# Patient Record
Sex: Female | Born: 1935 | Race: Black or African American | Hispanic: No | Smoking: Current every day smoker
Health system: Southern US, Community
[De-identification: ages and names within clinical notes are randomized; demographics above are authoritative.]

## PROBLEM LIST (undated history)

## (undated) DIAGNOSIS — E039 Hypothyroidism, unspecified: Secondary | ICD-10-CM

## (undated) DIAGNOSIS — J439 Emphysema, unspecified: Secondary | ICD-10-CM

## (undated) DIAGNOSIS — I071 Rheumatic tricuspid insufficiency: Secondary | ICD-10-CM

## (undated) DIAGNOSIS — I10 Essential (primary) hypertension: Secondary | ICD-10-CM

## (undated) DIAGNOSIS — I2510 Atherosclerotic heart disease of native coronary artery without angina pectoris: Secondary | ICD-10-CM

## (undated) DIAGNOSIS — I6529 Occlusion and stenosis of unspecified carotid artery: Secondary | ICD-10-CM

## (undated) DIAGNOSIS — I2781 Cor pulmonale (chronic): Secondary | ICD-10-CM

## (undated) DIAGNOSIS — I272 Pulmonary hypertension, unspecified: Secondary | ICD-10-CM

## (undated) DIAGNOSIS — I700 Atherosclerosis of aorta: Secondary | ICD-10-CM

## (undated) HISTORY — PX: THYROIDECTOMY, PARTIAL: SHX18

## (undated) HISTORY — DX: Hypothyroidism, unspecified: E03.9

## (undated) HISTORY — DX: Atherosclerotic heart disease of native coronary artery without angina pectoris: I25.10

## (undated) HISTORY — DX: Occlusion and stenosis of unspecified carotid artery: I65.29

## (undated) HISTORY — DX: Essential (primary) hypertension: I10

## (undated) HISTORY — PX: TUBAL LIGATION: SHX77

## (undated) HISTORY — DX: Pulmonary hypertension, unspecified: I27.20

## (undated) HISTORY — DX: Emphysema, unspecified: J43.9

## (undated) HISTORY — DX: Rheumatic tricuspid insufficiency: I07.1

## (undated) HISTORY — DX: Cor pulmonale (chronic): I27.81

## (undated) HISTORY — DX: Atherosclerosis of aorta: I70.0

---

## 2001-09-23 ENCOUNTER — Ambulatory Visit (HOSPITAL_COMMUNITY): Admission: RE | Admit: 2001-09-23 | Discharge: 2001-09-23 | Payer: Self-pay | Admitting: Family Medicine

## 2001-09-23 ENCOUNTER — Encounter: Payer: Self-pay | Admitting: Family Medicine

## 2001-10-26 ENCOUNTER — Ambulatory Visit (HOSPITAL_COMMUNITY): Admission: RE | Admit: 2001-10-26 | Discharge: 2001-10-26 | Payer: Self-pay | Admitting: General Surgery

## 2003-03-01 ENCOUNTER — Encounter: Payer: Self-pay | Admitting: Family Medicine

## 2003-03-01 ENCOUNTER — Ambulatory Visit (HOSPITAL_COMMUNITY): Admission: RE | Admit: 2003-03-01 | Discharge: 2003-03-01 | Payer: Self-pay | Admitting: Family Medicine

## 2004-04-22 ENCOUNTER — Ambulatory Visit (HOSPITAL_COMMUNITY): Admission: RE | Admit: 2004-04-22 | Discharge: 2004-04-22 | Payer: Self-pay | Admitting: Family Medicine

## 2005-04-24 ENCOUNTER — Ambulatory Visit (HOSPITAL_COMMUNITY): Admission: RE | Admit: 2005-04-24 | Discharge: 2005-04-24 | Payer: Self-pay | Admitting: Family Medicine

## 2005-08-31 ENCOUNTER — Ambulatory Visit (HOSPITAL_COMMUNITY): Admission: RE | Admit: 2005-08-31 | Discharge: 2005-08-31 | Payer: Self-pay | Admitting: Family Medicine

## 2006-05-04 ENCOUNTER — Ambulatory Visit (HOSPITAL_COMMUNITY): Admission: RE | Admit: 2006-05-04 | Discharge: 2006-05-04 | Payer: Self-pay | Admitting: Family Medicine

## 2008-10-25 ENCOUNTER — Ambulatory Visit (HOSPITAL_COMMUNITY): Admission: RE | Admit: 2008-10-25 | Discharge: 2008-10-25 | Payer: Self-pay | Admitting: Ophthalmology

## 2008-12-13 ENCOUNTER — Ambulatory Visit (HOSPITAL_COMMUNITY): Admission: RE | Admit: 2008-12-13 | Discharge: 2008-12-13 | Payer: Self-pay | Admitting: Ophthalmology

## 2009-07-21 ENCOUNTER — Emergency Department (HOSPITAL_COMMUNITY): Admission: EM | Admit: 2009-07-21 | Discharge: 2009-07-21 | Payer: Self-pay | Admitting: Emergency Medicine

## 2010-01-07 ENCOUNTER — Ambulatory Visit (HOSPITAL_COMMUNITY): Admission: RE | Admit: 2010-01-07 | Discharge: 2010-01-07 | Payer: Self-pay | Admitting: Family Medicine

## 2010-03-17 ENCOUNTER — Ambulatory Visit (HOSPITAL_COMMUNITY): Admission: RE | Admit: 2010-03-17 | Discharge: 2010-03-17 | Payer: Self-pay | Admitting: Family Medicine

## 2010-03-27 ENCOUNTER — Encounter (INDEPENDENT_AMBULATORY_CARE_PROVIDER_SITE_OTHER): Payer: Self-pay | Admitting: Cardiology

## 2010-03-27 ENCOUNTER — Ambulatory Visit (HOSPITAL_COMMUNITY)
Admission: RE | Admit: 2010-03-27 | Discharge: 2010-03-27 | Payer: Self-pay | Source: Home / Self Care | Admitting: Cardiology

## 2010-04-03 ENCOUNTER — Other Ambulatory Visit: Admission: RE | Admit: 2010-04-03 | Discharge: 2010-04-03 | Payer: Self-pay | Admitting: Family Medicine

## 2010-04-07 ENCOUNTER — Ambulatory Visit (HOSPITAL_COMMUNITY): Admission: RE | Admit: 2010-04-07 | Discharge: 2010-04-07 | Payer: Self-pay | Admitting: Family Medicine

## 2010-05-01 ENCOUNTER — Encounter (HOSPITAL_COMMUNITY): Admission: RE | Admit: 2010-05-01 | Discharge: 2010-05-31 | Payer: Self-pay | Admitting: Cardiology

## 2010-12-14 ENCOUNTER — Encounter: Payer: Self-pay | Admitting: Cardiology

## 2011-02-28 LAB — BASIC METABOLIC PANEL
Calcium: 9.5 mg/dL (ref 8.4–10.5)
Creatinine, Ser: 0.75 mg/dL (ref 0.4–1.2)
GFR calc Af Amer: >60 mL/min (ref >60)
GFR calc non Af Amer: >60 mL/min (ref >60)
Potassium: 4.2 mEq/L (ref 3.5–5.1)

## 2011-02-28 LAB — CBC
HCT: 41.4 % (ref 36.0–46.0)
MCHC: 34.6 g/dL (ref 30.0–36.0)
RBC: 4.4 MIL/uL (ref 3.87–5.11)
RDW: 14.4 % (ref 11.5–15.5)
WBC: 7 10*3/uL (ref 4.0–10.5)

## 2011-02-28 LAB — DIFFERENTIAL
Basophils Absolute: 0 10*3/uL (ref 0.0–0.1)
Eosinophils Absolute: 0 10*3/uL (ref 0.0–0.7)
Eosinophils Relative: 1 % (ref 0–5)
Neutro Abs: 4.2 10*3/uL (ref 1.7–7.7)

## 2011-03-09 LAB — HEMOGLOBIN AND HEMATOCRIT, BLOOD
HCT: 45.1 % (ref 36.0–46.0)
Hemoglobin: 15.2 g/dL — ABNORMAL HIGH (ref 12.0–15.0)

## 2011-03-09 LAB — BASIC METABOLIC PANEL
BUN: 13 mg/dL (ref 6–23)
CO2: 26 mEq/L (ref 19–32)
GFR calc non Af Amer: >60 mL/min (ref >60)
Glucose, Bld: 92 mg/dL (ref 70–99)

## 2011-04-10 NOTE — H&P (Signed)
[facility]  Patient:    Michelle Wall, Michelle Wall Visit Number: [id number] MRN: [id number]          Service Type: OUT Location: RAD Attending Physician:  Evlyn Courier Dictated by:   Elpidio Anis, M.D. Admit Date:  09/23/2001 Discharge Date: 09/23/2001                           History and Physical  HISTORY OF PRESENT ILLNESS:  A 77 year old female referred for screening colonoscopy.  She has a negative family history.  She has no personal history of diarrhea or rectal bleeding.  There is no history of colon cancer.  PAST HISTORY: 1. Positive for hypertension. 2. Osteoarthritis. 3. Hypothyroidism.  SURGERY:  Right thyroid lobectomy.  MEDICATIONS:  Plendil, Benicar and Synthroid.  ALLERGIES:  None.  PHYSICAL EXAMINATION:  VITAL SIGNS:  Blood pressure 130/82, pulse 68, respirations 18, weight 149 pounds, height 5 feet 4 inches.  HEENT:  Unremarkable except for full dentures.  NECK:  Supple, no JVD or bruits.  CHEST:  Clear to auscultation; no rales, rubs, rhonchi or wheezes.  HEART:  Regular rate and rhythm without murmur, gallop or rub.  ABDOMEN:  Soft, nontender, no masses.  EXTREMITIES:  No cyanosis, clubbing or edema.  NEUROLOGIC EXAM:  Nonfocal.  IMPRESSION: 1. Need for screening colonoscopy. 2. Hypertension. 3. Osteoarthritis. 4. Hypothyroidism.  PLAN:  Total colonoscopy. Dictated by:   Elpidio Anis, M.D. Attending Physician:  Evlyn Courier DD:  10/26/01 TD:  10/26/01 Job: [id number]

## 2011-07-28 ENCOUNTER — Ambulatory Visit (HOSPITAL_COMMUNITY)
Admission: RE | Admit: 2011-07-28 | Discharge: 2011-07-28 | Disposition: A | Discharge status: Home / Self Care | Payer: PRIVATE HEALTH INSURANCE | Source: Ambulatory Visit | Attending: Family Medicine | Admitting: Family Medicine

## 2011-07-28 ENCOUNTER — Other Ambulatory Visit (HOSPITAL_COMMUNITY): Payer: Self-pay | Admitting: Family Medicine

## 2011-07-28 DIAGNOSIS — R079 Chest pain, unspecified: Secondary | ICD-10-CM | POA: Insufficient documentation

## 2011-07-28 DIAGNOSIS — R0781 Pleurodynia: Secondary | ICD-10-CM

## 2011-08-26 LAB — HEMOGLOBIN AND HEMATOCRIT, BLOOD
HCT: 43.7
Hemoglobin: 14.8

## 2011-08-26 LAB — BASIC METABOLIC PANEL
Creatinine, Ser: 0.79
GFR calc Af Amer: >60
Potassium: 4
Sodium: 137

## 2013-04-26 ENCOUNTER — Ambulatory Visit (INDEPENDENT_AMBULATORY_CARE_PROVIDER_SITE_OTHER): Payer: PRIVATE HEALTH INSURANCE | Admitting: Cardiovascular Disease

## 2013-04-26 VITALS — BP 112/78 | HR 74 | Resp 20 | Ht 62.75 in | Wt 138.6 lb

## 2013-04-26 DIAGNOSIS — I079 Rheumatic tricuspid valve disease, unspecified: Secondary | ICD-10-CM

## 2013-04-26 DIAGNOSIS — I272 Pulmonary hypertension, unspecified: Secondary | ICD-10-CM

## 2013-04-26 DIAGNOSIS — F17200 Nicotine dependence, unspecified, uncomplicated: Secondary | ICD-10-CM

## 2013-04-26 DIAGNOSIS — Z720 Tobacco use: Secondary | ICD-10-CM

## 2013-04-26 DIAGNOSIS — I2789 Other specified pulmonary heart diseases: Secondary | ICD-10-CM

## 2013-04-26 DIAGNOSIS — I10 Essential (primary) hypertension: Secondary | ICD-10-CM

## 2013-04-26 DIAGNOSIS — J449 Chronic obstructive pulmonary disease, unspecified: Secondary | ICD-10-CM

## 2013-04-26 DIAGNOSIS — I071 Rheumatic tricuspid insufficiency: Secondary | ICD-10-CM

## 2013-04-26 NOTE — Patient Instructions (Addendum)
Your physician recommends that you schedule a follow-up appointment in: 1 year  

## 2013-05-05 ENCOUNTER — Encounter: Payer: Self-pay | Admitting: Cardiovascular Disease

## 2013-05-16 ENCOUNTER — Encounter: Payer: Self-pay | Admitting: Cardiovascular Disease

## 2013-05-16 DIAGNOSIS — I272 Pulmonary hypertension, unspecified: Secondary | ICD-10-CM | POA: Insufficient documentation

## 2013-05-16 DIAGNOSIS — J449 Chronic obstructive pulmonary disease, unspecified: Secondary | ICD-10-CM | POA: Insufficient documentation

## 2013-05-16 DIAGNOSIS — I10 Essential (primary) hypertension: Secondary | ICD-10-CM | POA: Insufficient documentation

## 2013-05-16 DIAGNOSIS — Z720 Tobacco use: Secondary | ICD-10-CM | POA: Insufficient documentation

## 2013-05-16 DIAGNOSIS — I071 Rheumatic tricuspid insufficiency: Secondary | ICD-10-CM | POA: Insufficient documentation

## 2013-05-16 NOTE — Assessment & Plan Note (Signed)
It appears that all of [name] cardiac problems traced back to what is a roughly 80-pack-year history of smoking. This has led to significant COPD, pulmonary arterial hypertension, cor pulmonale and tricuspid insufficiency. The most recent echocardiogram estimated that her PA pressure however was normal and at least by physical exam today her tricuspid insufficiency is quite unimpressive. We spent about 10 minutes discussing the importance of smoking cessation for Michelle Wall very charming he told me that at her age it is unlikely she will ever quit smoking. She again asked me about using nicotine patches especially last year. These are I think safe in her case but will only be a small assistance to what has to be a committed effort on her part to quit smoking.

## 2013-05-16 NOTE — Progress Notes (Signed)
Patient ID: Michelle Wall, female   DOB: 02/06/1933, 79 y.o.   MRN: [id number]     Reason for office visit Tricuspid insufficiency Hypertension  Michelle Wall is doing quite well. Unfortunately ,she continues to smoke her usual half a pack of cigarettes a day. She has smoked for well over 50 years. She feels quite well and specifically denies cough, dyspnea, wheezing, hemoptysis, chest pain, lower extremity edema or other cardiovascular complaints   No Known Allergies  Current Outpatient Prescriptions  Medication Sig Dispense Refill  . aspirin 81 MG tablet Take 81 mg by mouth daily.      Marland Kitchen CALCIUM-VITAMIN D PO Take 1 tablet by mouth 2 (two) times daily.      . hydrochlorothiazide (HYDRODIURIL) 12.5 MG tablet Take 12.5 mg by mouth daily.      Marland Kitchen levothyroxine (SYNTHROID, LEVOTHROID) 100 MCG tablet Take 100 mcg by mouth daily before breakfast.       No current facility-administered medications for this visit.    Past Medical History  Diagnosis Date  . Emphysema   . Pulmonary hypertension     moderate  last 2D echo EF greater than 55%  mild to moderate tricuspid insufficiency  . Cor pulmonale   . Tricuspid insufficiency     moderate to severe  . Carotid atherosclerosis   . Hypertension   . Hypothyroidism     Past Surgical History  Procedure Laterality Date  . Thyroidectomy, partial      No family history on file.  History   Social History  . Marital Status: Widowed    Spouse Name: N/A    Number of Children: 8  . Years of Education: N/A   Occupational History  . Not on file.   Social History Main Topics  . Smoking status: Current Every Day Smoker  . Smokeless tobacco: Not on file     Comment: smokes 1/2 pack a day,  Has smoked for over 50 years  . Alcohol Use: No  . Drug Use: No  . Sexually Active: Not on file   Other Topics Concern  . Not on file   Social History Narrative  . No narrative on file    Review of systems: The patient specifically denies  any chest pain at rest or with exertion, dyspnea at rest or with exertion, orthopnea, paroxysmal nocturnal dyspnea, syncope, palpitations, focal neurological deficits, intermittent claudication, lower extremity edema, unexplained weight gain, cough, hemoptysis or wheezing.  The patient also denies abdominal pain, nausea, vomiting, dysphagia, diarrhea, constipation, polyuria, polydipsia, dysuria, hematuria, frequency, urgency, abnormal bleeding or bruising, fever, chills, unexpected weight changes, mood swings, change in skin or hair texture, change in voice quality, auditory or visual problems, allergic reactions or rashes, new musculoskeletal complaints other than usual "aches and pains".   PHYSICAL EXAM BP 112/78  Pulse 74  Resp 20  Ht 5' 2.75" (1.594 m)  Wt 62.869 kg (138 lb 9.6 oz)  BMI 24.74 kg/m2  General: Alert, oriented x3, no distress Head: no evidence of trauma, PERRL, EOMI, no exophtalmos or lid lag, no myxedema, no xanthelasma; normal ears, nose and oropharynx Neck: normal jugular venous pulsations and no hepatojugular reflux; brisk carotid pulses without delay and no carotid bruits Chest: clear to auscultation, no signs of consolidation by percussion or palpation, normal fremitus, symmetrical and full respiratory excursions Cardiovascular: normal position and quality of the apical impulse, regular rhythm, normal first and second heart sounds, 2/6 holosystolic murmur at the lower left sternal border, rubs or gallops  Abdomen: no tenderness or distention, no masses by palpation, no abnormal pulsatility or arterial bruits, normal bowel sounds, no hepatosplenomegaly Extremities: no clubbing, cyanosis or edema; 2+ radial, ulnar and brachial pulses bilaterally; 2+ right femoral, posterior tibial and dorsalis pedis pulses; 2+ left femoral, posterior tibial and dorsalis pedis pulses; no subclavian or femoral bruits Neurological: grossly nonfocal   EKG: Sinus rhythm with early RS transition  in V2 possibly due to ventricular hypertrophy  BMET    Component Value Date/Time   NA 136 07/21/2009 1100   K 4.2 07/21/2009 1100   CL 103 07/21/2009 1100   CO2 25 07/21/2009 1100   GLUCOSE 93 07/21/2009 1100   BUN 10 07/21/2009 1100   CREATININE 0.75 07/21/2009 1100   CALCIUM 9.5 07/21/2009 1100   GFRNONAA >60 07/21/2009 1100   GFRAA  Value: >60        The eGFR has been calculated using the MDRD equation. This calculation has not been validated in all clinical situations. eGFR's persistently <60 mL/min signify possible Chronic Kidney Disease. 07/21/2009 1100     ASSESSMENT AND PLAN Tobacco abuse It appears that all of Michelle Wall cardiac problems traced back to what is a roughly 80-pack-year history of smoking. This has led to significant COPD, pulmonary arterial hypertension, cor pulmonale and tricuspid insufficiency. The most recent echocardiogram estimated that her PA pressure however was normal and at least by physical exam today her tricuspid insufficiency is quite unimpressive. We spent about 10 minutes discussing the importance of smoking cessation for Michelle Wall very charming he told me that at her age it is unlikely she will ever quit smoking. She again asked me about using nicotine patches especially last year. These are I think safe in her case but will only be a small assistance to what has to be a committed effort on her part to quit smoking.  Essential hypertension Well controlled; no changes made to her medications  No orders of the defined types were placed in this encounter.   Meds ordered this encounter  Medications  . CALCIUM-VITAMIN D PO    Sig: Take 1 tablet by mouth 2 (two) times daily.    Junious Silk, MD, [facility] [phone] office [phone] pager

## 2013-05-16 NOTE — Assessment & Plan Note (Signed)
Well controlled; no changes made to her medications

## 2014-05-19 ENCOUNTER — Emergency Department (HOSPITAL_COMMUNITY): Payer: PRIVATE HEALTH INSURANCE

## 2014-05-19 ENCOUNTER — Encounter (HOSPITAL_COMMUNITY): Payer: Self-pay | Admitting: Emergency Medicine

## 2014-05-19 ENCOUNTER — Emergency Department (HOSPITAL_COMMUNITY)
Admission: EM | Admit: 2014-05-19 | Discharge: 2014-05-19 | Disposition: A | Discharge status: Home / Self Care | Payer: PRIVATE HEALTH INSURANCE | Attending: Emergency Medicine | Admitting: Emergency Medicine

## 2014-05-19 DIAGNOSIS — Z79899 Other long term (current) drug therapy: Secondary | ICD-10-CM | POA: Diagnosis not present

## 2014-05-19 DIAGNOSIS — E039 Hypothyroidism, unspecified: Secondary | ICD-10-CM | POA: Diagnosis not present

## 2014-05-19 DIAGNOSIS — Z7982 Long term (current) use of aspirin: Secondary | ICD-10-CM | POA: Diagnosis not present

## 2014-05-19 DIAGNOSIS — K1120 Sialoadenitis, unspecified: Secondary | ICD-10-CM | POA: Insufficient documentation

## 2014-05-19 DIAGNOSIS — I10 Essential (primary) hypertension: Secondary | ICD-10-CM | POA: Diagnosis not present

## 2014-05-19 DIAGNOSIS — F17200 Nicotine dependence, unspecified, uncomplicated: Secondary | ICD-10-CM | POA: Insufficient documentation

## 2014-05-19 DIAGNOSIS — J438 Other emphysema: Secondary | ICD-10-CM | POA: Diagnosis not present

## 2014-05-19 DIAGNOSIS — R220 Localized swelling, mass and lump, head: Secondary | ICD-10-CM | POA: Diagnosis present

## 2014-05-19 LAB — BASIC METABOLIC PANEL
BUN: 16 mg/dL (ref 6–23)
CALCIUM: 10.1 mg/dL (ref 8.4–10.5)
CO2: 27 meq/L (ref 19–32)
CREATININE: 0.77 mg/dL (ref 0.50–1.10)
Chloride: 101 mEq/L (ref 96–112)
GFR calc non Af Amer: 78 mL/min — ABNORMAL LOW (ref >90)
Glucose, Bld: 83 mg/dL (ref 70–99)
Potassium: 3.7 mEq/L (ref 3.7–5.3)
SODIUM: 141 meq/L (ref 137–147)

## 2014-05-19 LAB — CBC WITH DIFFERENTIAL/PLATELET
BASOS ABS: 0 10*3/uL (ref 0.0–0.1)
BASOS PCT: 0 % (ref 0–1)
EOS PCT: 2 % (ref 0–5)
Eosinophils Absolute: 0.2 10*3/uL (ref 0.0–0.7)
HCT: 44.8 % (ref 36.0–46.0)
Hemoglobin: 15.4 g/dL — ABNORMAL HIGH (ref 12.0–15.0)
Lymphocytes Relative: 30 % (ref 12–46)
Lymphs Abs: 2.2 10*3/uL (ref 0.7–4.0)
MCH: 31.8 pg (ref 26.0–34.0)
MCHC: 34.4 g/dL (ref 30.0–36.0)
MCV: 92.4 fL (ref 78.0–100.0)
MONO ABS: 0.6 10*3/uL (ref 0.1–1.0)
Monocytes Relative: 8 % (ref 3–12)
NEUTROS ABS: 4.5 10*3/uL (ref 1.7–7.7)
Neutrophils Relative %: 60 % (ref 43–77)
Platelets: 218 10*3/uL (ref 150–400)
RBC: 4.85 MIL/uL (ref 3.87–5.11)
RDW: 13.7 % (ref 11.5–15.5)
WBC: 7.4 10*3/uL (ref 4.0–10.5)

## 2014-05-19 LAB — AMYLASE: Amylase: 338 U/L — ABNORMAL HIGH (ref 0–105)

## 2014-05-19 MED ORDER — CLINDAMYCIN HCL 300 MG PO CAPS: 600.0000 mg | ORAL_CAPSULE | Freq: Three times a day (TID) | ORAL | Status: DC

## 2014-05-19 MED ORDER — IOHEXOL 300 MG/ML  SOLN
80.0000 mL | Freq: Once | INTRAMUSCULAR | Status: AC | PRN
  Administered 2014-05-19: 80 mL via INTRAVENOUS

## 2014-05-19 MED ORDER — CLINDAMYCIN PHOSPHATE 900 MG/50ML IV SOLN
900.0000 mg | Freq: Once | INTRAVENOUS | Status: AC
  Administered 2014-05-19: 900 mg via INTRAVENOUS
  Filled 2014-05-19: qty 50

## 2014-05-19 NOTE — ED Provider Notes (Signed)
CSN: [id number]     Arrival date & time 05/19/14  1403 History   First MD Initiated Contact with Patient 05/19/14 1539     Chief Complaint  Patient presents with  . Facial Swelling    (Consider location/radiation/quality/duration/timing/severity/associated sxs/prior Treatment) HPI  80 year old female with history of hypertension, hypothyroidism, pulmonary hypertension, cor pulmonale, and chronic tobacco abuse presenting with right neck swelling. Patient states that yesterday evening the right side of her neck and face started to swell. States this has happened before about once per year. She's not had any trouble breathing, shortness of breath, difficulty swallowing, difficulty hearing, difficulty seeing. The area is not painful other than feeling tight when it was at its most swollen. She states a little while ago she coughed out a white better tasting stone like item, at which point the swelling began to get better. She denies using any new medications or eating any new foods.  Past Medical History  Diagnosis Date  . Emphysema   . Pulmonary hypertension     moderate  last 2D echo EF greater than 55%  mild to moderate tricuspid insufficiency  . Cor pulmonale   . Tricuspid insufficiency     moderate to severe  . Carotid atherosclerosis   . Hypertension   . Hypothyroidism    Past Surgical History  Procedure Laterality Date  . Thyroidectomy, partial     Family History  Problem Relation Age of Onset  . Cancer Other    History  Substance Use Topics  . Smoking status: Current Every Day Smoker -- 1.00 packs/day for 60 years    Types: Cigarettes  . Smokeless tobacco: Never Used  . Alcohol Use: No   OB History   Grav Para Term Preterm Abortions TAB SAB Ect Mult Living   8 8 8       7      Review of Systems  Constitutional: Negative for fever.  HENT: Positive for facial swelling. Negative for sore throat, trouble swallowing and voice change.   Eyes: Negative for visual  disturbance.  Respiratory: Negative for shortness of breath.   Cardiovascular: Negative for chest pain and leg swelling.  Gastrointestinal: Negative for abdominal pain.  Genitourinary: Negative for difficulty urinating.  Musculoskeletal: Positive for neck pain.  All other systems reviewed and are negative.     Allergies  Review of patient's allergies indicates no known allergies.  Home Medications   Prior to Admission medications   Medication Sig Start Date End Date Taking? Authorizing Provider  aspirin EC 81 MG tablet Take 81 mg by mouth daily.   Yes Historical Provider, MD  CALCIUM-VITAMIN D PO Take 1 tablet by mouth 2 (two) times daily.   Yes Historical Provider, MD  hydrochlorothiazide (HYDRODIURIL) 12.5 MG tablet Take 12.5 mg by mouth daily.   Yes Historical Provider, MD  latanoprost (XALATAN) 0.005 % ophthalmic solution Place 1 drop into both eyes at bedtime. 04/17/14  Yes Historical Provider, MD  levothyroxine (SYNTHROID, LEVOTHROID) 150 MCG tablet Take 150 mcg by mouth daily before breakfast.   Yes Historical Provider, MD  lisinopril (PRINIVIL,ZESTRIL) 20 MG tablet Take 20 mg by mouth 2 (two) times daily.   Yes Historical Provider, MD  verapamil (CALAN-SR) 240 MG CR tablet Take 240 mg by mouth 2 (two) times daily.   Yes Historical Provider, MD   BP 161/77  Pulse 68  Temp(Src) 98.5 F (36.9 C) (Oral)  Resp 16  SpO2 98% Physical Exam  Constitutional: She is oriented to person, place, and  time. She appears well-developed and well-nourished. No distress.  HENT:  Head: Normocephalic and atraumatic.  Mouth/Throat: Oropharynx is clear and moist.  3-4 cm oval shaped area of focal swelling on R anterior aspect of neck. Mildly tender to palpation. Mobile. No erythema or skin breakdown. No warmth. No swelling of lips or face.  Eyes: EOM are normal.  Pupils reactive bilaterally. No scleral injection. EOMI.  Neck: Normal range of motion. No spinous process tenderness present. No  rigidity. Edema present. No erythema present. No Brudzinski's sign and no Kernig's sign noted.    Cardiovascular: Normal rate, regular rhythm and normal heart sounds.   No murmur heard. Pulmonary/Chest: Effort normal and breath sounds normal. No respiratory distress. She has no wheezes. She has no rales. She exhibits no tenderness.  Abdominal: Soft. Bowel sounds are normal. She exhibits no distension and no mass. There is no tenderness. There is no rebound and no guarding.  Neurological: She is alert and oriented to person, place, and time. No cranial nerve deficit.  Skin: Skin is warm and dry. She is not diaphoretic.  Psychiatric: She has a normal mood and affect. Her behavior is normal.    ED Course  Procedures (including critical care time) Labs Review Labs Reviewed  CBC WITH DIFFERENTIAL - Abnormal; Notable for the following:    Hemoglobin 15.4 (*)    All other components within normal limits  BASIC METABOLIC PANEL - Abnormal; Notable for the following:    GFR calc non Af Amer 78 (*)    All other components within normal limits  AMYLASE - Abnormal; Notable for the following:    Amylase 338 (*)    All other components within normal limits    Imaging Review No results found.   EKG Interpretation None      MDM   Final diagnoses:  Sialoadenitis of submandibular gland    80 year old female with right sided neck swelling, which per patient is actually improving now. No signs of airway compromise. This does not seem to be angioedema, although of note she is [place] and on an ACE inhibitor. Will check basic labs including CBC with differential, BMET, and amylase. Suspect this is a swollen gland.  Update: No leukocytosis to suggest infection. Her amylase is elevated. Will obtain CT scan of neck with contrast to further evaluate.  Update: CT scan showing submandibular sialoadenitis. Suspect this was related to a salivary gland stone, which has passed. She is clinically  well appearing, afebrile, and has no respiratory compromise or difficulty swallowing. Will treat with IV clindamycin x1 here in the emergency room, and then prescribe clindamycin to be taken orally as an outpatient. Patient was given information for ENT office followup, and instructed to call and schedule an appointment. She is agreeable to this plan.  Levert FeinsteinBrittany Anjani Feuerborn, MD Family Medicine PGY-2   Latrelle DodrillBrittany J Jolicia Delira, MD 05/19/14 2040  Latrelle DodrillBrittany J Neale Marzette, MD 05/19/14 [phone]

## 2014-05-19 NOTE — Discharge Instructions (Signed)
You were diagnosed with an infection of your submandibular gland. We have given a dose of IV antibiotics here in the emergency room. You will need to continue to take clindamycin by mouth after you go home. Please followup with the ENT doctor, the contact information is above. Return to the emergency room if you have any fevers, worsening swelling, difficulty breathing, inability to swallow, or any other concerns.

## 2014-05-19 NOTE — ED Notes (Signed)
Dr. McIntyre at bedside to assess pt.

## 2014-05-19 NOTE — ED Notes (Signed)
Patient c/o swelling to right side of neck. Per patient appeared yesterday. Denies any pain. Per patient has had this happen "a couple times before but swelling would go away on it's own." Denies any fevers, difficulty swallowing or breathing.

## 2014-05-20 NOTE — ED Provider Notes (Signed)
I saw and evaluated the patient, reviewed the resident's note and I agree with the findings and plan.   .Face to face Exam:  General:  Awake HEENT:  Atraumatic large neck swelling as noted Resp:  Normal effort Abd:  Nondistended Neuro:No focal weakness  Nelia Shiobert L Beaton, MD 05/20/14 1319

## 2014-09-24 ENCOUNTER — Encounter (HOSPITAL_COMMUNITY): Payer: Self-pay | Admitting: Emergency Medicine

## 2014-12-24 DIAGNOSIS — F17210 Nicotine dependence, cigarettes, uncomplicated: Secondary | ICD-10-CM | POA: Diagnosis not present

## 2014-12-24 DIAGNOSIS — Z0000 Encounter for general adult medical examination without abnormal findings: Secondary | ICD-10-CM | POA: Diagnosis not present

## 2014-12-24 DIAGNOSIS — I10 Essential (primary) hypertension: Secondary | ICD-10-CM | POA: Diagnosis not present

## 2015-02-01 DIAGNOSIS — E049 Nontoxic goiter, unspecified: Secondary | ICD-10-CM | POA: Diagnosis not present

## 2015-02-08 DIAGNOSIS — E063 Autoimmune thyroiditis: Secondary | ICD-10-CM | POA: Diagnosis not present

## 2015-02-08 DIAGNOSIS — J449 Chronic obstructive pulmonary disease, unspecified: Secondary | ICD-10-CM | POA: Diagnosis not present

## 2015-02-08 DIAGNOSIS — E042 Nontoxic multinodular goiter: Secondary | ICD-10-CM | POA: Diagnosis not present

## 2015-02-08 DIAGNOSIS — I10 Essential (primary) hypertension: Secondary | ICD-10-CM | POA: Diagnosis not present

## 2015-02-08 DIAGNOSIS — E890 Postprocedural hypothyroidism: Secondary | ICD-10-CM | POA: Diagnosis not present

## 2015-02-08 DIAGNOSIS — F17210 Nicotine dependence, cigarettes, uncomplicated: Secondary | ICD-10-CM | POA: Diagnosis not present

## 2015-04-08 DIAGNOSIS — J069 Acute upper respiratory infection, unspecified: Secondary | ICD-10-CM | POA: Diagnosis not present

## 2015-04-08 DIAGNOSIS — I10 Essential (primary) hypertension: Secondary | ICD-10-CM | POA: Diagnosis not present

## 2015-04-08 DIAGNOSIS — E785 Hyperlipidemia, unspecified: Secondary | ICD-10-CM | POA: Diagnosis not present

## 2015-04-08 DIAGNOSIS — E039 Hypothyroidism, unspecified: Secondary | ICD-10-CM | POA: Diagnosis not present

## 2015-04-29 DIAGNOSIS — H4011X2 Primary open-angle glaucoma, moderate stage: Secondary | ICD-10-CM | POA: Diagnosis not present

## 2015-04-29 DIAGNOSIS — H52223 Regular astigmatism, bilateral: Secondary | ICD-10-CM | POA: Diagnosis not present

## 2015-04-29 DIAGNOSIS — H524 Presbyopia: Secondary | ICD-10-CM | POA: Diagnosis not present

## 2015-04-29 DIAGNOSIS — H5202 Hypermetropia, left eye: Secondary | ICD-10-CM | POA: Diagnosis not present

## 2015-07-08 DIAGNOSIS — N182 Chronic kidney disease, stage 2 (mild): Secondary | ICD-10-CM | POA: Diagnosis not present

## 2015-07-08 DIAGNOSIS — I10 Essential (primary) hypertension: Secondary | ICD-10-CM | POA: Diagnosis not present

## 2015-07-08 DIAGNOSIS — E039 Hypothyroidism, unspecified: Secondary | ICD-10-CM | POA: Diagnosis not present

## 2015-10-07 DIAGNOSIS — E039 Hypothyroidism, unspecified: Secondary | ICD-10-CM | POA: Diagnosis not present

## 2015-10-07 DIAGNOSIS — I10 Essential (primary) hypertension: Secondary | ICD-10-CM | POA: Diagnosis not present

## 2015-10-07 DIAGNOSIS — Z23 Encounter for immunization: Secondary | ICD-10-CM | POA: Diagnosis not present

## 2015-10-07 DIAGNOSIS — Z6827 Body mass index (BMI) 27.0-27.9, adult: Secondary | ICD-10-CM | POA: Diagnosis not present

## 2015-10-29 DIAGNOSIS — H401122 Primary open-angle glaucoma, left eye, moderate stage: Secondary | ICD-10-CM | POA: Diagnosis not present

## 2015-10-29 DIAGNOSIS — H401113 Primary open-angle glaucoma, right eye, severe stage: Secondary | ICD-10-CM | POA: Diagnosis not present

## 2016-01-06 DIAGNOSIS — I10 Essential (primary) hypertension: Secondary | ICD-10-CM | POA: Diagnosis not present

## 2016-01-31 DIAGNOSIS — E890 Postprocedural hypothyroidism: Secondary | ICD-10-CM | POA: Diagnosis not present

## 2016-01-31 DIAGNOSIS — E063 Autoimmune thyroiditis: Secondary | ICD-10-CM | POA: Diagnosis not present

## 2016-02-07 DIAGNOSIS — I10 Essential (primary) hypertension: Secondary | ICD-10-CM | POA: Diagnosis not present

## 2016-02-07 DIAGNOSIS — E063 Autoimmune thyroiditis: Secondary | ICD-10-CM | POA: Diagnosis not present

## 2016-02-07 DIAGNOSIS — J449 Chronic obstructive pulmonary disease, unspecified: Secondary | ICD-10-CM | POA: Diagnosis not present

## 2016-02-07 DIAGNOSIS — E042 Nontoxic multinodular goiter: Secondary | ICD-10-CM | POA: Diagnosis not present

## 2016-02-07 DIAGNOSIS — F17210 Nicotine dependence, cigarettes, uncomplicated: Secondary | ICD-10-CM | POA: Diagnosis not present

## 2016-02-07 DIAGNOSIS — E785 Hyperlipidemia, unspecified: Secondary | ICD-10-CM | POA: Diagnosis not present

## 2016-02-07 DIAGNOSIS — E890 Postprocedural hypothyroidism: Secondary | ICD-10-CM | POA: Diagnosis not present

## 2016-04-13 DIAGNOSIS — E785 Hyperlipidemia, unspecified: Secondary | ICD-10-CM | POA: Diagnosis not present

## 2016-04-13 DIAGNOSIS — Z720 Tobacco use: Secondary | ICD-10-CM | POA: Diagnosis not present

## 2016-04-13 DIAGNOSIS — I10 Essential (primary) hypertension: Secondary | ICD-10-CM | POA: Diagnosis not present

## 2016-04-28 DIAGNOSIS — Z961 Presence of intraocular lens: Secondary | ICD-10-CM | POA: Diagnosis not present

## 2016-04-28 DIAGNOSIS — H401122 Primary open-angle glaucoma, left eye, moderate stage: Secondary | ICD-10-CM | POA: Diagnosis not present

## 2016-04-28 DIAGNOSIS — Z9849 Cataract extraction status, unspecified eye: Secondary | ICD-10-CM | POA: Diagnosis not present

## 2016-04-28 DIAGNOSIS — H401113 Primary open-angle glaucoma, right eye, severe stage: Secondary | ICD-10-CM | POA: Diagnosis not present

## 2016-07-22 DIAGNOSIS — I10 Essential (primary) hypertension: Secondary | ICD-10-CM | POA: Diagnosis not present

## 2016-07-22 DIAGNOSIS — Z720 Tobacco use: Secondary | ICD-10-CM | POA: Diagnosis not present

## 2016-07-22 DIAGNOSIS — E039 Hypothyroidism, unspecified: Secondary | ICD-10-CM | POA: Diagnosis not present

## 2016-07-22 DIAGNOSIS — Z0000 Encounter for general adult medical examination without abnormal findings: Secondary | ICD-10-CM | POA: Diagnosis not present

## 2016-11-09 DIAGNOSIS — H401122 Primary open-angle glaucoma, left eye, moderate stage: Secondary | ICD-10-CM | POA: Diagnosis not present

## 2016-11-09 DIAGNOSIS — H401113 Primary open-angle glaucoma, right eye, severe stage: Secondary | ICD-10-CM | POA: Diagnosis not present

## 2016-11-18 DIAGNOSIS — Z720 Tobacco use: Secondary | ICD-10-CM | POA: Diagnosis not present

## 2016-11-18 DIAGNOSIS — E039 Hypothyroidism, unspecified: Secondary | ICD-10-CM | POA: Diagnosis not present

## 2016-11-18 DIAGNOSIS — I10 Essential (primary) hypertension: Secondary | ICD-10-CM | POA: Diagnosis not present

## 2016-11-18 DIAGNOSIS — Z23 Encounter for immunization: Secondary | ICD-10-CM | POA: Diagnosis not present

## 2017-01-29 DIAGNOSIS — E890 Postprocedural hypothyroidism: Secondary | ICD-10-CM | POA: Diagnosis not present

## 2017-01-29 DIAGNOSIS — E063 Autoimmune thyroiditis: Secondary | ICD-10-CM | POA: Diagnosis not present

## 2017-02-05 DIAGNOSIS — I10 Essential (primary) hypertension: Secondary | ICD-10-CM | POA: Diagnosis not present

## 2017-02-05 DIAGNOSIS — E890 Postprocedural hypothyroidism: Secondary | ICD-10-CM | POA: Diagnosis not present

## 2017-02-05 DIAGNOSIS — E042 Nontoxic multinodular goiter: Secondary | ICD-10-CM | POA: Diagnosis not present

## 2017-03-22 DIAGNOSIS — E039 Hypothyroidism, unspecified: Secondary | ICD-10-CM | POA: Diagnosis not present

## 2017-03-22 DIAGNOSIS — E785 Hyperlipidemia, unspecified: Secondary | ICD-10-CM | POA: Diagnosis not present

## 2017-03-22 DIAGNOSIS — I10 Essential (primary) hypertension: Secondary | ICD-10-CM | POA: Diagnosis not present

## 2017-05-03 DIAGNOSIS — E063 Autoimmune thyroiditis: Secondary | ICD-10-CM | POA: Diagnosis not present

## 2017-05-03 DIAGNOSIS — E042 Nontoxic multinodular goiter: Secondary | ICD-10-CM | POA: Diagnosis not present

## 2017-05-03 DIAGNOSIS — I10 Essential (primary) hypertension: Secondary | ICD-10-CM | POA: Diagnosis not present

## 2017-05-03 DIAGNOSIS — E890 Postprocedural hypothyroidism: Secondary | ICD-10-CM | POA: Diagnosis not present

## 2017-05-10 DIAGNOSIS — I10 Essential (primary) hypertension: Secondary | ICD-10-CM | POA: Diagnosis not present

## 2017-05-10 DIAGNOSIS — E042 Nontoxic multinodular goiter: Secondary | ICD-10-CM | POA: Diagnosis not present

## 2017-05-10 DIAGNOSIS — J449 Chronic obstructive pulmonary disease, unspecified: Secondary | ICD-10-CM | POA: Diagnosis not present

## 2017-05-25 DIAGNOSIS — H401113 Primary open-angle glaucoma, right eye, severe stage: Secondary | ICD-10-CM | POA: Diagnosis not present

## 2017-05-25 DIAGNOSIS — H401122 Primary open-angle glaucoma, left eye, moderate stage: Secondary | ICD-10-CM | POA: Diagnosis not present

## 2017-08-04 DIAGNOSIS — E785 Hyperlipidemia, unspecified: Secondary | ICD-10-CM | POA: Diagnosis not present

## 2017-08-04 DIAGNOSIS — Z720 Tobacco use: Secondary | ICD-10-CM | POA: Diagnosis not present

## 2017-08-04 DIAGNOSIS — I10 Essential (primary) hypertension: Secondary | ICD-10-CM | POA: Diagnosis not present

## 2017-08-04 DIAGNOSIS — E038 Other specified hypothyroidism: Secondary | ICD-10-CM | POA: Diagnosis not present

## 2017-08-11 DIAGNOSIS — E890 Postprocedural hypothyroidism: Secondary | ICD-10-CM | POA: Diagnosis not present

## 2017-08-11 DIAGNOSIS — E063 Autoimmune thyroiditis: Secondary | ICD-10-CM | POA: Diagnosis not present

## 2017-08-18 DIAGNOSIS — J449 Chronic obstructive pulmonary disease, unspecified: Secondary | ICD-10-CM | POA: Diagnosis not present

## 2017-08-18 DIAGNOSIS — I10 Essential (primary) hypertension: Secondary | ICD-10-CM | POA: Diagnosis not present

## 2017-08-18 DIAGNOSIS — E785 Hyperlipidemia, unspecified: Secondary | ICD-10-CM | POA: Diagnosis not present

## 2017-08-18 DIAGNOSIS — E890 Postprocedural hypothyroidism: Secondary | ICD-10-CM | POA: Diagnosis not present

## 2017-11-03 DIAGNOSIS — Z720 Tobacco use: Secondary | ICD-10-CM | POA: Diagnosis not present

## 2017-11-03 DIAGNOSIS — E785 Hyperlipidemia, unspecified: Secondary | ICD-10-CM | POA: Diagnosis not present

## 2017-11-03 DIAGNOSIS — I10 Essential (primary) hypertension: Secondary | ICD-10-CM | POA: Diagnosis not present

## 2017-11-10 DIAGNOSIS — H401113 Primary open-angle glaucoma, right eye, severe stage: Secondary | ICD-10-CM | POA: Diagnosis not present

## 2017-11-10 DIAGNOSIS — H401122 Primary open-angle glaucoma, left eye, moderate stage: Secondary | ICD-10-CM | POA: Diagnosis not present

## 2018-02-07 DIAGNOSIS — E039 Hypothyroidism, unspecified: Secondary | ICD-10-CM | POA: Diagnosis not present

## 2018-02-07 DIAGNOSIS — Z720 Tobacco use: Secondary | ICD-10-CM | POA: Diagnosis not present

## 2018-02-07 DIAGNOSIS — I10 Essential (primary) hypertension: Secondary | ICD-10-CM | POA: Diagnosis not present

## 2018-02-07 DIAGNOSIS — K1321 Leukoplakia of oral mucosa, including tongue: Secondary | ICD-10-CM | POA: Diagnosis not present

## 2018-02-08 DIAGNOSIS — E890 Postprocedural hypothyroidism: Secondary | ICD-10-CM | POA: Diagnosis not present

## 2018-02-08 DIAGNOSIS — E063 Autoimmune thyroiditis: Secondary | ICD-10-CM | POA: Diagnosis not present

## 2018-02-08 DIAGNOSIS — I10 Essential (primary) hypertension: Secondary | ICD-10-CM | POA: Diagnosis not present

## 2018-02-15 DIAGNOSIS — E042 Nontoxic multinodular goiter: Secondary | ICD-10-CM | POA: Diagnosis not present

## 2018-02-15 DIAGNOSIS — E063 Autoimmune thyroiditis: Secondary | ICD-10-CM | POA: Diagnosis not present

## 2018-02-15 DIAGNOSIS — I10 Essential (primary) hypertension: Secondary | ICD-10-CM | POA: Diagnosis not present

## 2018-02-15 DIAGNOSIS — J449 Chronic obstructive pulmonary disease, unspecified: Secondary | ICD-10-CM | POA: Diagnosis not present

## 2018-02-15 DIAGNOSIS — E785 Hyperlipidemia, unspecified: Secondary | ICD-10-CM | POA: Diagnosis not present

## 2018-02-15 DIAGNOSIS — E890 Postprocedural hypothyroidism: Secondary | ICD-10-CM | POA: Diagnosis not present

## 2018-05-09 DIAGNOSIS — Z720 Tobacco use: Secondary | ICD-10-CM | POA: Diagnosis not present

## 2018-05-09 DIAGNOSIS — I119 Hypertensive heart disease without heart failure: Secondary | ICD-10-CM | POA: Diagnosis not present

## 2018-05-09 DIAGNOSIS — I10 Essential (primary) hypertension: Secondary | ICD-10-CM | POA: Diagnosis not present

## 2018-05-09 DIAGNOSIS — K1321 Leukoplakia of oral mucosa, including tongue: Secondary | ICD-10-CM | POA: Diagnosis not present

## 2018-05-11 DIAGNOSIS — H401113 Primary open-angle glaucoma, right eye, severe stage: Secondary | ICD-10-CM | POA: Diagnosis not present

## 2018-05-11 DIAGNOSIS — H401122 Primary open-angle glaucoma, left eye, moderate stage: Secondary | ICD-10-CM | POA: Diagnosis not present

## 2018-06-14 ENCOUNTER — Emergency Department (HOSPITAL_COMMUNITY): Payer: Medicare Other

## 2018-06-14 ENCOUNTER — Other Ambulatory Visit: Payer: Self-pay

## 2018-06-14 ENCOUNTER — Encounter (HOSPITAL_COMMUNITY)
Admission: EM | Disposition: A | Discharge status: Home Health | Payer: Self-pay | Source: Home / Self Care | Attending: Internal Medicine

## 2018-06-14 ENCOUNTER — Emergency Department (HOSPITAL_COMMUNITY): Payer: Medicare Other | Admitting: Anesthesiology

## 2018-06-14 ENCOUNTER — Inpatient Hospital Stay (HOSPITAL_COMMUNITY)
Admission: EM | Admit: 2018-06-14 | Discharge: 2018-06-22 | DRG: 853 | Disposition: A | Discharge status: Home Health | Payer: Medicare Other | Attending: Internal Medicine | Admitting: Internal Medicine

## 2018-06-14 ENCOUNTER — Encounter (HOSPITAL_COMMUNITY): Payer: Self-pay | Admitting: Emergency Medicine

## 2018-06-14 DIAGNOSIS — I2781 Cor pulmonale (chronic): Secondary | ICD-10-CM | POA: Diagnosis present

## 2018-06-14 DIAGNOSIS — R188 Other ascites: Secondary | ICD-10-CM | POA: Diagnosis not present

## 2018-06-14 DIAGNOSIS — K659 Peritonitis, unspecified: Secondary | ICD-10-CM | POA: Diagnosis not present

## 2018-06-14 DIAGNOSIS — K5720 Diverticulitis of large intestine with perforation and abscess without bleeding: Secondary | ICD-10-CM | POA: Diagnosis not present

## 2018-06-14 DIAGNOSIS — I071 Rheumatic tricuspid insufficiency: Secondary | ICD-10-CM | POA: Diagnosis not present

## 2018-06-14 DIAGNOSIS — I272 Pulmonary hypertension, unspecified: Secondary | ICD-10-CM

## 2018-06-14 DIAGNOSIS — I6529 Occlusion and stenosis of unspecified carotid artery: Secondary | ICD-10-CM | POA: Diagnosis present

## 2018-06-14 DIAGNOSIS — R109 Unspecified abdominal pain: Secondary | ICD-10-CM | POA: Diagnosis not present

## 2018-06-14 DIAGNOSIS — K567 Ileus, unspecified: Secondary | ICD-10-CM | POA: Diagnosis not present

## 2018-06-14 DIAGNOSIS — Z79890 Hormone replacement therapy (postmenopausal): Secondary | ICD-10-CM

## 2018-06-14 DIAGNOSIS — T17890A Other foreign object in other parts of respiratory tract causing asphyxiation, initial encounter: Secondary | ICD-10-CM | POA: Diagnosis not present

## 2018-06-14 DIAGNOSIS — H409 Unspecified glaucoma: Secondary | ICD-10-CM | POA: Diagnosis present

## 2018-06-14 DIAGNOSIS — Z452 Encounter for adjustment and management of vascular access device: Secondary | ICD-10-CM | POA: Diagnosis not present

## 2018-06-14 DIAGNOSIS — Z7982 Long term (current) use of aspirin: Secondary | ICD-10-CM | POA: Diagnosis not present

## 2018-06-14 DIAGNOSIS — E039 Hypothyroidism, unspecified: Secondary | ICD-10-CM | POA: Diagnosis not present

## 2018-06-14 DIAGNOSIS — E890 Postprocedural hypothyroidism: Secondary | ICD-10-CM | POA: Diagnosis not present

## 2018-06-14 DIAGNOSIS — Z79899 Other long term (current) drug therapy: Secondary | ICD-10-CM | POA: Diagnosis not present

## 2018-06-14 DIAGNOSIS — K658 Other peritonitis: Secondary | ICD-10-CM | POA: Diagnosis not present

## 2018-06-14 DIAGNOSIS — R000 Tachycardia, unspecified: Secondary | ICD-10-CM | POA: Diagnosis not present

## 2018-06-14 DIAGNOSIS — K631 Perforation of intestine (nontraumatic): Secondary | ICD-10-CM | POA: Diagnosis present

## 2018-06-14 DIAGNOSIS — I10 Essential (primary) hypertension: Secondary | ICD-10-CM | POA: Diagnosis present

## 2018-06-14 DIAGNOSIS — F17210 Nicotine dependence, cigarettes, uncomplicated: Secondary | ICD-10-CM | POA: Diagnosis present

## 2018-06-14 DIAGNOSIS — Z23 Encounter for immunization: Secondary | ICD-10-CM

## 2018-06-14 DIAGNOSIS — I2729 Other secondary pulmonary hypertension: Secondary | ICD-10-CM | POA: Diagnosis present

## 2018-06-14 DIAGNOSIS — J449 Chronic obstructive pulmonary disease, unspecified: Secondary | ICD-10-CM | POA: Diagnosis not present

## 2018-06-14 DIAGNOSIS — A419 Sepsis, unspecified organism: Secondary | ICD-10-CM | POA: Diagnosis not present

## 2018-06-14 DIAGNOSIS — E876 Hypokalemia: Secondary | ICD-10-CM | POA: Diagnosis not present

## 2018-06-14 DIAGNOSIS — I728 Aneurysm of other specified arteries: Secondary | ICD-10-CM | POA: Diagnosis not present

## 2018-06-14 DIAGNOSIS — Z720 Tobacco use: Secondary | ICD-10-CM | POA: Diagnosis not present

## 2018-06-14 DIAGNOSIS — J9811 Atelectasis: Secondary | ICD-10-CM | POA: Diagnosis not present

## 2018-06-14 DIAGNOSIS — Z01818 Encounter for other preprocedural examination: Secondary | ICD-10-CM

## 2018-06-14 HISTORY — PX: PARTIAL COLECTOMY: SHX5273

## 2018-06-14 HISTORY — PX: COLOSTOMY: SHX63

## 2018-06-14 LAB — COMPREHENSIVE METABOLIC PANEL
ALT: 13 U/L (ref 0–44)
AST: 17 U/L (ref 15–41)
Albumin: 3.9 g/dL (ref 3.5–5.0)
Alkaline Phosphatase: 41 U/L (ref 38–126)
Anion gap: 10 (ref 5–15)
BUN: 14 mg/dL (ref 8–23)
CO2: 25 mmol/L (ref 22–32)
Calcium: 9.2 mg/dL (ref 8.9–10.3)
Chloride: 100 mmol/L (ref 98–111)
Creatinine, Ser: 0.75 mg/dL (ref 0.44–1.00)
GFR calc non Af Amer: >60 mL/min (ref >60)
Glucose, Bld: 140 mg/dL — ABNORMAL HIGH (ref 70–99)
Potassium: 3.1 mmol/L — ABNORMAL LOW (ref 3.5–5.1)
Sodium: 135 mmol/L (ref 135–145)
Total Bilirubin: 0.9 mg/dL (ref 0.3–1.2)
Total Protein: 7.7 g/dL (ref 6.5–8.1)

## 2018-06-14 LAB — CBC
HCT: 46.3 % — ABNORMAL HIGH (ref 36.0–46.0)
Hemoglobin: 16 g/dL — ABNORMAL HIGH (ref 12.0–15.0)
MCH: 31.9 pg (ref 26.0–34.0)
MCHC: 34.6 g/dL (ref 30.0–36.0)
MCV: 92.2 fL (ref 78.0–100.0)
PLATELETS: 231 10*3/uL (ref 150–400)
RBC: 5.02 MIL/uL (ref 3.87–5.11)
RDW: 14.2 % (ref 11.5–15.5)
WBC: 6.1 10*3/uL (ref 4.0–10.5)

## 2018-06-14 LAB — PROTIME-INR
INR: 1.32
Prothrombin Time: 16.3 seconds — ABNORMAL HIGH (ref 11.4–15.2)

## 2018-06-14 LAB — APTT: APTT: 24 s (ref 24–36)

## 2018-06-14 LAB — TYPE AND SCREEN
ABO/RH(D): O POS
Antibody Screen: NEGATIVE

## 2018-06-14 LAB — LIPASE, BLOOD: Lipase: 28 U/L (ref 11–51)

## 2018-06-14 LAB — LACTIC ACID, PLASMA: Lactic Acid, Venous: 3.7 mmol/L (ref 0.5–1.9)

## 2018-06-14 SURGERY — COLECTOMY, PARTIAL
Anesthesia: General | Site: Abdomen

## 2018-06-14 MED ORDER — 0.9 % SODIUM CHLORIDE (POUR BTL) OPTIME
TOPICAL | Status: DC | PRN
  Administered 2018-06-14: 1000 mL

## 2018-06-14 MED ORDER — PHENYLEPHRINE HCL 10 MG/ML IJ SOLN
INTRAMUSCULAR | Status: DC | PRN
  Administered 2018-06-14: 200 ug via INTRAVENOUS
  Administered 2018-06-14: 100 ug via INTRAVENOUS
  Administered 2018-06-14: 300 ug via INTRAVENOUS
  Administered 2018-06-14: 200 ug via INTRAVENOUS
  Administered 2018-06-14: 300 ug via INTRAVENOUS
  Administered 2018-06-14: 100 ug via INTRAVENOUS
  Administered 2018-06-14: 300 ug via INTRAVENOUS
  Administered 2018-06-14: 100 ug via INTRAVENOUS

## 2018-06-14 MED ORDER — FENTANYL CITRATE (PF) 100 MCG/2ML IJ SOLN
50.0000 ug | INTRAMUSCULAR | Status: DC | PRN
  Administered 2018-06-15: 50 ug via INTRAVENOUS
  Filled 2018-06-14: qty 2

## 2018-06-14 MED ORDER — HYDROMORPHONE HCL 1 MG/ML IJ SOLN
0.5000 mg | Freq: Once | INTRAMUSCULAR | Status: AC
Start: 2018-06-14 — End: 2018-06-14
  Administered 2018-06-14: 0.5 mg via INTRAVENOUS
  Filled 2018-06-14: qty 1

## 2018-06-14 MED ORDER — PROPOFOL 10 MG/ML IV BOLUS
INTRAVENOUS | Status: AC
  Filled 2018-06-14: qty 20

## 2018-06-14 MED ORDER — LEVOTHYROXINE SODIUM 100 MCG IV SOLR
50.0000 ug | Freq: Every day | INTRAVENOUS | Status: DC
  Administered 2018-06-15 – 2018-06-20 (×6): 50 ug via INTRAVENOUS
  Filled 2018-06-14 (×8): qty 5

## 2018-06-14 MED ORDER — FENTANYL CITRATE (PF) 100 MCG/2ML IJ SOLN
50.0000 ug | Freq: Once | INTRAMUSCULAR | Status: AC
Start: 2018-06-14 — End: 2018-06-14
  Administered 2018-06-14: 50 ug via INTRAVENOUS
  Filled 2018-06-14: qty 2

## 2018-06-14 MED ORDER — NEOSTIGMINE METHYLSULFATE 10 MG/10ML IV SOLN
INTRAVENOUS | Status: DC | PRN
  Administered 2018-06-14: 3 mg via INTRAVENOUS

## 2018-06-14 MED ORDER — VECURONIUM BROMIDE 10 MG IV SOLR
INTRAVENOUS | Status: AC
  Filled 2018-06-14: qty 10

## 2018-06-14 MED ORDER — MORPHINE SULFATE (PF) 2 MG/ML IV SOLN
2.0000 mg | INTRAVENOUS | Status: DC | PRN
  Administered 2018-06-15: 2 mg via INTRAVENOUS
  Filled 2018-06-14: qty 1

## 2018-06-14 MED ORDER — PROPOFOL 10 MG/ML IV BOLUS
INTRAVENOUS | Status: DC | PRN
  Administered 2018-06-14: 100 mg via INTRAVENOUS

## 2018-06-14 MED ORDER — ONDANSETRON HCL 4 MG/2ML IJ SOLN: 4.0000 mg | Freq: Once | INTRAMUSCULAR | Status: DC | PRN

## 2018-06-14 MED ORDER — SUCCINYLCHOLINE CHLORIDE 20 MG/ML IJ SOLN
INTRAMUSCULAR | Status: DC | PRN
  Administered 2018-06-14: 100 mg via INTRAVENOUS

## 2018-06-14 MED ORDER — SUCCINYLCHOLINE CHLORIDE 20 MG/ML IJ SOLN
INTRAMUSCULAR | Status: AC
  Filled 2018-06-14: qty 1

## 2018-06-14 MED ORDER — MEPERIDINE HCL 50 MG/ML IJ SOLN: 6.2500 mg | INTRAMUSCULAR | Status: DC | PRN

## 2018-06-14 MED ORDER — ONDANSETRON HCL 4 MG/2ML IJ SOLN
INTRAMUSCULAR | Status: DC | PRN
  Administered 2018-06-14: 4 mg via INTRAVENOUS

## 2018-06-14 MED ORDER — LACTATED RINGERS IV SOLN: INTRAVENOUS | Status: DC

## 2018-06-14 MED ORDER — DEXMEDETOMIDINE HCL 200 MCG/2ML IV SOLN
INTRAVENOUS | Status: AC
  Filled 2018-06-14: qty 2

## 2018-06-14 MED ORDER — DEXAMETHASONE SODIUM PHOSPHATE 4 MG/ML IJ SOLN
INTRAMUSCULAR | Status: DC | PRN
  Administered 2018-06-14: 8 mg via INTRAVENOUS

## 2018-06-14 MED ORDER — PHENYLEPHRINE HCL 10 MG/ML IJ SOLN
INTRAMUSCULAR | Status: AC
  Filled 2018-06-14: qty 1

## 2018-06-14 MED ORDER — SODIUM CHLORIDE 0.9 % IV SOLN
INTRAVENOUS | Status: DC | PRN
  Administered 2018-06-14: 20:00:00 via INTRAVENOUS
  Administered 2018-06-14: 1 g via INTRAVENOUS

## 2018-06-14 MED ORDER — VECURONIUM BROMIDE 10 MG IV SOLR
INTRAVENOUS | Status: DC | PRN
  Administered 2018-06-14: 4 mg via INTRAVENOUS

## 2018-06-14 MED ORDER — IOPAMIDOL (ISOVUE-300) INJECTION 61%
100.0000 mL | Freq: Once | INTRAVENOUS | Status: AC | PRN
  Administered 2018-06-14: 100 mL via INTRAVENOUS

## 2018-06-14 MED ORDER — SODIUM CHLORIDE 0.9 % IJ SOLN
INTRAMUSCULAR | Status: AC
  Filled 2018-06-14: qty 10

## 2018-06-14 MED ORDER — LACTATED RINGERS IV SOLN
INTRAVENOUS | Status: DC | PRN
  Administered 2018-06-14 (×2): via INTRAVENOUS

## 2018-06-14 MED ORDER — CEFOTETAN DISODIUM 2 G IJ SOLR
INTRAMUSCULAR | Status: AC
  Filled 2018-06-14: qty 2

## 2018-06-14 MED ORDER — LIDOCAINE HCL (CARDIAC) PF 100 MG/5ML IV SOSY
PREFILLED_SYRINGE | INTRAVENOUS | Status: DC | PRN
  Administered 2018-06-14: 50 mg via INTRAVENOUS

## 2018-06-14 MED ORDER — SODIUM CHLORIDE 0.9 % IV BOLUS (SEPSIS)
1000.0000 mL | Freq: Once | INTRAVENOUS | Status: AC
  Administered 2018-06-15: 1000 mL via INTRAVENOUS

## 2018-06-14 MED ORDER — FENTANYL CITRATE (PF) 100 MCG/2ML IJ SOLN
INTRAMUSCULAR | Status: DC | PRN
  Administered 2018-06-14: 100 ug via INTRAVENOUS

## 2018-06-14 MED ORDER — BUPIVACAINE LIPOSOME 1.3 % IJ SUSP
INTRAMUSCULAR | Status: AC
  Filled 2018-06-14: qty 20

## 2018-06-14 MED ORDER — PHENYLEPHRINE 40 MCG/ML (10ML) SYRINGE FOR IV PUSH (FOR BLOOD PRESSURE SUPPORT)
PREFILLED_SYRINGE | INTRAVENOUS | Status: AC
  Filled 2018-06-14: qty 10

## 2018-06-14 MED ORDER — HYDROMORPHONE HCL 1 MG/ML IJ SOLN: 0.2500 mg | INTRAMUSCULAR | Status: DC | PRN

## 2018-06-14 MED ORDER — HYDROCODONE-ACETAMINOPHEN 7.5-325 MG PO TABS: 1.0000 | ORAL_TABLET | Freq: Once | ORAL | Status: DC | PRN

## 2018-06-14 MED ORDER — SODIUM CHLORIDE 0.9 % IV BOLUS (SEPSIS)
1000.0000 mL | Freq: Once | INTRAVENOUS | Status: AC
  Administered 2018-06-14: 1000 mL via INTRAVENOUS

## 2018-06-14 MED ORDER — SODIUM CHLORIDE 0.9 % IV SOLN
INTRAVENOUS | Status: DC
  Administered 2018-06-15 – 2018-06-17 (×4): via INTRAVENOUS

## 2018-06-14 MED ORDER — FENTANYL CITRATE (PF) 100 MCG/2ML IJ SOLN
INTRAMUSCULAR | Status: AC
  Filled 2018-06-14: qty 2

## 2018-06-14 MED ORDER — ONDANSETRON HCL 4 MG/2ML IJ SOLN
INTRAMUSCULAR | Status: AC
  Filled 2018-06-14: qty 2

## 2018-06-14 MED ORDER — DEXAMETHASONE SODIUM PHOSPHATE 4 MG/ML IJ SOLN
INTRAMUSCULAR | Status: AC
  Filled 2018-06-14: qty 2

## 2018-06-14 MED ORDER — PIPERACILLIN-TAZOBACTAM 3.375 G IVPB 30 MIN
3.3750 g | Freq: Once | INTRAVENOUS | Status: AC
  Administered 2018-06-14: 3.375 g via INTRAVENOUS
  Filled 2018-06-14: qty 50

## 2018-06-14 MED ORDER — KETOROLAC TROMETHAMINE 30 MG/ML IJ SOLN
INTRAMUSCULAR | Status: AC
  Filled 2018-06-14: qty 1

## 2018-06-14 MED ORDER — KETOROLAC TROMETHAMINE 30 MG/ML IJ SOLN: 30.0000 mg | Freq: Once | INTRAMUSCULAR | Status: DC | PRN

## 2018-06-14 MED ORDER — KETOROLAC TROMETHAMINE 30 MG/ML IJ SOLN
INTRAMUSCULAR | Status: DC | PRN
  Administered 2018-06-14: 15 mg via INTRAVENOUS

## 2018-06-14 MED ORDER — PIPERACILLIN-TAZOBACTAM 3.375 G IVPB
3.3750 g | Freq: Three times a day (TID) | INTRAVENOUS | Status: DC
  Filled 2018-06-14 (×5): qty 50

## 2018-06-14 MED ORDER — DEXMEDETOMIDINE HCL IN NACL 200 MCG/50ML IV SOLN
INTRAVENOUS | Status: DC | PRN
  Administered 2018-06-14 (×2): 20 ug via INTRAVENOUS

## 2018-06-14 MED ORDER — GLYCOPYRROLATE 0.2 MG/ML IJ SOLN
INTRAMUSCULAR | Status: DC | PRN
  Administered 2018-06-14: 0.3 mg via INTRAVENOUS

## 2018-06-14 MED ORDER — LIDOCAINE HCL (PF) 1 % IJ SOLN
INTRAMUSCULAR | Status: AC
  Filled 2018-06-14: qty 5

## 2018-06-14 MED ORDER — PIPERACILLIN-TAZOBACTAM 3.375 G IVPB
3.3750 g | Freq: Three times a day (TID) | INTRAVENOUS | Status: AC
  Administered 2018-06-15 – 2018-06-19 (×15): 3.375 g via INTRAVENOUS
  Filled 2018-06-14 (×20): qty 50

## 2018-06-14 MED ORDER — SODIUM CHLORIDE 0.9 % IV SOLN: 2.0000 g | INTRAVENOUS | Status: DC

## 2018-06-14 SURGICAL SUPPLY — 61 items
BARRIER SKIN 2 3/4 (OSTOMY) ×3 IMPLANT
BARRIER SKIN 2 3/4 INCH (OSTOMY) ×1
BARRIER SKIN OD2.25 2 3/4 FLNG (OSTOMY) IMPLANT
BNDG GAUZE ELAST 4 BULKY (GAUZE/BANDAGES/DRESSINGS) ×2 IMPLANT
BRR SKN FLT 2.75X2.25 2 PC (OSTOMY) ×2
COVER LIGHT HANDLE STERIS (MISCELLANEOUS) ×8 IMPLANT
DRSG OPSITE POSTOP 4X10 (GAUZE/BANDAGES/DRESSINGS) ×2 IMPLANT
DRSG OPSITE POSTOP 4X8 (GAUZE/BANDAGES/DRESSINGS) IMPLANT
ELECT REM PT RETURN 9FT ADLT (ELECTROSURGICAL) ×4
ELECTRODE REM PT RTRN 9FT ADLT (ELECTROSURGICAL) ×2 IMPLANT
GLOVE BIO SURGEON STRL SZ 6.5 (GLOVE) ×6 IMPLANT
GLOVE BIO SURGEONS STRL SZ 6.5 (GLOVE) ×2
GLOVE BIOGEL M 6.5 STRL (GLOVE) ×2 IMPLANT
GLOVE BIOGEL PI IND STRL 6.5 (GLOVE) ×4 IMPLANT
GLOVE BIOGEL PI IND STRL 7.0 (GLOVE) ×12 IMPLANT
GLOVE BIOGEL PI IND STRL 7.5 (GLOVE) IMPLANT
GLOVE BIOGEL PI INDICATOR 6.5 (GLOVE) ×6
GLOVE BIOGEL PI INDICATOR 7.0 (GLOVE) ×12
GLOVE BIOGEL PI INDICATOR 7.5 (GLOVE) ×2
GLOVE SURG SS PI 7.5 STRL IVOR (GLOVE) IMPLANT
GOWN STRL REUS W/TWL LRG LVL3 (GOWN DISPOSABLE) ×24 IMPLANT
HANDLE SUCTION POOLE (INSTRUMENTS) IMPLANT
INST SET MAJOR GENERAL (KITS) ×4 IMPLANT
KIT TURNOVER KIT A (KITS) ×4 IMPLANT
LIGASURE IMPACT 36 18CM CVD LR (INSTRUMENTS) ×4 IMPLANT
MANIFOLD NEPTUNE II (INSTRUMENTS) ×4 IMPLANT
NDL HYPO 18GX1.5 BLUNT FILL (NEEDLE) ×2 IMPLANT
NDL HYPO 21X1.5 SAFETY (NEEDLE) ×2 IMPLANT
NEEDLE HYPO 18GX1.5 BLUNT FILL (NEEDLE) ×4 IMPLANT
NEEDLE HYPO 21X1.5 SAFETY (NEEDLE) ×4 IMPLANT
NS IRRIG 1000ML POUR BTL (IV SOLUTION) ×14 IMPLANT
PACK COLON (CUSTOM PROCEDURE TRAY) ×4 IMPLANT
PAD ABD 5X9 TENDERSORB (GAUZE/BANDAGES/DRESSINGS) ×4 IMPLANT
PAD ARMBOARD 7.5X6 YLW CONV (MISCELLANEOUS) ×4 IMPLANT
PENCIL HANDSWITCHING (ELECTRODE) ×4 IMPLANT
POUCH OSTOMY 2 3/4  H 3804 (WOUND CARE) ×2
POUCH OSTOMY 2 3/4 H 3804 (WOUND CARE) ×2
POUCH OSTOMY 2 PC DRNBL 2.75 (WOUND CARE) IMPLANT
RELOAD LINEAR CUT PROX 55 BLUE (ENDOMECHANICALS) IMPLANT
RELOAD PROXIMATE 75MM BLUE (ENDOMECHANICALS) ×4 IMPLANT
RELOAD STAPLE 55 3.8 BLU REG (ENDOMECHANICALS) IMPLANT
RELOAD STAPLE 75 3.8 BLU REG (ENDOMECHANICALS) IMPLANT
RETRACTOR WND ALEXIS 25 LRG (MISCELLANEOUS) IMPLANT
RTRCTR WOUND ALEXIS 25CM LRG (MISCELLANEOUS) ×4
SPONGE LAP 18X18 X RAY DECT (DISPOSABLE) ×6 IMPLANT
STAPLER PROXIMATE 55 BLUE (STAPLE) IMPLANT
STAPLER PROXIMATE 75MM BLUE (STAPLE) ×2 IMPLANT
STAPLER VISISTAT (STAPLE) ×4 IMPLANT
SUCTION POOLE HANDLE (INSTRUMENTS) ×4
SUT CHROMIC 0 SH (SUTURE) IMPLANT
SUT CHROMIC 2 0 SH (SUTURE) IMPLANT
SUT CHROMIC 3 0 SH 27 (SUTURE) ×2 IMPLANT
SUT PDS AB CT VIOLET #0 27IN (SUTURE) ×8 IMPLANT
SUT PROLENE 2 0 SH 30 (SUTURE) ×2 IMPLANT
SUT SILK 3 0 SH CR/8 (SUTURE) ×4 IMPLANT
SUT VIC AB 3-0 SH 27 (SUTURE) ×4
SUT VIC AB 3-0 SH 27X BRD (SUTURE) ×2 IMPLANT
SYR 20CC LL (SYRINGE) ×4 IMPLANT
TAPE CLOTH SURG 4X10 WHT LF (GAUZE/BANDAGES/DRESSINGS) ×2 IMPLANT
TRAY FOLEY MTR SLVR 16FR STAT (SET/KITS/TRAYS/PACK) ×4 IMPLANT
YANKAUER SUCT BULB TIP 10FT TU (MISCELLANEOUS) ×4 IMPLANT

## 2018-06-14 NOTE — Progress Notes (Signed)
[facility]  Patient did fair during surgery. Required some phenylephrine for BP.  CVL and arterial line placed preoperatively.    Spoke with Dr. Laural BenesJohnson regarding her care.  Sh will put in the remaining orders.   PRN for pain ordered- morphine, might end up and need more for pain Requiring some phenylephrine intraoperatively, may need some pressors post operatively, will let Dr. Laural BenesJohnson determine if she will continue to need any High risk for developing septic shock + cardiac issues  NPO, NG in place, will develop ileus more than likely  Foley in place for monitoring Arterial line and R IJ CVL in place  Wound RN consulted for ostomy, ostomy supplies Zosyn for 5 days post op given contamination   Michelle GreenhouseLindsay Leor Whyte, MD [facility] [address] [place], [place] [id number] [phone] (office)

## 2018-06-14 NOTE — Transfer of Care (Signed)
Immediate Anesthesia Transfer of Care Note  Patient: Michelle Wall  Procedure(s) Performed: PARTIAL COLECTOMY (N/A ) COLOSTOMY (Left Abdomen)  Patient Location: PACU  Anesthesia Type:General  Level of Consciousness: awake, sedated and patient cooperative  Airway & Oxygen Therapy: Patient Spontanous Breathing and Patient connected to nasal cannula oxygen  Post-op Assessment: Report given to RN and Post -op Vital signs reviewed and stable  Post vital signs: Reviewed  Last Vitals:  Vitals Value Taken Time  BP 129/62 06/14/2018  9:45 PM  Temp    Pulse 95 06/14/2018  9:46 PM  Resp 20 06/14/2018  9:46 PM  SpO2 98 % 06/14/2018  9:46 PM  Vitals shown include unvalidated device data.  Last Pain:  Vitals:   06/14/18 1645  TempSrc:   PainSc: 2          Complications: No apparent anesthesia complications

## 2018-06-14 NOTE — ED Provider Notes (Signed)
Pt signed out by Dr. Erin HearingMessner pending results of CT abd/pelvis.  IMPRESSION: Perforated viscus. Evidence for a bowel perforation involving the sigmoid colon. There is free air and extraluminal stool associated with this perforated bowel.  Small amount of ascites in the abdomen.  Volume loss and mucous plugging in the right lower lobe.  Indeterminate 2.5 cm right adrenal nodule. This may be an incidental finding unless the patient has a history of malignancy. A dedicated adrenal CT could be performed after patient's acute medical problems have been dealt with.  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  Pt given IV zosyn and d/w Dr. Henreitta LeberBridges (surgery) for admission.  Pt still has pain, but was just recently given pain meds.    CRITICAL CARE Performed by: Jacalyn LefevreJulie Nainoa Woldt Nay   Total critical care time:  30 minutes  Critical care time was exclusive of separately billable procedures and treating other patients.  Critical care was necessary to treat or prevent imminent or life-threatening deterioration.  Critical care was time spent personally by me on the following activities: development of treatment plan with patient and/or surrogate as well as nursing, discussions with consultants, evaluation of patient's response to treatment, examination of patient, obtaining history from patient or surrogate, ordering and performing treatments and interventions, ordering and review of laboratory studies, ordering and review of radiographic studies, pulse oximetry and re-evaluation of patient's condition.   Jacalyn LefevreHaviland, Garron Eline, MD 06/14/18 1710

## 2018-06-14 NOTE — Anesthesia Procedure Notes (Signed)
Procedure Name: Intubation Date/Time: 06/14/2018 8:17 PM Performed by: Shona NeedlesWynn, Rajiv Parlato M, MD Pre-anesthesia Checklist: Patient identified, Patient being monitored, Timeout performed, Emergency Drugs available and Suction available Patient Re-evaluated:Patient Re-evaluated prior to induction Oxygen Delivery Method: Circle System Utilized Preoxygenation: Pre-oxygenation with 100% oxygen Induction Type: IV induction, Rapid sequence and Cricoid Pressure applied Laryngoscope Size: Miller and 2 Grade View: Grade I Tube type: Oral Tube size: 7.0 mm Number of attempts: 1 Airway Equipment and Method: Stylet Placement Confirmation: ETT inserted through vocal cords under direct vision,  positive ETCO2 and breath sounds checked- equal and bilateral Secured at: 21 cm Tube secured with: Tape

## 2018-06-14 NOTE — ED Notes (Signed)
Critical result. Lactic Acid 3.7. Dr. Henreitta LeberBridges notified.

## 2018-06-14 NOTE — Anesthesia Postprocedure Evaluation (Signed)
Anesthesia Post Note  Patient: Michelle Wall  Procedure(s) Performed: PARTIAL COLECTOMY (N/A ) COLOSTOMY (Left Abdomen)  Patient location during evaluation: PACU Anesthesia Type: General Level of consciousness: sedated and patient cooperative Pain management: pain level controlled Vital Signs Assessment: post-procedure vital signs reviewed and stable Respiratory status: spontaneous breathing, nonlabored ventilation, respiratory function stable and patient connected to nasal cannula oxygen Cardiovascular status: blood pressure returned to baseline and stable Postop Assessment: no apparent nausea or vomiting Anesthetic complications: no     Last Vitals:  Vitals:   06/14/18 1845 06/14/18 1900  BP:    Pulse:    Resp: (!) 22 (!) 21  Temp:    SpO2:      Last Pain:  Vitals:   06/14/18 1645  TempSrc:   PainSc: 2                  Shona NeedlesVander M Lavoris Sparling

## 2018-06-14 NOTE — Op Note (Signed)
[facility] Operative Note  06/14/18  Preoperative Diagnosis:  Bowel perforation, free air   Postoperative Diagnosis: Bowel perforation, free air/ possible stercoral ulcer of sigmoid colon    Procedure(s) Performed:  Exploratory laparotomy, partial sigmoid colectomy and end colostomy    Surgeon: Leatrice JewelsLindsay C. Henreitta LeberBridges, MD   Assistants: No qualified resident was available    Anesthesia: General endotracheal   Anesthesiologist: Shona NeedlesWynn, Vander M, MD    Specimens:  Part of sigmoid colon   Estimated Blood Loss: Minimal   Blood Replacement: None    Complications: None   Wound Class: Dirty/ Infected / Feculent peritonitis   Operative Indications:  [name] is an 84 yo with a history of HTN, Hypothyroidism, Pulmonary HTN on prior ECHOs and tricuspid regurgitation, who has not been followed by cardiology, who presented with acute abdominal pain and findings of free air and bowel perforation with concern for sigmoid perforation and stool in the abdomen. I discussed the risk and benefits of surgery with her and her children, including but not limited to infection, bleeding, ostomy need, risk of injury to the ureter or other organ, risk of finding a cancer given no history of colonoscopy, risk of needing more surgeries, and risk of cardiopulmonary issues, prolonged ICU stay, and remaining intubated post operatively given her history.  After this discussion, the patient and her family opted to proceed. Prior to the OR a central line was placed in the right IJ, and confirmed on CXR to be at the cavoatrial junction and without PTX.   Findings: Fecal peritonitis; significant contamination with stool in the abdomen; hole in sigmoid colon measuring about 2cm, stool coming from colon; liver without obvious masses, remaining colon and small bowel without obvious masses or pathology    Procedure: The patient was taken to the operating room and placed supine. General endotracheal  anesthesia was induced. Intravenous antibiotics were administered per protocol.  A nasogastric tube positioned to decompress the stomach.  An arterial line was placed in the left radial artery by Dr. Sharee PimpleWynn for further monitoring.  The abdomen was prepared and draped in the usual sterile fashion.   A midline incision was made and carried down through the fascia with electrocautery.  The abdomen was entered and brownish fluid was noted immediately.  The bowel was injected and there was stool in the abdomen with fecal peritonitis of the peritoneal cavity and bowel.  A wound protector was placed.    The small bowel was packed in the Right upper quadrant. Irrigation and suction was used to clear contamination. The sigmoid colon was inspected and a 2 cm hole was noted in the right lateral side wall of the sigmoid colon. There was no obvious mass or diverticula in the region.  The white line of Toldt of the descending colon was incised and the lateral attachments of the colon were freed up. The left ureter was identified and protected. The proximal point of transection was determined about 5cm superior to the perforation and was transected with a 75 mm linear cutting stapler.  The distal point of transection was also determined about 5cm distal to the hole in the sigmoid and divided with a linear cutting stapler.   The mesentery was taken with a Ligasure device again ensuring that the ureter was protected.  The abdomen was copiously irrigated with warm saline and all obvious stool was removed.    The small bowel was ran and there were no obvious injuries or pathologies, there was significant contamination on  the bowel itself and the bowel was inflamed from the peritoneal contamination.  The remaining colon was inspected and palpated but no masses were felt. The liver was felt and had no obvious masses.   To the left of the umbilicus a circular skin incision was made and carried down through the subcutaneous tissue  with cautery. A cruciate incision was made in the fascia and two fingers easily passed through the defect. The end of the colon was brought through this defect with a babcock and was not under any tension and was not twisted. The small bowel was placed into the abdomen in proper anatomic alignment.  The team changed gowns and gloves.  New instruments were used to close the fascia with 0 PDS suture in the standard fashion. The skin was left open with kerlix packing and ABD pads.  The ostomy was then matured in the standard fashion with 3-0 Chromic gut sutures.  The ostomy was pink and healthy. The ostomy was digitized down past the fascia without difficulty. A wafer appliance was placed.   Final inspection revealed acceptable hemostasis. All counts were correct at the end of the case. The patient was awakened from anesthesia and extubate without complication.  The patient went to the PACU in stable condition.   Algis Greenhouse, MD [facility] [address] [place], [place] [address] [phone] (office)

## 2018-06-14 NOTE — Anesthesia Preprocedure Evaluation (Signed)
Anesthesia Evaluation  Patient identified by MRN, date of birth, ID band Patient awake    Reviewed: Allergy & Precautions, H&P , NPO status , Patient's Chart, lab work & pertinent test results  Airway Mallampati: II  TM Distance: >3 FB Neck ROM: full    Dental no notable dental hx.    Pulmonary neg pulmonary ROS, COPD, Current Smoker,    Pulmonary exam normal breath sounds clear to auscultation       Cardiovascular Exercise Tolerance: Good hypertension, + Peripheral Vascular Disease  negative cardio ROS   Rhythm:regular Rate:Normal  Cor Pulmonale   Neuro/Psych negative neurological ROS  negative psych ROS   GI/Hepatic negative GI ROS, Neg liver ROS,   Endo/Other  negative endocrine ROSHypothyroidism   Renal/GU negative Renal ROS  negative genitourinary   Musculoskeletal   Abdominal   Peds  Hematology negative hematology ROS (+)   Anesthesia Other Findings   Reproductive/Obstetrics negative OB ROS                             Anesthesia Physical Anesthesia Plan  ASA: IV and emergent  Anesthesia Plan: General   Post-op Pain Management:    Induction:   PONV Risk Score and Plan:   Airway Management Planned:   Additional Equipment:   Intra-op Plan:   Post-operative Plan:   Informed Consent: I have reviewed the patients History and Physical, chart, labs and discussed the procedure including the risks, benefits and alternatives for the proposed anesthesia with the patient or authorized representative who has indicated his/her understanding and acceptance.   Dental Advisory Given  Plan Discussed with: CRNA  Anesthesia Plan Comments:         Anesthesia Quick Evaluation

## 2018-06-14 NOTE — H&P (Signed)
History and Physical    Michelle Wall ZOX:[id number] DOB: [birth year] DOA: 06/14/2018  PCP: Mirna Mires, MD Patient coming from: Home  I have personally briefly reviewed patient's old medical records in [facility]  Chief Complaint: abd pain  HPI: Michelle Wall is a 84 y.o. female with medical history significant of hypertension tricuspid insufficiency presents with abdominal pain.  Today patient presented with some nausea and abdominal discomfort.  She has not had any prior issues before.  Of note patient recently received some pain medication so is intermittently sleepy so not much history from her.  She denies any chest pain shortness breath fever cough or diarrhea.  Patient was found to have free air on imaging done in the ED.  Dr. Henreitta Leber general surgery evaluated patient and she was taken back to surgery stat.  She has a document history of tricuspid insufficiency pulmonary hypertension.  She has not followed up with cardiology in the recent years.  She has hypertension for which she takes meds and is compliant.  Postop patient was extubated  without any difficulty as far as her breathing is concerned per report from surgery.  Her blood pressure did drop some in surgery so art line was placed .  She did receive a few doses of vasopressin.  Lactic acid is elevated around 3.7.  Did receive Zosyn intraoperatively.     Review of Systems: As per HPI otherwise 10 point review of systems negative.  + abdominal pain and nausea all others reviewed and otherwise negative other than mentioned above per HPI  Past Medical History:  Diagnosis Date  . Carotid atherosclerosis   . Cor pulmonale (HCC)   . Emphysema   . Hypertension   . Hypothyroidism   . Pulmonary hypertension (HCC)    moderate  last 2D echo EF greater than 55%  mild to moderate tricuspid insufficiency  . Tricuspid insufficiency    moderate to severe    Past Surgical History:  Procedure Laterality Date  .  THYROIDECTOMY, PARTIAL    . TUBAL LIGATION     midline scar ? possibly tubal ligation     reports that she has been smoking cigarettes.  She has a 60.00 pack-year smoking history. She has never used smokeless tobacco. She reports that she does not drink alcohol or use drugs.  No Known Allergies  Family History  Problem Relation Age of Onset  . Cancer Other   Reviewed and is noncontributory  Prior to Admission medications   Medication Sig Start Date End Date Taking? Authorizing Provider  aspirin EC 81 MG tablet Take 81 mg by mouth daily.   Yes [provider]  CALCIUM-VITAMIN D PO Take 1 tablet by mouth 2 (two) times daily.   Yes [provider]  hydrochlorothiazide (HYDRODIURIL) 12.5 MG tablet Take 12.5 mg by mouth daily.   Yes [provider]  latanoprost (XALATAN) 0.005 % ophthalmic solution Place 1 drop into both eyes at bedtime. 04/17/14  Yes [provider]  lisinopril (PRINIVIL,ZESTRIL) 20 MG tablet Take 20 mg by mouth 2 (two) times daily.   Yes [provider]  SYNTHROID 100 MCG tablet TK 1 T PO Q MORNING. 03/22/18  Yes [provider]  verapamil (CALAN-SR) 240 MG CR tablet Take 240 mg by mouth 2 (two) times daily.   Yes [provider]    Physical Exam: Vitals:   06/14/18 1845 06/14/18 1900 06/14/18 2145 06/14/18 2200  BP:   129/62 117/62  Pulse:   Marland Kitchen)  109 94  Resp: (!) 22 (!) 21 (!) 31 20  Temp:   98.7 F (37.1 C)   TempSrc:      SpO2:   99% 98%  Weight:      Height:        Constitutional: NAD, calm, comfortable Vitals:   06/14/18 1845 06/14/18 1900 06/14/18 2145 06/14/18 2200  BP:   129/62 117/62  Pulse:   (!) 109 94  Resp: (!) 22 (!) 21 (!) 31 20  Temp:   98.7 F (37.1 C)   TempSrc:      SpO2:   99% 98%  Weight:      Height:       Eyes: PERRL, lids and conjunctivae normal ENMT: Mucous membranes are moist. Posterior pharynx clear of any exudate or lesions..  Neck: normal, supple, no masses, no  thyromegaly Respiratory: clear to auscultation bilaterally, no wheezing, no crackles. Cardiovascular: Regular rate and rhythm, +slight  murmurs . No extremity edema. 2+ pedal pulses. .  Abdomen: + diffuse  Tenderness,+ distension and peritoneal sign  Bowel sounds positive.  Musculoskeletal: no clubbing / cyanosis. No joint deformity upper and lower extremities.  Skin: no rashes, lesions, ulcers. No induration Neurologic:  Sleepy but follows command moves all ext equally  Psychiatric: Normal judgment and insight. Alert and oriented x 3.  Labs on Admission: I have personally reviewed following labs and imaging studies  CBC: Recent Labs  Lab 06/14/18 1317  WBC 6.1  HGB 16.0*  HCT 46.3*  MCV 92.2  PLT 231   Basic Metabolic Panel: Recent Labs  Lab 06/14/18 1317  NA 135  K 3.1*  CL 100  CO2 25  GLUCOSE 140*  BUN 14  CREATININE 0.75  CALCIUM 9.2   GFR: Estimated Creatinine Clearance: 46.8 mL/min (by C-G formula based on SCr of 0.75 mg/dL). Liver Function Tests: Recent Labs  Lab 06/14/18 1317  AST 17  ALT 13  ALKPHOS 41  BILITOT 0.9  PROT 7.7  ALBUMIN 3.9   Recent Labs  Lab 06/14/18 1317  LIPASE 28   No results for input(s): AMMONIA in the last 168 hours. Coagulation Profile: No results for input(s): INR, PROTIME in the last 168 hours. Cardiac Enzymes: No results for input(s): CKTOTAL, CKMB, CKMBINDEX, TROPONINI in the last 168 hours. BNP (last 3 results) No results for input(s): PROBNP in the last 8760 hours. HbA1C: No results for input(s): HGBA1C in the last 72 hours. CBG: No results for input(s): GLUCAP in the last 168 hours. Lipid Profile: No results for input(s): CHOL, HDL, LDLCALC, TRIG, CHOLHDL, LDLDIRECT in the last 72 hours. Thyroid Function Tests: No results for input(s): TSH, T4TOTAL, FREET4, T3FREE, THYROIDAB in the last 72 hours. Anemia Panel: No results for input(s): VITAMINB12, FOLATE, FERRITIN, TIBC, IRON, RETICCTPCT in the last 72  hours. Urine analysis: No results found for: COLORURINE, APPEARANCEUR, LABSPEC, PHURINE, GLUCOSEU, HGBUR, BILIRUBINUR, KETONESUR, PROTEINUR, UROBILINOGEN, NITRITE, LEUKOCYTESUR  Radiological Exams on Admission: Ct Abdomen Pelvis W Contrast  Result Date: 06/14/2018 CLINICAL DATA:  84 year old with acute abdominal pain, generalized. EXAM: CT ABDOMEN AND PELVIS WITH CONTRAST TECHNIQUE: Multidetector CT imaging of the abdomen and pelvis was performed using the standard protocol following bolus administration of intravenous contrast. CONTRAST:  ISOVUE-300 IOPAMIDOL (ISOVUE-300) INJECTION 61% COMPARISON:  None. FINDINGS: Lower chest: Filling defects in the right lower lobe airways that could represent aspiration or mucous plugging. There is atelectasis in both lower lobes. There appears to be volume loss in the right middle lobe. Coronary artery  calcifications. Hepatobiliary: Perihepatic ascites. There is free air within the perihepatic ascites. Gallbladder is unremarkable. Poorly defined low-density structure in the right hepatic lobe measures 1.1 cm and nonspecific. Portal venous system is patent. Pancreas: Unremarkable. No pancreatic ductal dilatation or surrounding inflammatory changes. Spleen: Normal in size without focal abnormality. Adrenals/Urinary Tract: 2.5 cm nodule in the right adrenal gland is indeterminate. Large cyst involving the right kidney upper pole measures up to 5 cm. Scattered hypodensities in both kidneys probably represent additional renal cysts. Some of these hypodensities are indeterminate and too small to definitively characterize. No hydronephrosis. Urinary bladder is unremarkable. Stomach/Bowel: Evidence for extraluminal gas in the right upper abdomen adjacent to the liver. In addition, there appears to be extraluminal gas and extraluminal stool in the right lower abdomen and pelvic region. Findings are suggestive for a perforation involving the sigmoid colon. This area of  perforation is probably on sequence 2, image 61. The hepatic flexure is anterior to the liver. There appears to be a normal appendix in the right upper abdomen. Free fluid in the central mesentery on sequence 2 image 53. Mesenteric edema and a small amount of mesenteric fluid. 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 Atherosclerotic disease involving the aorta and visceral arteries without aortic aneurysm. The origin of the left renal artery is heavily calcified. No lymph node enlargement in the abdomen or pelvis. Reproductive: Small uterus with myometrial calcifications. No evidence for an adnexal mass but limited evaluation due to the bowel pathology in this area. Other: Small amount of free fluid in the abdomen. Musculoskeletal: Multilevel degenerative disease in lumbar spine with mild scoliosis. IMPRESSION: Perforated viscus. Evidence for a bowel perforation involving the sigmoid colon. There is free air and extraluminal stool associated with this perforated bowel. Small amount of ascites in the abdomen. Volume loss and mucous plugging in the right lower lobe. Indeterminate 2.5 cm right adrenal nodule. This may be an incidental finding unless the patient has a history of malignancy. A dedicated adrenal CT could be performed after patient's acute medical problems have been dealt with. 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 These results were called by telephone at the time of interpretation on 06/14/2018 at 4:33 pm to Dr. Particia NearingHaviland , who verbally acknowledged these results. Electronically Signed   By: Richarda OverlieAdam  Henn M.D.   On: 06/14/2018 16:44   Dg Chest Port 1 View  Result Date: 06/14/2018 CLINICAL DATA:  Central line placement EXAM: PORTABLE CHEST 1 VIEW COMPARISON:  06/14/2018 FINDINGS: Cardiac shadow is within normal limits. New right jugular central line is noted extending just below the cavoatrial junction. No  pneumothorax is seen. Calcified granuloma in the right apex is noted. No focal infiltrate or sizable effusion is seen. IMPRESSION: No acute abnormality noted following central line placement. Electronically Signed   By: Alcide CleverMark  Lukens M.D.   On: 06/14/2018 20:17   Dg Chest Port 1 View  Result Date: 06/14/2018 CLINICAL DATA:  Preop testing. EXAM: PORTABLE CHEST 1 VIEW COMPARISON:  Radiographs of May 19, 2014. FINDINGS: Stable cardiomediastinal silhouette. Atherosclerosis of thoracic aorta is noted. No pneumothorax or pleural effusion is noted. Hypoinflation of the lungs is noted with mild bibasilar subsegmental atelectasis. Bony thorax is unremarkable. IMPRESSION: Hypoinflation of the lungs with mild bibasilar subsegmental atelectasis. Aortic Atherosclerosis (ICD10-I70.0). Electronically Signed   By: Lupita RaiderJames  Green Jr, M.D.   On: 06/14/2018 18:25    EKG: Independently reviewed. SR  Assessment/Plan Principal  Problem:   Sepsis (HCC) Active Problems:   Perforated sigmoid colon (HCC)   Tobacco abuse   Essential hypertension   Pulmonary HTN (HCC)   Moderate tricuspid insufficiency   Hypothyroid    Adrenal nodule seen on CT  -ICU admit, NS bolus, follow lactic acid. Iv Zosyn, supportive care, pressors as needed.if patient starts to decompensate will benefit from transfer to  [facility] for intensivist support.  -Npo, cont NGT , advance diet per surgery -Hold anti hypertensives. -Start home synthroid dose IV -Pulmonary toilet -Echo to eval pulm Htn and TR when stable  -Adrenal nodule will need follow-up imaging.  Total critical care time 60 minutes including evaluation and coordination of patient's care   DVT prophylaxis: SCDs  Code Status: Full  Disposition Plan: Suspect discharge to rehab 1 week  Consults called: Dr Henreitta Leber  Admission status: Inpatient ICU   Synetta Fail MD [facility] Pager [phone]  If 7PM-7AM, please contact night-coverage [email or web address] Password  [facility]  06/14/2018, 10:13 PM

## 2018-06-14 NOTE — Procedures (Signed)
Procedure Note  06/14/18   Preoperative Diagnosis: Bowel perforation, potential for septic shock    Postoperative Diagnosis: Same   Procedure(s) Performed: Central Line placement,  Right jugular    Surgeon: Leatrice JewelsLindsay C. Henreitta LeberBridges, MD   Assistants: None   Anesthesia: 1% lidocaine    Complications: None    Indications: Michelle Wall is a 84 y.o. with bowel perforation, impending septic shock and need for CVL for monitoring and possible pressor requirements . I discussed the risk and benefits of placement of the central line with her and her children, including but not limited to bleeding, infection, and risk of pneumothorax. She has given verbal consent for the procedure.    Procedure: The patient placed supine. The right chest and neck was prepped and draped in the usual sterile fashion.  Wearing full gown and gloves, I performed the procedure.  One percent lidocaine was used for local anesthesia. An ultrasound was utilized to assess the jugular vein.  The needle with syringe was advanced into the subclavian vein but I accessed the subclavian artery. This was removed and pressure was held for 5 minutes. I attempted to see if I could see the subclavian vein with the ultrasound, but this was very small in comparison to the artery, and I could not get blood back.  I then proceeded to use the US to assess the right jugular vein. I numbed up the right neck and access the right jugular vein under ultrasound access. There was dark venous return, and a wire was placed using the Seldinger technique without difficulty.  Ectopia was noted. I confirmed that the wire was in the vein with the US.  The skin was knicked and a dilator was placed, and the three lumen catheter was placed over the wire with continued control of the wire.  There was good draw back of blood from all three lumens and each flushed easily with saline.  The catheter was secured in 4 points with 2-0 silk and a biopatch and dressing was placed.      The patient tolerated the procedure well, and the CXR was ordered to confirm position of the central line.  She did have some desaturations during the central line placement. Unsure if this is related to placement and possible PTX versus patient's underlying pulmonary disease or current pathology. CXR pending.   Michelle GreenhouseLindsay Velvet Moomaw, MD [facility] [address] [place], [place] [id number] [phone] (office)

## 2018-06-14 NOTE — ED Provider Notes (Signed)
Emergency Department Provider Note   I have reviewed the triage vital signs and the nursing notes.   HISTORY  Chief Complaint Abdominal Pain   HPI Michelle Wall is a 84 y.o. female with multiple medical problems as documented below the presents to the emergency department today with abdominal pain.  Patient states she had a harder than normal bowel movement this morning but then shortly after she started having progressively worsening abdominal pain is seem to be worse in her right upper quadrant.  She also developed abdominal distention.  No gas since that time.  Progressively worsened so she came here for further evaluation.  She states she never had surgery on her abdomen.  She still has her gallbladder and her appendix.  She states that she is never had any pain like this before.  Has not taken the home for pain.  Some nausea but no vomiting. No other associated or modifying symptoms.    Past Medical History:  Diagnosis Date  . Carotid atherosclerosis   . Cor pulmonale (HCC)   . Emphysema   . Hypertension   . Hypothyroidism   . Pulmonary hypertension (HCC)    moderate  last 2D echo EF greater than 55%  mild to moderate tricuspid insufficiency  . Tricuspid insufficiency    moderate to severe    Patient Active Problem List   Diagnosis Date Noted  . Tobacco abuse 05/16/2013  . Essential hypertension 05/16/2013  . COPD (chronic obstructive pulmonary disease) (HCC) 05/16/2013  . Pulmonary hypertension (HCC) 05/16/2013  . Moderate tricuspid insufficiency 05/16/2013    Past Surgical History:  Procedure Laterality Date  . THYROIDECTOMY, PARTIAL      Current Outpatient Rx  . Order #: [id number] Class: Historical Med  . Order #: [id number] Class: Historical Med  . Order #: [id number] Class: Historical Med  . Order #: [id number] Class: Historical Med  . Order #: [id number] Class: Historical Med  . Order #: [id number] Class: Historical Med  . Order #: [id number] Class: Historical Med      Allergies Patient has no known allergies.  Family History  Problem Relation Age of Onset  . Cancer Other     Social History Social History   Tobacco Use  . Smoking status: Current Every Day Smoker    Packs/day: 1.00    Years: 60.00    Pack years: 60.00    Types: Cigarettes  . Smokeless tobacco: Never Used  Substance Use Topics  . Alcohol use: No  . Drug use: No    Review of Systems  All other systems negative except as documented in the HPI. All pertinent positives and negatives as reviewed in the HPI. ____________________________________________   PHYSICAL EXAM:  VITAL SIGNS: ED Triage Vitals  Enc Vitals Group     BP 06/14/18 1234 (!) 132/45     Pulse Rate 06/14/18 1234 84     Resp 06/14/18 1234 17     Temp 06/14/18 1234 98.3 F (36.8 C)     Temp Source 06/14/18 1234 Oral     SpO2 06/14/18 1234 94 %     Weight 06/14/18 1233 135 lb (61.2 kg)     Height 06/14/18 1233 5\' 4"  (1.626 m)    Constitutional: Alert and oriented. Well appearing and in no acute distress. Eyes: Conjunctivae are normal. PERRL. EOMI. Head: Atraumatic. Nose: No congestion/rhinnorhea. Mouth/Throat: Mucous membranes are moist.  Oropharynx non-erythematous. Neck: No stridor.  No meningeal signs.   Cardiovascular: Normal rate, regular rhythm. Good peripheral circulation. Grossly normal heart sounds.  Respiratory: Normal respiratory effort.  No retractions. Lungs CTAB. Gastrointestinal: Soft and ttp in RUQ/RLQ. Distension R>L.  Musculoskeletal: No lower extremity tenderness nor edema. No gross deformities of extremities. Neurologic:  Normal speech and language. No gross focal neurologic deficits are appreciated.  Skin:  Skin is warm, dry and intact. No rash noted.   ____________________________________________   LABS (all labs ordered are listed, but only abnormal results are displayed)  Labs Reviewed  COMPREHENSIVE METABOLIC PANEL - Abnormal; Notable for the following components:       Result Value   Potassium 3.1 (*)    Glucose, Bld 140 (*)    All other components within normal limits  CBC - Abnormal; Notable for the following components:   Hemoglobin 16.0 (*)    HCT 46.3 (*)    All other components within normal limits  LIPASE, BLOOD  URINALYSIS, ROUTINE W REFLEX MICROSCOPIC   ____________________________________________  EKG   EKG Interpretation  Date/Time:    Ventricular Rate:    PR Interval:    QRS Duration:   QT Interval:    QTC Calculation:   R Axis:     Text Interpretation:         ____________________________________________  RADIOLOGY  No results found.  ____________________________________________   PROCEDURES  Procedure(s) performed:   Procedures   ____________________________________________   INITIAL IMPRESSION / ASSESSMENT AND PLAN / ED COURSE  Concern for hollow viscus perforation versus just severe gas or colonic obstruction.  Possible small bowel obstruction.  Has a family history of colorectal cancer could be something that with a gastric outlet obstruction.  Thought to be less Less likely but could be severe constipation with flatulence however with the amount of pain she is in we will get a CT scan and labs to evaluate further.  Reassessed and pain improved with pain meds but still sginficantly tender, now in RUQ> everywhere else but still seems distended. More pain meds ordered.   Care transferred pending CT scan and results to determine disposition. Labs reviewed and relatively unremarkable.   Pertinent labs & imaging results that were available during my care of the patient were reviewed by me and considered in my medical decision making (see chart for details).  ____________________________________________  FINAL CLINICAL IMPRESSION(S) / ED DIAGNOSES  Final diagnoses:  None     MEDICATIONS GIVEN DURING THIS VISIT:  Medications  HYDROmorphone (DILAUDID) injection 0.5 mg (has no administration in  time range)  fentaNYL (SUBLIMAZE) injection 50 mcg (50 mcg Intravenous Given 06/14/18 1412)     NEW OUTPATIENT MEDICATIONS STARTED DURING THIS VISIT:  New Prescriptions   No medications on file    Note:  This note was prepared with assistance of Dragon voice recognition software. Occasional wrong-word or sound-a-like substitutions may have occurred due to the inherent limitations of voice recognition software.   Marily Memos, MD 06/14/18 1510

## 2018-06-14 NOTE — Consult Note (Signed)
[facility] Consult  Reason for Consult: Bowel perforation, free  Referring Physician:  Dr. Gilford Raid   Chief Complaint    Abdominal Pain      Michelle Wall is a 84 y.o. female.  HPI: Michelle Wall is a 84 yo with history of hypothyroidism, HTN, who also has reported history of Pulmonary hypertension, last evaluated in 2011 on ECHO and some tricuspid insufficiency, who came to the ED today with severe abdominal pain and some nausea. She suffers from constipation at baseline and reports that she has never had any issues with bleeding per rectum or dark tarry stools. She also reports that she has never had a colonoscopy.    She has no documentation of being seen by a cardiologist or pulmonologist sine about 2015, and says she only sees Dr. Berdine Addison. She lives at home with her son. She reports that she does her chores daily and her daughters confirm that she does more house work than they do.  She denies any chest pain or SOB.     Past Medical History:  Diagnosis Date  . Carotid atherosclerosis   . Cor pulmonale ([place])   . Emphysema   . Hypertension   . Hypothyroidism   . Pulmonary hypertension (HCC)    moderate  last 2D echo EF greater than 55%  mild to moderate tricuspid insufficiency  . Tricuspid insufficiency    moderate to severe    Past Surgical History:  Procedure Laterality Date  . THYROIDECTOMY, PARTIAL      Family History  Problem Relation Age of Onset  . Cancer Other     Social History   Tobacco Use  . Smoking status: Current Every Day Smoker    Packs/day: 1.00    Years: 60.00    Pack years: 60.00    Types: Cigarettes  . Smokeless tobacco: Never Used  Substance Use Topics  . Alcohol use: No  . Drug use: No    Medications: I have reviewed the patient's current medications. No current facility-administered medications for this encounter.    Current Outpatient Medications  Medication Sig Dispense Refill Last Dose  . aspirin EC 81 MG  tablet Take 81 mg by mouth daily.   06/14/2018 at Unknown time  . CALCIUM-VITAMIN D PO Take 1 tablet by mouth 2 (two) times daily.   06/14/2018 at Unknown time  . hydrochlorothiazide (HYDRODIURIL) 12.5 MG tablet Take 12.5 mg by mouth daily.   06/14/2018 at Unknown time  . latanoprost (XALATAN) 0.005 % ophthalmic solution Place 1 drop into both eyes at bedtime.   06/13/2018 at Unknown time  . lisinopril (PRINIVIL,ZESTRIL) 20 MG tablet Take 20 mg by mouth 2 (two) times daily.   06/14/2018 at Unknown time  . SYNTHROID 100 MCG tablet TK 1 T PO Q MORNING.  3 06/14/2018 at Unknown time  . verapamil (CALAN-SR) 240 MG CR tablet Take 240 mg by mouth 2 (two) times daily.   06/14/2018 at Unknown time   No Known Allergies   ROS:  A comprehensive review of systems was negative except for: Gastrointestinal: positive for abdominal pain, constipation and nausea  No rectal bleeding or dark stools No chest pain or SOB  Blood pressure (!) 158/81, pulse 99, temperature 98.3 F (36.8 C), temperature source Oral, resp. rate 20, height 5' 4" (1.626 m), weight 135 lb (61.2 kg), SpO2 93 %. Physical Exam  Constitutional: She is oriented to person, place, and time. She appears well-developed. No distress.  HENT:  Head: Normocephalic.  Eyes: Pupils are equal, round, and reactive to light.  Cardiovascular: Normal rate and regular rhythm.  Pulmonary/Chest: Effort normal and breath sounds normal.  Abdominal: Soft. There is generalized tenderness. There is guarding.  Genitourinary: Rectum normal. Rectal exam shows no mass and no tenderness.  Neurological: She is alert and oriented to person, place, and time.  Psychiatric: She has a normal mood and affect. Her behavior is normal.  Vitals reviewed.   Results: Results for orders placed or performed during the hospital encounter of 06/14/18 (from the past 48 hour(s))  Lipase, blood     Status: None   Collection Time: 06/14/18  1:17 PM  Result Value Ref Range   Lipase 28  11 - 51 U/L    Comment: Performed at [facility], [address] [place], [place] [address]  Comprehensive metabolic panel     Status: Abnormal   Collection Time: 06/14/18  1:17 PM  Result Value Ref Range   Sodium 135 135 - 145 mmol/L   Potassium 3.1 (L) 3.5 - 5.1 mmol/L   Chloride 100 98 - 111 mmol/L   CO2 25 22 - 32 mmol/L   Glucose, Bld 140 (H) 70 - 99 mg/dL   BUN 14 8 - 23 mg/dL   Creatinine, Ser 0.75 0.44 - 1.00 mg/dL   Calcium 9.2 8.9 - 10.3 mg/dL   Total Protein 7.7 6.5 - 8.1 g/dL   Albumin 3.9 3.5 - 5.0 g/dL   AST 17 15 - 41 U/L   ALT 13 0 - 44 U/L   Alkaline Phosphatase 41 38 - 126 U/L   Total Bilirubin 0.9 0.3 - 1.2 mg/dL   GFR calc non Af Amer >60 >60 mL/min   GFR calc Af Amer >60 >60 mL/min    Comment: (NOTE) The eGFR has been calculated using the CKD EPI equation. This calculation has not been validated in all clinical situations. eGFR's persistently <60 mL/min signify possible Chronic Kidney Disease.    Anion gap 10 5 - 15    Comment: Performed at [facility], [address] [place], [place] [address]  CBC     Status: Abnormal   Collection Time: 06/14/18  1:17 PM  Result Value Ref Range   WBC 6.1 4.0 - 10.5 K/uL   RBC 5.02 3.87 - 5.11 MIL/uL   Hemoglobin 16.0 (H) 12.0 - 15.0 g/dL   HCT 46.3 (H) 36.0 - 46.0 %   MCV 92.2 78.0 - 100.0 fL   MCH 31.9 26.0 - 34.0 pg   MCHC 34.6 30.0 - 36.0 g/dL   RDW 14.2 11.5 - 15.5 %   Platelets 231 150 - 400 K/uL    Comment: Performed at [facility], [address] [place], [place] [address]   Personally reviewed- free air and fluid with concern for possible sigmoid perforation, some diverticula, possible area on the liver, no definitive masses   Ct Abdomen Pelvis W Contrast  Result Date: 06/14/2018 CLINICAL DATA:  84 year old with acute abdominal pain, generalized. EXAM: CT ABDOMEN AND PELVIS WITH CONTRAST TECHNIQUE: Multidetector CT imaging of the abdomen and pelvis was performed using the standard protocol following  bolus administration of intravenous contrast. CONTRAST:  123m ISOVUE-300 IOPAMIDOL (ISOVUE-300) INJECTION 61% COMPARISON:  None. FINDINGS: Lower chest: Filling defects in the right lower lobe airways that could represent aspiration or mucous plugging. There is atelectasis in both lower lobes. There appears to be volume loss in the right middle lobe. Coronary artery calcifications. Hepatobiliary: Perihepatic ascites. There is free air within  the perihepatic ascites. Gallbladder is unremarkable. Poorly defined low-density structure in the right hepatic lobe measures 1.1 cm and nonspecific. Portal venous system is patent. Pancreas: Unremarkable. No pancreatic ductal dilatation or surrounding inflammatory changes. Spleen: Normal in size without focal abnormality. Adrenals/Urinary Tract: 2.5 cm nodule in the right adrenal gland is indeterminate. Large cyst involving the right kidney upper pole measures up to 5 cm. Scattered hypodensities in both kidneys probably represent additional renal cysts. Some of these hypodensities are indeterminate and too small to definitively characterize. No hydronephrosis. Urinary bladder is unremarkable. Stomach/Bowel: Evidence for extraluminal gas in the right upper abdomen adjacent to the liver. In addition, there appears to be extraluminal gas and extraluminal stool in the right lower abdomen and pelvic region. Findings are suggestive for a perforation involving the sigmoid colon. This area of perforation is probably on sequence 2, image 61. The hepatic flexure is anterior to the liver. There appears to be a normal appendix in the right upper abdomen. Free fluid in the central mesentery on sequence 2 image 53. Mesenteric edema and a small amount of mesenteric fluid. 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 Atherosclerotic disease involving the aorta and visceral arteries without aortic aneurysm.  The origin of the left renal artery is heavily calcified. No lymph node enlargement in the abdomen or pelvis. Reproductive: Small uterus with myometrial calcifications. No evidence for an adnexal mass but limited evaluation due to the bowel pathology in this area. Other: Small amount of free fluid in the abdomen. Musculoskeletal: Multilevel degenerative disease in lumbar spine with mild scoliosis. IMPRESSION: Perforated viscus. Evidence for a bowel perforation involving the sigmoid colon. There is free air and extraluminal stool associated with this perforated bowel. Small amount of ascites in the abdomen. Volume loss and mucous plugging in the right lower lobe. Indeterminate 2.5 cm right adrenal nodule. This may be an incidental finding unless the patient has a history of malignancy. A dedicated adrenal CT could be performed after patient's acute medical problems have been dealt with. 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 These results were called by telephone at the time of interpretation on 06/14/2018 at 4:33 pm to Dr. Gilford Raid , who verbally acknowledged these results. Electronically Signed   By: Markus Daft M.D.   On: 06/14/2018 16:44    Assessment & Plan:  Onisha Lemmie Evens Kinchen is a 84 y.o. female with free fluid and free air likely from sigmoid perforation of unknown etiology. The patient does have some documented issues with pulmonary HTN and tricuspid insufficiency. She is symptomatically not reporting any chest pain, cardiac issues, SOB, no leg swelling, no dizzines or fainting spells or need for any inhalers. Yet, we are unsure of how much the pulmonary HTN has progressed in the last 9 years since she was evaluated.  She is only on medication for BP.  - Hospitalist, Dr. Wynetta Emery Consulted to admit patient given co-morbidities, have discussed patient with her and agrees likelihood of needing ICU care and possible need for transfer, she is evaluating her now - Dr. Rick Duff Anesthesia, has been made aware of  the patient, overall for her functional status, she appears and is clinically better than would expect for her prior documented degree of Pulmonary HTN, but again unsure of how patient will do post operatively with the potential for cardiac issues, pulmonary issues, and septic shock issues  - I do think the patient will ultimately undergo surgery at a  more expedited fashion here and if needed we can transfer her post operatively   -Have discussed with the patient and her family, 2 daughters and a son, the potential for finding cancer, the risk of bleeding, infection, need for possible ostomy, risk of remaining intubated after surgery, needing ICU care, risk of cardiac problems and risk of lung problems following surgery, discussed that a tracheostomy could also be necessary if she stayed intubated for over 7 days, discussed the potential for need to transfer to [facility]   -Have consented the patient for blood, discussed the risk and benefits, and have discussed the placement of a central venous line and arterial line pending need for further monitoring   All questions were answered to the satisfaction of the patient and family.  Virl Cagey 06/14/2018, 6:00 PM

## 2018-06-14 NOTE — Anesthesia Procedure Notes (Signed)
Arterial Line Insertion Start/End7/23/2019 8:20 PM, 06/14/2018 8:24 PM Performed by: Shona NeedlesWynn, Charle Mclaurin M, MD, anesthesiologist  Patient location: OR. Preanesthetic checklist: patient identified, risks and benefits discussed, surgical consent and timeout performed Left, radial was placed Catheter size: 20 G  Attempts: 1 Procedure performed without using ultrasound guided technique. Following insertion, dressing applied. Additional procedure comments: Placed post induction.

## 2018-06-14 NOTE — ED Notes (Signed)
Patient transported to CT 

## 2018-06-14 NOTE — ED Triage Notes (Signed)
All over abd pain that started today. Had BM this morning that was hard.  Denies n/v

## 2018-06-14 NOTE — ED Notes (Signed)
Pt states she does not need to urinate at this time, aware of DO  

## 2018-06-15 ENCOUNTER — Other Ambulatory Visit: Payer: Self-pay

## 2018-06-15 DIAGNOSIS — I10 Essential (primary) hypertension: Secondary | ICD-10-CM

## 2018-06-15 DIAGNOSIS — E039 Hypothyroidism, unspecified: Secondary | ICD-10-CM

## 2018-06-15 DIAGNOSIS — H409 Unspecified glaucoma: Secondary | ICD-10-CM

## 2018-06-15 DIAGNOSIS — E876 Hypokalemia: Secondary | ICD-10-CM

## 2018-06-15 DIAGNOSIS — K631 Perforation of intestine (nontraumatic): Secondary | ICD-10-CM

## 2018-06-15 DIAGNOSIS — Z720 Tobacco use: Secondary | ICD-10-CM

## 2018-06-15 LAB — URINALYSIS, ROUTINE W REFLEX MICROSCOPIC
BILIRUBIN URINE: NEGATIVE
Bacteria, UA: NONE SEEN
Glucose, UA: NEGATIVE mg/dL
Ketones, ur: NEGATIVE mg/dL
Leukocytes, UA: NEGATIVE
Nitrite: NEGATIVE
PH: 5 (ref 5.0–8.0)
Protein, ur: NEGATIVE mg/dL
Specific Gravity, Urine: >1.046 — ABNORMAL HIGH (ref 1.005–1.030)

## 2018-06-15 LAB — PHOSPHORUS: Phosphorus: 2.6 mg/dL (ref 2.5–4.6)

## 2018-06-15 LAB — CBC WITH DIFFERENTIAL/PLATELET
Basophils Absolute: 0 10*3/uL (ref 0.0–0.1)
Basophils Relative: 0 %
Eosinophils Absolute: 0 10*3/uL (ref 0.0–0.7)
Eosinophils Relative: 1 %
HCT: 43.4 % (ref 36.0–46.0)
Hemoglobin: 14.6 g/dL (ref 12.0–15.0)
Lymphocytes Relative: 9 %
Lymphs Abs: 0.8 10*3/uL (ref 0.7–4.0)
MCH: 31.1 pg (ref 26.0–34.0)
MCHC: 33.6 g/dL (ref 30.0–36.0)
MCV: 92.3 fL (ref 78.0–100.0)
Monocytes Absolute: 0.2 10*3/uL (ref 0.1–1.0)
Monocytes Relative: 2 %
Neutro Abs: 7.6 10*3/uL (ref 1.7–7.7)
Neutrophils Relative %: 88 %
Platelets: 198 10*3/uL (ref 150–400)
RBC: 4.7 MIL/uL (ref 3.87–5.11)
RDW: 14.1 % (ref 11.5–15.5)
WBC: 8.6 10*3/uL (ref 4.0–10.5)

## 2018-06-15 LAB — COMPREHENSIVE METABOLIC PANEL
ALT: 11 U/L (ref 0–44)
AST: 18 U/L (ref 15–41)
Albumin: 2.4 g/dL — ABNORMAL LOW (ref 3.5–5.0)
Alkaline Phosphatase: 25 U/L — ABNORMAL LOW (ref 38–126)
Anion gap: 7 (ref 5–15)
BUN: 15 mg/dL (ref 8–23)
CO2: 23 mmol/L (ref 22–32)
Calcium: 7.5 mg/dL — ABNORMAL LOW (ref 8.9–10.3)
Chloride: 108 mmol/L (ref 98–111)
Creatinine, Ser: 0.85 mg/dL (ref 0.44–1.00)
GFR calc Af Amer: >60 mL/min (ref >60)
GFR calc non Af Amer: >60 mL/min (ref >60)
Glucose, Bld: 130 mg/dL — ABNORMAL HIGH (ref 70–99)
Potassium: 3 mmol/L — ABNORMAL LOW (ref 3.5–5.1)
Sodium: 138 mmol/L (ref 135–145)
Total Bilirubin: 0.7 mg/dL (ref 0.3–1.2)
Total Protein: 5.3 g/dL — ABNORMAL LOW (ref 6.5–8.1)

## 2018-06-15 LAB — MAGNESIUM: MAGNESIUM: 1.5 mg/dL — AB (ref 1.7–2.4)

## 2018-06-15 LAB — LACTIC ACID, PLASMA: Lactic Acid, Venous: 3.4 mmol/L (ref 0.5–1.9)

## 2018-06-15 LAB — MRSA PCR SCREENING: MRSA by PCR: NEGATIVE

## 2018-06-15 MED ORDER — LATANOPROST 0.005 % OP SOLN
1.0000 [drp] | Freq: Every day | OPHTHALMIC | Status: DC
  Administered 2018-06-15 – 2018-06-21 (×7): 1 [drp] via OPHTHALMIC
  Filled 2018-06-15 (×2): qty 2.5

## 2018-06-15 MED ORDER — PNEUMOCOCCAL VAC POLYVALENT 25 MCG/0.5ML IJ INJ
0.5000 mL | INJECTION | INTRAMUSCULAR | Status: AC
  Administered 2018-06-16: 0.5 mL via INTRAMUSCULAR
  Filled 2018-06-15: qty 0.5

## 2018-06-15 MED ORDER — POTASSIUM CHLORIDE 10 MEQ/100ML IV SOLN
10.0000 meq | INTRAVENOUS | Status: AC
  Administered 2018-06-15 (×4): 10 meq via INTRAVENOUS
  Filled 2018-06-15 (×4): qty 100

## 2018-06-15 MED ORDER — HEPARIN SODIUM (PORCINE) 5000 UNIT/ML IJ SOLN
5000.0000 [IU] | Freq: Three times a day (TID) | INTRAMUSCULAR | Status: DC
  Administered 2018-06-15 – 2018-06-22 (×20): 5000 [IU] via SUBCUTANEOUS
  Filled 2018-06-15 (×19): qty 1

## 2018-06-15 MED ORDER — MAGNESIUM SULFATE 2 GM/50ML IV SOLN
2.0000 g | Freq: Once | INTRAVENOUS | Status: AC
  Administered 2018-06-15: 2 g via INTRAVENOUS
  Filled 2018-06-15: qty 50

## 2018-06-15 NOTE — Consult Note (Signed)
WOC Nurse ostomy follow up Performed teaching at [facility].  Multiple family members (one daughter, one son, and one daughter in law present) Stoma type/location: LUQ colostomy Stomal assessment/size: 1 1/4 inch Peristomal assessment: intact, sutures intact, no irritation Treatment options for stomal/peristomal skin: barrier ring Output:  Drops only of serosanginous, no flatus Ostomy pouching: 2pc.  Education provided:  Explained role of ostomy nurse and creation of stoma  Explained stoma characteristics (budded, flush, color, texture, care) Demonstrated pouch change (cutting new skin barrier, measuring stoma, cleaning peristomal skin and stoma, use of barrier ring) Education on emptying when 1/3 to 1/2 full and how to empty Demonstrated "burping" flatus from pouch Demonstrated use of wick to clean spout  Discussed bathing, diet, gas, medication use, constipation Answered patient/family questions. Plan is to return Friday morning for further teaching.  Patient's daughter, Eber JonesCarolyn, stated she will be here. Enrolled patient in [employer] Discharge program: No.  Plan to do so Friday 7/26. Helmut MusterSherry Wilma Michaelson, RN, MSN, CWOCN, CNS-BC, pager [phone]

## 2018-06-15 NOTE — Progress Notes (Addendum)
[facility] Progress Note  1 Day Post-Op  Subjective: Doing well. No complaints. Pain better. Resting. NG without much output.   Objective: Vital signs in last 24 hours: Temp:  [97.6 F (36.4 C)-98.7 F (37.1 C)] 98.4 F (36.9 C) (07/24 1111) Pulse Rate:  [79-109] 98 (07/24 1111) Resp:  [11-36] 18 (07/24 1111) BP: (86-182)/(46-117) 127/55 (07/24 1030) SpO2:  [91 %-100 %] 99 % (07/24 1111) Arterial Line BP: (74-132)/(40-61) 93/53 (07/24 0930) Weight:  [152 lb 8.9 oz (69.2 kg)] 152 lb 8.9 oz (69.2 kg) (07/23 2330)    Intake/Output from previous day: 07/23 0701 - 07/24 0700 In: 1666.9 [I.V.:1581.7; IV Piggyback:85.2] Out: 640 [Urine:610; Blood:30] Intake/Output this shift: No intake/output data recorded.  General appearance: alert, cooperative and no distress Resp: normal work breathing GI: soft, midline with dressing, ostomy swollen and a little pale, no gas in bag  Lab Results:  Recent Labs    06/14/18 1317 06/15/18 0348  WBC 6.1 8.6  HGB 16.0* 14.6  HCT 46.3* 43.4  PLT 231 198   BMET Recent Labs    06/14/18 1317 06/15/18 0348  NA 135 138  K 3.1* 3.0*  CL 100 108  CO2 25 23  GLUCOSE 140* 130*  BUN 14 15  CREATININE 0.75 0.85  CALCIUM 9.2 7.5*   PT/INR Recent Labs    06/14/18 2316  LABPROT 16.3*  INR 1.32    Studies/Results: Ct Abdomen Pelvis W Contrast  Result Date: 06/14/2018 CLINICAL DATA:  84 year old with acute abdominal pain, generalized. EXAM: CT ABDOMEN AND PELVIS WITH CONTRAST TECHNIQUE: Multidetector CT imaging of the abdomen and pelvis was performed using the standard protocol following bolus administration of intravenous contrast. CONTRAST:  100mL ISOVUE-300 IOPAMIDOL (ISOVUE-300) INJECTION 61% COMPARISON:  None. FINDINGS: Lower chest: Filling defects in the right lower lobe airways that could represent aspiration or mucous plugging. There is atelectasis in both lower lobes. There appears to be volume loss in the right  middle lobe. Coronary artery calcifications. Hepatobiliary: Perihepatic ascites. There is free air within the perihepatic ascites. Gallbladder is unremarkable. Poorly defined low-density structure in the right hepatic lobe measures 1.1 cm and nonspecific. Portal venous system is patent. Pancreas: Unremarkable. No pancreatic ductal dilatation or surrounding inflammatory changes. Spleen: Normal in size without focal abnormality. Adrenals/Urinary Tract: 2.5 cm nodule in the right adrenal gland is indeterminate. Large cyst involving the right kidney upper pole measures up to 5 cm. Scattered hypodensities in both kidneys probably represent additional renal cysts. Some of these hypodensities are indeterminate and too small to definitively characterize. No hydronephrosis. Urinary bladder is unremarkable. Stomach/Bowel: Evidence for extraluminal gas in the right upper abdomen adjacent to the liver. In addition, there appears to be extraluminal gas and extraluminal stool in the right lower abdomen and pelvic region. Findings are suggestive for a perforation involving the sigmoid colon. This area of perforation is probably on sequence 2, image 61. The hepatic flexure is anterior to the liver. There appears to be a normal appendix in the right upper abdomen. Free fluid in the central mesentery on sequence 2 image 53. Mesenteric edema and a small amount of mesenteric fluid. 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 Atherosclerotic disease involving the aorta and visceral arteries without aortic aneurysm. The origin of the left renal artery is heavily calcified. No lymph node enlargement in the abdomen or pelvis. Reproductive: Small uterus with myometrial calcifications. No evidence for an adnexal mass  but limited evaluation due to the bowel pathology in this area. Other: Small amount of free fluid in the abdomen. Musculoskeletal: Multilevel  degenerative disease in lumbar spine with mild scoliosis. IMPRESSION: Perforated viscus. Evidence for a bowel perforation involving the sigmoid colon. There is free air and extraluminal stool associated with this perforated bowel. Small amount of ascites in the abdomen. Volume loss and mucous plugging in the right lower lobe. Indeterminate 2.5 cm right adrenal nodule. This may be an incidental finding unless the patient has a history of malignancy. A dedicated adrenal CT could be performed after patient's acute medical problems have been dealt with. 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 These results were called by telephone at the time of interpretation on 06/14/2018 at 4:33 pm to Dr. Particia Nearing , who verbally acknowledged these results. Electronically Signed   By: Richarda Overlie M.D.   On: 06/14/2018 16:44   Dg Chest Port 1 View  Result Date: 06/14/2018 CLINICAL DATA:  Central line placement EXAM: PORTABLE CHEST 1 VIEW COMPARISON:  06/14/2018 FINDINGS: Cardiac shadow is within normal limits. New right jugular central line is noted extending just below the cavoatrial junction. No pneumothorax is seen. Calcified granuloma in the right apex is noted. No focal infiltrate or sizable effusion is seen. IMPRESSION: No acute abnormality noted following central line placement. Electronically Signed   By: Alcide Clever M.D.   On: 06/14/2018 20:17   Dg Chest Port 1 View  Result Date: 06/14/2018 CLINICAL DATA:  Preop testing. EXAM: PORTABLE CHEST 1 VIEW COMPARISON:  Radiographs of May 19, 2014. FINDINGS: Stable cardiomediastinal silhouette. Atherosclerosis of thoracic aorta is noted. No pneumothorax or pleural effusion is noted. Hypoinflation of the lungs is noted with mild bibasilar subsegmental atelectasis. Bony thorax is unremarkable. IMPRESSION: Hypoinflation of the lungs with mild bibasilar subsegmental atelectasis. Aortic Atherosclerosis (ICD10-I70.0). Electronically Signed   By: Lupita Raider, M.D.   On:  06/14/2018 18:25    Anti-infectives: Anti-infectives (From admission, onward)   Start     Dose/Rate Route Frequency Ordered Stop   06/15/18 0100  piperacillin-tazobactam (ZOSYN) IVPB 3.375 g     3.375 g 12.5 mL/hr over 240 Minutes Intravenous Every 8 hours 06/14/18 2211 06/20/18 0059   06/15/18 0100  piperacillin-tazobactam (ZOSYN) IVPB 3.375 g  Status:  Discontinued     3.375 g 12.5 mL/hr over 240 Minutes Intravenous Every 8 hours 06/14/18 2204 06/14/18 2217   06/14/18 2145  cefoTEtan (CEFOTAN) 2 g in sodium chloride 0.9 % 100 mL IVPB  Status:  Discontinued     2 g 200 mL/hr over 30 Minutes Intravenous On call to O.R. 06/14/18 2139 06/14/18 2211   06/14/18 1645  piperacillin-tazobactam (ZOSYN) IVPB 3.375 g     3.375 g 100 mL/hr over 30 Minutes Intravenous  Once 06/14/18 1639 06/14/18 1723      Assessment/Plan: Michelle Wall is a 84 yo s/p partial sigmoid colectomy with end colostomy for perforated sigmoid colon possibly related to Stercoral ulcer. She is doing well.  -PRN for pain -IS, OOB as tolerated  -HD ok, does have significant history of Pulm HTN and Cor pulmonale, monitoring  -Ileus not unexpected, NPO, NG in place until ostomy functioning  -Ostomy RN consulted -Lytes replaced to K 4, Mg 2, Phos 3  -Foley in place, urine ok, would keep for today  -Perioperative antibiotics for 5 days with zosyn, BID packing of midline wound given stool contamination -Can start heparin sq   Discussed with Dr. Gwenlyn Perking.    LOS: 1  day    Lucretia Roers 06/15/2018

## 2018-06-15 NOTE — Addendum Note (Signed)
Addendum  created 06/15/18 1613 by Franco NonesYates, Jamie Belger S, CRNA   Charge Capture section accepted, Visit diagnoses modified

## 2018-06-15 NOTE — Progress Notes (Signed)
PROGRESS NOTE    Michelle Wall  ZOX:[id number] DOB: 1933/08/03 DOA: 06/14/2018 PCP: Mirna MiresHill, Gerald, MD    Brief Narrative:  84 y/o with PMH of HTN, hypothyroidism, tobacco abuse, glaucoma and tricuspid insufficiency; who presented to ED with abd pain. Found to have sepsis in the setting of perforated viscus. General surgery on board and patient is status post laparotomy and colostomy.  Assessment & Plan: 1-Sepsis [facility](HCC): in the setting of acute perforated viscus. -status post laparotomy with colostomy -continue IV antibiotics -remains NPO -continue NGT -follow general surgery rec's -replete electrolytes and especially try to maintain Mg > 2 and K > 4  2-Tobacco abuse -cessation counseling provided -patient declined nicotine patch.  3-Essential hypertension -BP stable w/o needs for antihypertensive drugs currently -will monitor VS  4-hypothyroidism -continue IV synthroid for now.  5-hypokalemia and hypomagnesemia  -will replete as needed -follow electrolytes trend   6-glaucoma -resume xalatan QHS.   DVT prophylaxis: heparin and SCD's Code Status: Full Family Communication: daughters at bedside  Disposition Plan: remains in stepdown, replete electrolytes, start heparin, continue NGT, continue IV antibiotics, follow general surgery rec's.   Consultants:   General surgery   Procedures:   S/p laparotomy with colostomy 7/23  Antimicrobials:  Anti-infectives (From admission, onward)   Start     Dose/Rate Route Frequency Ordered Stop   06/15/18 0100  piperacillin-tazobactam (ZOSYN) IVPB 3.375 g     3.375 g 12.5 mL/hr over 240 Minutes Intravenous Every 8 hours 06/14/18 2211 06/20/18 0059   06/15/18 0100  piperacillin-tazobactam (ZOSYN) IVPB 3.375 g  Status:  Discontinued     3.375 g 12.5 mL/hr over 240 Minutes Intravenous Every 8 hours 06/14/18 2204 06/14/18 2217   06/14/18 2145  cefoTEtan (CEFOTAN) 2 g in sodium chloride 0.9 % 100 mL IVPB  Status:  Discontinued       2 g 200 mL/hr over 30 Minutes Intravenous On call to O.R. 06/14/18 2139 06/14/18 2211   06/14/18 1645  piperacillin-tazobactam (ZOSYN) IVPB 3.375 g     3.375 g 100 mL/hr over 30 Minutes Intravenous  Once 06/14/18 1639 06/14/18 1723      Subjective: Afebrile, no nausea, no vomiting. Reported mild to moderate abdominal pain.  Objective: Vitals:   06/15/18 1900 06/15/18 2000 06/15/18 2100 06/15/18 2200  BP: (!) 111/56 (!) 122/52 (!) 119/54 (!) 121/53  Pulse: (!) 106 (!) 106 98 (!) 102  Resp: (!) 22 17 17  (!) 22  Temp: 99.4 F (37.4 C)     TempSrc: Oral     SpO2: 97% 97% 99% 98%  Weight:      Height:        Intake/Output Summary (Last 24 hours) at 06/15/2018 2230 Last data filed at 06/15/2018 1816 Gross per 24 hour  Intake 2032.71 ml  Output 1010 ml  Net 1022.71 ml   Filed Weights   06/14/18 1233 06/14/18 2330  Weight: 61.2 kg (135 lb) 69.2 kg (152 lb 8.9 oz)    Examination: General exam: Alert, awake, oriented x 3; NGT in place; denies nausea or vomiting.  Respiratory system: Clear to auscultation. Respiratory effort normal. Cardiovascular system:RRR. No rubs, No gallops. Positive soft murmur. No JVD. Gastrointestinal system: Abdomen is mildly distended, tender to palpation, without BS.  Central nervous system: Alert and oriented. No focal neurological deficits. Extremities: No C/C/E, +pedal pulses Skin: No rashes, or petechiae. Colostomy in place; no stools inside colostomy bag; patient with mid-line incision open. Psychiatry: Judgement and insight appear normal. Mood & affect appropriate.  Data Reviewed: I have personally reviewed following labs and imaging studies  CBC: Recent Labs  Lab 06/14/18 1317 06/15/18 0348  WBC 6.1 8.6  NEUTROABS  --  7.6  HGB 16.0* 14.6  HCT 46.3* 43.4  MCV 92.2 92.3  PLT 231 198   Basic Metabolic Panel: Recent Labs  Lab 06/14/18 1317 06/15/18 0348  NA 135 138  K 3.1* 3.0*  CL 100 108  CO2 25 23  GLUCOSE 140* 130*  BUN  14 15  CREATININE 0.75 0.85  CALCIUM 9.2 7.5*  MG  --  1.5*  PHOS  --  2.6   GFR: Estimated Creatinine Clearance: 46.5 mL/min (by C-G formula based on SCr of 0.85 mg/dL).  Liver Function Tests: Recent Labs  Lab 06/14/18 1317 06/15/18 0348  AST 17 18  ALT 13 11  ALKPHOS 41 25*  BILITOT 0.9 0.7  PROT 7.7 5.3*  ALBUMIN 3.9 2.4*   Recent Labs  Lab 06/14/18 1317  LIPASE 28   Coagulation Profile: Recent Labs  Lab 06/14/18 2316  INR 1.32   Urine analysis:    Component Value Date/Time   COLORURINE YELLOW 06/14/2018 2348   APPEARANCEUR HAZY (A) 06/14/2018 2348   LABSPEC >1.046 (H) 06/14/2018 2348   PHURINE 5.0 06/14/2018 2348   GLUCOSEU NEGATIVE 06/14/2018 2348   HGBUR SMALL (A) 06/14/2018 2348   BILIRUBINUR NEGATIVE 06/14/2018 2348   KETONESUR NEGATIVE 06/14/2018 2348   PROTEINUR NEGATIVE 06/14/2018 2348   NITRITE NEGATIVE 06/14/2018 2348   LEUKOCYTESUR NEGATIVE 06/14/2018 2348    Recent Results (from the past 240 hour(s))  MRSA PCR Screening     Status: None   Collection Time: 06/14/18 10:32 PM  Result Value Ref Range Status   MRSA by PCR NEGATIVE NEGATIVE Final    Comment:        The GeneXpert MRSA Assay (FDA approved for NASAL specimens only), is one component of a comprehensive MRSA colonization surveillance program. It is not intended to diagnose MRSA infection nor to guide or monitor treatment for MRSA infections. Performed at [facility], [address] [place], [place] [address]   Culture, blood (x 2)     Status: None (Preliminary result)   Collection Time: 06/14/18 11:16 PM  Result Value Ref Range Status   Specimen Description NECK RIGHT  Final   Special Requests   Final    BOTTLES DRAWN AEROBIC AND ANAEROBIC Blood Culture adequate volume   Culture   Final    NO GROWTH < 12 HOURS Performed at [facility], [address], [place], [place] [address]    Report Status PENDING  Incomplete  Culture, blood (x 2)     Status: None (Preliminary  result)   Collection Time: 06/14/18 11:16 PM  Result Value Ref Range Status   Specimen Description BLOOD RIGHT HAND  Final   Special Requests   Final    BOTTLES DRAWN AEROBIC AND ANAEROBIC Blood Culture adequate volume   Culture   Final    NO GROWTH < 12 HOURS Performed at [facility], [address], [place], [place] [address]    Report Status PENDING  Incomplete     Radiology Studies: Ct Abdomen Pelvis W Contrast  Result Date: 06/14/2018 CLINICAL DATA:  84 year old with acute abdominal pain, generalized. EXAM: CT ABDOMEN AND PELVIS WITH CONTRAST TECHNIQUE: Multidetector CT imaging of the abdomen and pelvis was performed using the standard protocol following bolus administration of intravenous contrast. CONTRAST:  ISOVUE-300 IOPAMIDOL (ISOVUE-300) INJECTION 61% COMPARISON:  None. FINDINGS:  Lower chest: Filling defects in the right lower lobe airways that could represent aspiration or mucous plugging. There is atelectasis in both lower lobes. There appears to be volume loss in the right middle lobe. Coronary artery calcifications. Hepatobiliary: Perihepatic ascites. There is free air within the perihepatic ascites. Gallbladder is unremarkable. Poorly defined low-density structure in the right hepatic lobe measures 1.1 cm and nonspecific. Portal venous system is patent. Pancreas: Unremarkable. No pancreatic ductal dilatation or surrounding inflammatory changes. Spleen: Normal in size without focal abnormality. Adrenals/Urinary Tract: 2.5 cm nodule in the right adrenal gland is indeterminate. Large cyst involving the right kidney upper pole measures up to 5 cm. Scattered hypodensities in both kidneys probably represent additional renal cysts. Some of these hypodensities are indeterminate and too small to definitively characterize. No hydronephrosis. Urinary bladder is unremarkable. Stomach/Bowel: Evidence for extraluminal gas in the right upper abdomen adjacent to the liver. In addition, there  appears to be extraluminal gas and extraluminal stool in the right lower abdomen and pelvic region. Findings are suggestive for a perforation involving the sigmoid colon. This area of perforation is probably on sequence 2, image 61. The hepatic flexure is anterior to the liver. There appears to be a normal appendix in the right upper abdomen. Free fluid in the central mesentery on sequence 2 image 53. Mesenteric edema and a small amount of mesenteric fluid. 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 Atherosclerotic disease involving the aorta and visceral arteries without aortic aneurysm. The origin of the left renal artery is heavily calcified. No lymph node enlargement in the abdomen or pelvis. Reproductive: Small uterus with myometrial calcifications. No evidence for an adnexal mass but limited evaluation due to the bowel pathology in this area. Other: Small amount of free fluid in the abdomen. Musculoskeletal: Multilevel degenerative disease in lumbar spine with mild scoliosis. IMPRESSION: Perforated viscus. Evidence for a bowel perforation involving the sigmoid colon. There is free air and extraluminal stool associated with this perforated bowel. Small amount of ascites in the abdomen. Volume loss and mucous plugging in the right lower lobe. Indeterminate 2.5 cm right adrenal nodule. This may be an incidental finding unless the patient has a history of malignancy. A dedicated adrenal CT could be performed after patient's acute medical problems have been dealt with. 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 These results were called by telephone at the time of interpretation on 06/14/2018 at 4:33 pm to Dr. Particia Nearing , who verbally acknowledged these results. Electronically Signed   By: Richarda Overlie M.D.   On: 06/14/2018 16:44   Dg Chest Port 1 View  Result Date: 06/14/2018 CLINICAL DATA:  Central line placement EXAM: PORTABLE  CHEST 1 VIEW COMPARISON:  06/14/2018 FINDINGS: Cardiac shadow is within normal limits. New right jugular central line is noted extending just below the cavoatrial junction. No pneumothorax is seen. Calcified granuloma in the right apex is noted. No focal infiltrate or sizable effusion is seen. IMPRESSION: No acute abnormality noted following central line placement. Electronically Signed   By: Alcide Clever M.D.   On: 06/14/2018 20:17   Dg Chest Port 1 View  Result Date: 06/14/2018 CLINICAL DATA:  Preop testing. EXAM: PORTABLE CHEST 1 VIEW COMPARISON:  Radiographs of May 19, 2014. FINDINGS: Stable cardiomediastinal silhouette. Atherosclerosis of thoracic aorta is noted. No pneumothorax or pleural effusion is noted. Hypoinflation of the lungs is noted with mild bibasilar subsegmental atelectasis. Bony thorax is unremarkable. IMPRESSION:  Hypoinflation of the lungs with mild bibasilar subsegmental atelectasis. Aortic Atherosclerosis (ICD10-I70.0). Electronically Signed   By: Lupita Raider, M.D.   On: 06/14/2018 18:25    Scheduled Meds: . levothyroxine  50 mcg Intravenous QAC breakfast  . [START ON 06/16/2018] pneumococcal 23 valent vaccine  0.5 mL Intramuscular Tomorrow-1000   Continuous Infusions: . sodium chloride 100 mL/hr at 06/15/18 1650  . piperacillin-tazobactam (ZOSYN)  IV 3.375 g (06/15/18 1743)     LOS: 1 day    Time spent: 35 minutes. Greater than 50% of this time was spent in direct contact with the patient, coordinating care and discussing relevant ongoing clinical issues, including needs for NGT< NPO status, anticipated plan for colostomy, needs for antibiotics and when to start nutrition. I have discussed with other specialist involved in her care and updated family at bedside.   Vassie Loll, MD [facility] Pager [phone]  If 7PM-7AM, please contact night-coverage [email or web address] Password [email or web address] 06/15/2018, 10:30 PM

## 2018-06-15 NOTE — Progress Notes (Signed)
Pt is unable to perform IS at this time. Pt is sleepy and not wanting to wake up for instruction. Family at bedside. IS left in room for instruction in the morning

## 2018-06-16 ENCOUNTER — Encounter (HOSPITAL_COMMUNITY): Payer: Self-pay | Admitting: General Surgery

## 2018-06-16 DIAGNOSIS — R000 Tachycardia, unspecified: Secondary | ICD-10-CM

## 2018-06-16 LAB — BASIC METABOLIC PANEL
Anion gap: 6 (ref 5–15)
BUN: 14 mg/dL (ref 8–23)
CHLORIDE: 107 mmol/L (ref 98–111)
CO2: 23 mmol/L (ref 22–32)
CREATININE: 0.7 mg/dL (ref 0.44–1.00)
Calcium: 7.4 mg/dL — ABNORMAL LOW (ref 8.9–10.3)
GFR calc Af Amer: >60 mL/min (ref >60)
GFR calc non Af Amer: >60 mL/min (ref >60)
GLUCOSE: 83 mg/dL (ref 70–99)
POTASSIUM: 3.5 mmol/L (ref 3.5–5.1)
SODIUM: 136 mmol/L (ref 135–145)

## 2018-06-16 LAB — MAGNESIUM: Magnesium: 2.3 mg/dL (ref 1.7–2.4)

## 2018-06-16 MED ORDER — METOPROLOL TARTRATE 5 MG/5ML IV SOLN
2.5000 mg | Freq: Three times a day (TID) | INTRAVENOUS | Status: DC
  Administered 2018-06-16 – 2018-06-18 (×6): 2.5 mg via INTRAVENOUS
  Filled 2018-06-16 (×6): qty 5

## 2018-06-16 NOTE — Care Management Note (Signed)
Case Management Note  Patient Details  Name: Michelle RothmanDeloris H Wall MRN: [id number] Date of Birth: 08/10/1933  Subjective/Objective:     S/p partial sigmoid colectomy with end colostomy .  From home with family. Reports independence with ADL's. No assistive devices. Family reports the feel somewhat comfortable with colostomy. Family member is a LawyerCNA. [facility] They are agreeable to home health RN.  Would like [facility].                Action/Plan: CM following. Anticipate PT eval when appropriate. Patient will need Home health RN if DC's home.   /Expected Discharge Date:   06/19/2018               Expected Discharge Plan:  Home w Home Health Services  In-House Referral:     Discharge planning Services  CM Consult  Post Acute Care Choice:  Home Health Choice offered to:  Patient, Adult Children  DME Arranged:    DME Agency:     HH Arranged:  RN HH Agency:  [facility]  Status of Service:  In process, will continue to follow  If discussed at Long Length of Stay Meetings, dates discussed:    Additional Comments:  Sharunda Salmon, Chrystine OilerSharley Diane, RN 06/16/2018, 2:06 PM

## 2018-06-16 NOTE — Progress Notes (Signed)
PROGRESS NOTE    Michelle Wall  WUJ:[id number] DOB: [birth year] DOA: 06/14/2018 PCP: Mirna MiresHill, Gerald, MD    Brief Narrative:  84 y/o with PMH of HTN, hypothyroidism, tobacco abuse, glaucoma and tricuspid insufficiency; who presented to ED with abd pain. Found to have sepsis in the setting of perforated viscus. General surgery on board and patient is status post laparotomy and colostomy.  Assessment & Plan: 1-Sepsis [facility](HCC): in the setting of acute perforated viscus. -status post laparotomy with colostomy -continue IV antibiotics -remains NPO -continue NGT -follow general surgery rec's -replete electrolytes and follow trend; goal is to maintain Mg > 2 and K > 4  2-Tobacco abuse -cessation counseling provided -patient declined nicotine patch.  3-Essential hypertension -BP stable w/o needs for antihypertensive drugs currently -will continue to monitor VS  4-hypothyroidism -continue synthroid IV for now  5-hypokalemia and hypomagnesemia  -continue repletion as needed  -follow electrolytes trend   6-glaucoma -Continue Xalatan QHS.  7-tachycardia  -will add low dose lopressor and continue monitoring on telemetry  -follow electrolytes and replete them as needed    DVT prophylaxis: heparin  Code Status: Full Family Communication: daughters at bedside  Disposition Plan: will transfer to telemetry bed later today if remains stable; continue electrolytes repletion, continue NGT, continue IV antibiotics, follow general surgery rec's.   Consultants:   General surgery   Procedures:   S/p laparotomy with colostomy 7/23  Antimicrobials:  Anti-infectives (From admission, onward)   Start     Dose/Rate Route Frequency Ordered Stop   06/15/18 0100  piperacillin-tazobactam (ZOSYN) IVPB 3.375 g     3.375 g 12.5 mL/hr over 240 Minutes Intravenous Every 8 hours 06/14/18 2211 06/20/18 0059   06/15/18 0100  piperacillin-tazobactam (ZOSYN) IVPB 3.375 g  Status:  Discontinued     3.375 g 12.5 mL/hr over 240 Minutes Intravenous Every 8 hours 06/14/18 2204 06/14/18 2217   06/14/18 2145  cefoTEtan (CEFOTAN) 2 g in sodium chloride 0.9 % 100 mL IVPB  Status:  Discontinued     2 g 200 mL/hr over 30 Minutes Intravenous On call to O.R. 06/14/18 2139 06/14/18 2211   06/14/18 1645  piperacillin-tazobactam (ZOSYN) IVPB 3.375 g     3.375 g 100 mL/hr over 30 Minutes Intravenous  Once 06/14/18 1639 06/14/18 1723      Subjective: Afebrile, no CP, no SOB, no nausea, no vomiting. Mild abd pain.  Objective: Vitals:   06/16/18 0700 06/16/18 0717 06/16/18 0800 06/16/18 0900  BP: (!) 124/53  (!) 111/49 136/66  Pulse: 92 96 95 (!) 126  Resp: 15 19 18 20   Temp:  98.6 F (37 C)    TempSrc:  Oral    SpO2: 99% 100% 98% 99%  Weight:      Height:        Intake/Output Summary (Last 24 hours) at 06/16/2018 1023 Last data filed at 06/16/2018 0500 Gross per 24 hour  Intake 1715.83 ml  Output 1350 ml  Net 365.83 ml   Filed Weights   06/14/18 1233 06/14/18 2330 06/16/18 0500  Weight: 61.2 kg (135 lb) 69.2 kg (152 lb 8.9 oz) 72.3 kg (159 lb 6.3 oz)    Examination: General exam: Alert, awake, oriented x 3; reports no abd pain, no nausea, no vomiting. Still with NGT in place. Respiratory system: good air movement, no wheezing, no crackles; positive rhonchi. Cardiovascular system: tachycardia. No rubs or gallops. Patient with positive soft murmur. Gastrointestinal system: Abdomen is distended, soft and just mildly tenderness on palpation. No bowel  sounds. Mid-abdominal wound appreciated and w/o signs of drainage or surrounding erythema. Central nervous system: Alert and oriented. No focal neurological deficits. Extremities: No C/C/E, +pedal pulses Skin: No rashes or petechiae; mid abd wound as mentioned above. Psychiatry: Judgement and insight appear normal. Mood & affect appropriate.   Data Reviewed: I have personally reviewed following labs and imaging studies  CBC: Recent  Labs  Lab 06/14/18 1317 06/15/18 0348  WBC 6.1 8.6  NEUTROABS  --  7.6  HGB 16.0* 14.6  HCT 46.3* 43.4  MCV 92.2 92.3  PLT 231 198   Basic Metabolic Panel: Recent Labs  Lab 06/14/18 1317 06/15/18 0348  NA 135 138  K 3.1* 3.0*  CL 100 108  CO2 25 23  GLUCOSE 140* 130*  BUN 14 15  CREATININE 0.75 0.85  CALCIUM 9.2 7.5*  MG  --  1.5*  PHOS  --  2.6   GFR: Estimated Creatinine Clearance: 47.5 mL/min (by C-G formula based on SCr of 0.85 mg/dL).  Liver Function Tests: Recent Labs  Lab 06/14/18 1317 06/15/18 0348  AST 17 18  ALT 13 11  ALKPHOS 41 25*  BILITOT 0.9 0.7  PROT 7.7 5.3*  ALBUMIN 3.9 2.4*   Recent Labs  Lab 06/14/18 1317  LIPASE 28   Coagulation Profile: Recent Labs  Lab 06/14/18 2316  INR 1.32   Urine analysis:    Component Value Date/Time   COLORURINE YELLOW 06/14/2018 2348   APPEARANCEUR HAZY (A) 06/14/2018 2348   LABSPEC >1.046 (H) 06/14/2018 2348   PHURINE 5.0 06/14/2018 2348   GLUCOSEU NEGATIVE 06/14/2018 2348   HGBUR SMALL (A) 06/14/2018 2348   BILIRUBINUR NEGATIVE 06/14/2018 2348   KETONESUR NEGATIVE 06/14/2018 2348   PROTEINUR NEGATIVE 06/14/2018 2348   NITRITE NEGATIVE 06/14/2018 2348   LEUKOCYTESUR NEGATIVE 06/14/2018 2348    Recent Results (from the past 240 hour(s))  MRSA PCR Screening     Status: None   Collection Time: 06/14/18 10:32 PM  Result Value Ref Range Status   MRSA by PCR NEGATIVE NEGATIVE Final    Comment:        The GeneXpert MRSA Assay (FDA approved for NASAL specimens only), is one component of a comprehensive MRSA colonization surveillance program. It is not intended to diagnose MRSA infection nor to guide or monitor treatment for MRSA infections. Performed at [facility], [address] [place], [place] [address]   Culture, blood (x 2)     Status: None (Preliminary result)   Collection Time: 06/14/18 11:16 PM  Result Value Ref Range Status   Specimen Description NECK RIGHT  Final   Special  Requests   Final    BOTTLES DRAWN AEROBIC AND ANAEROBIC Blood Culture adequate volume   Culture   Final    NO GROWTH 2 DAYS Performed at [facility], [address] [place], [place] [address]    Report Status PENDING  Incomplete  Culture, blood (x 2)     Status: None (Preliminary result)   Collection Time: 06/14/18 11:16 PM  Result Value Ref Range Status   Specimen Description BLOOD RIGHT HAND  Final   Special Requests   Final    BOTTLES DRAWN AEROBIC AND ANAEROBIC Blood Culture adequate volume   Culture   Final    NO GROWTH 2 DAYS Performed at [facility], [address] [place], [place] [address]    Report Status PENDING  Incomplete     Radiology Studies: Ct Abdomen Pelvis W Contrast  Result Date: 06/14/2018 CLINICAL  DATA:  84 year old with acute abdominal pain, generalized. EXAM: CT ABDOMEN AND PELVIS WITH CONTRAST TECHNIQUE: Multidetector CT imaging of the abdomen and pelvis was performed using the standard protocol following bolus administration of intravenous contrast. CONTRAST:  ISOVUE-300 IOPAMIDOL (ISOVUE-300) INJECTION 61% COMPARISON:  None. FINDINGS: Lower chest: Filling defects in the right lower lobe airways that could represent aspiration or mucous plugging. There is atelectasis in both lower lobes. There appears to be volume loss in the right middle lobe. Coronary artery calcifications. Hepatobiliary: Perihepatic ascites. There is free air within the perihepatic ascites. Gallbladder is unremarkable. Poorly defined low-density structure in the right hepatic lobe measures 1.1 cm and nonspecific. Portal venous system is patent. Pancreas: Unremarkable. No pancreatic ductal dilatation or surrounding inflammatory changes. Spleen: Normal in size without focal abnormality. Adrenals/Urinary Tract: 2.5 cm nodule in the right adrenal gland is indeterminate. Large cyst involving the right kidney upper pole measures up to 5 cm. Scattered hypodensities in both kidneys probably  represent additional renal cysts. Some of these hypodensities are indeterminate and too small to definitively characterize. No hydronephrosis. Urinary bladder is unremarkable. Stomach/Bowel: Evidence for extraluminal gas in the right upper abdomen adjacent to the liver. In addition, there appears to be extraluminal gas and extraluminal stool in the right lower abdomen and pelvic region. Findings are suggestive for a perforation involving the sigmoid colon. This area of perforation is probably on sequence 2, image 61. The hepatic flexure is anterior to the liver. There appears to be a normal appendix in the right upper abdomen. Free fluid in the central mesentery on sequence 2 image 53. Mesenteric edema and a small amount of mesenteric fluid. 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 Atherosclerotic disease involving the aorta and visceral arteries without aortic aneurysm. The origin of the left renal artery is heavily calcified. No lymph node enlargement in the abdomen or pelvis. Reproductive: Small uterus with myometrial calcifications. No evidence for an adnexal mass but limited evaluation due to the bowel pathology in this area. Other: Small amount of free fluid in the abdomen. Musculoskeletal: Multilevel degenerative disease in lumbar spine with mild scoliosis. IMPRESSION: Perforated viscus. Evidence for a bowel perforation involving the sigmoid colon. There is free air and extraluminal stool associated with this perforated bowel. Small amount of ascites in the abdomen. Volume loss and mucous plugging in the right lower lobe. Indeterminate 2.5 cm right adrenal nodule. This may be an incidental finding unless the patient has a history of malignancy. A dedicated adrenal CT could be performed after patient's acute medical problems have been dealt with. 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 These results were called by  telephone at the time of interpretation on 06/14/2018 at 4:33 pm to Dr. Particia Nearing , who verbally acknowledged these results. Electronically Signed   By: Richarda Overlie M.D.   On: 06/14/2018 16:44   Dg Chest Port 1 View  Result Date: 06/14/2018 CLINICAL DATA:  Central line placement EXAM: PORTABLE CHEST 1 VIEW COMPARISON:  06/14/2018 FINDINGS: Cardiac shadow is within normal limits. New right jugular central line is noted extending just below the cavoatrial junction. No pneumothorax is seen. Calcified granuloma in the right apex is noted. No focal infiltrate or sizable effusion is seen. IMPRESSION: No acute abnormality noted following central line placement. Electronically Signed   By: Alcide Clever M.D.   On: 06/14/2018 20:17   Dg Chest Port 1 View  Result Date: 06/14/2018 CLINICAL DATA:  Preop testing. EXAM: PORTABLE CHEST 1 VIEW COMPARISON:  Radiographs of May 19, 2014. FINDINGS: Stable cardiomediastinal silhouette. Atherosclerosis of thoracic aorta is noted. No pneumothorax or pleural effusion is noted. Hypoinflation of the lungs is noted with mild bibasilar subsegmental atelectasis. Bony thorax is unremarkable. IMPRESSION: Hypoinflation of the lungs with mild bibasilar subsegmental atelectasis. Aortic Atherosclerosis (ICD10-I70.0). Electronically Signed   By: Lupita Raider, M.D.   On: 06/14/2018 18:25    Scheduled Meds: . heparin injection (subcutaneous)  5,000 Units Subcutaneous Q8H  . latanoprost  1 drop Both Eyes QHS  . levothyroxine  50 mcg Intravenous QAC breakfast  . metoprolol tartrate  2.5 mg Intravenous Q8H  . pneumococcal 23 valent vaccine  0.5 mL Intramuscular Tomorrow-1000   Continuous Infusions: . sodium chloride 100 mL/hr at 06/16/18 0438  . piperacillin-tazobactam (ZOSYN)  IV 3.375 g (06/16/18 0834)     LOS: 2 days    Time spent: 30 minutes   Vassie Loll, MD [facility] Pager [phone]  If 7PM-7AM, please contact night-coverage [email or web address] Password  [facility] 06/16/2018, 10:23 AM

## 2018-06-16 NOTE — Progress Notes (Signed)
[facility] Progress Note  2 Days Post-Op  Subjective: No major complaints. HR running a little high but has been off of cardiac meds. BP on aline correspond to the Cuff.  Ranging 90-110s while in room.   Objective: Vital signs in last 24 hours: Temp:  [98.6 F (37 C)-99.8 F (37.7 C)] 99.6 F (37.6 C) (07/25 1654) Pulse Rate:  [90-135] 90 (07/25 1654) Resp:  [14-27] 16 (07/25 1654) BP: (91-143)/(44-88) 136/66 (07/25 0900) SpO2:  [85 %-100 %] 98 % (07/25 1654) Arterial Line BP: (62-138)/(45-66) 97/66 (07/25 0900) Weight:  [159 lb 6.3 oz (72.3 kg)] 159 lb 6.3 oz (72.3 kg) (07/25 0500)    Intake/Output from previous day: 07/24 0701 - 07/25 0700 In: 1715.8 [I.V.:1283.3; IV Piggyback:432.5] Out: 1350 [Urine:1350] Intake/Output this shift: No intake/output data recorded.  General appearance: alert, cooperative and no distress Resp: normal work breathing GI: soft, mildly distended, appropriately tender, packing changed, tissue healthy without drainage, ostomy with edema no gas in bag  Lab Results:  Recent Labs    06/14/18 1317 06/15/18 0348  WBC 6.1 8.6  HGB 16.0* 14.6  HCT 46.3* 43.4  PLT 231 198   BMET Recent Labs    06/15/18 0348 06/16/18 1036  NA 138 136  K 3.0* 3.5  CL 108 107  CO2 23 23  GLUCOSE 130* 83  BUN 15 14  CREATININE 0.85 0.70  CALCIUM 7.5* 7.4*   PT/INR Recent Labs    06/14/18 2316  LABPROT 16.3*  INR 1.32    Studies/Results: Dg Chest Port 1 View  Result Date: 06/14/2018 CLINICAL DATA:  Central line placement EXAM: PORTABLE CHEST 1 VIEW COMPARISON:  06/14/2018 FINDINGS: Cardiac shadow is within normal limits. New right jugular central line is noted extending just below the cavoatrial junction. No pneumothorax is seen. Calcified granuloma in the right apex is noted. No focal infiltrate or sizable effusion is seen. IMPRESSION: No acute abnormality noted following central line placement. Electronically Signed   By: Alcide Clever M.D.   On: 06/14/2018 20:17   Dg Chest Port 1 View  Result Date: 06/14/2018 CLINICAL DATA:  Preop testing. EXAM: PORTABLE CHEST 1 VIEW COMPARISON:  Radiographs of May 19, 2014. FINDINGS: Stable cardiomediastinal silhouette. Atherosclerosis of thoracic aorta is noted. No pneumothorax or pleural effusion is noted. Hypoinflation of the lungs is noted with mild bibasilar subsegmental atelectasis. Bony thorax is unremarkable. IMPRESSION: Hypoinflation of the lungs with mild bibasilar subsegmental atelectasis. Aortic Atherosclerosis (ICD10-I70.0). Electronically Signed   By: Lupita Raider, M.D.   On: 06/14/2018 18:25    Anti-infectives: Anti-infectives (From admission, onward)   Start     Dose/Rate Route Frequency Ordered Stop   06/15/18 0100  piperacillin-tazobactam (ZOSYN) IVPB 3.375 g     3.375 g 12.5 mL/hr over 240 Minutes Intravenous Every 8 hours 06/14/18 2211 06/20/18 0059   06/15/18 0100  piperacillin-tazobactam (ZOSYN) IVPB 3.375 g  Status:  Discontinued     3.375 g 12.5 mL/hr over 240 Minutes Intravenous Every 8 hours 06/14/18 2204 06/14/18 2217   06/14/18 2145  cefoTEtan (CEFOTAN) 2 g in sodium chloride 0.9 % 100 mL IVPB  Status:  Discontinued     2 g 200 mL/hr over 30 Minutes Intravenous On call to O.R. 06/14/18 2139 06/14/18 2211   06/14/18 1645  piperacillin-tazobactam (ZOSYN) IVPB 3.375 g     3.375 g 100 mL/hr over 30 Minutes Intravenous  Once 06/14/18 1639 06/14/18 1723      Assessment/Plan: Michelle Wall is a  84 yo s/p partial sigmoid colectomy with end colostomy for perforated sigmoid colon with pathology saying it was from diverticulitis.  Doing fair. -PRN for pain -HR up, monitor, no chest pain or SOB, lopressor started, BP still a little soft, but improving  -Ileus, NPO, Ng in place, ostomy without gas -Lytes replaced -Foley for now given HR, can likely get out tomorrow if HR improved -A line and cuff corresponding, would get out once HR improved with lopressor   -Perioperative antibiotics for 5 days, no leukocytosis -BID packing wound -SCD, heparin sq -CVL is a little deep, could be contributing to HR if no other source identified, if no source found can consider removing, has 2 PIV   LOS: 2 days    Michelle Wall 06/16/2018

## 2018-06-17 LAB — BASIC METABOLIC PANEL
Anion gap: 7 (ref 5–15)
BUN: 14 mg/dL (ref 8–23)
CHLORIDE: 108 mmol/L (ref 98–111)
CO2: 22 mmol/L (ref 22–32)
Calcium: 7.5 mg/dL — ABNORMAL LOW (ref 8.9–10.3)
Creatinine, Ser: 0.57 mg/dL (ref 0.44–1.00)
GFR calc non Af Amer: >60 mL/min (ref >60)
Glucose, Bld: 72 mg/dL (ref 70–99)
Potassium: 3.2 mmol/L — ABNORMAL LOW (ref 3.5–5.1)
SODIUM: 137 mmol/L (ref 135–145)

## 2018-06-17 LAB — MAGNESIUM: MAGNESIUM: 2.5 mg/dL — AB (ref 1.7–2.4)

## 2018-06-17 LAB — PHOSPHORUS: PHOSPHORUS: 1.6 mg/dL — AB (ref 2.5–4.6)

## 2018-06-17 MED ORDER — POTASSIUM CHLORIDE 2 MEQ/ML IV SOLN: INTRAVENOUS | Status: DC

## 2018-06-17 MED ORDER — POTASSIUM PHOSPHATES 15 MMOLE/5ML IV SOLN
20.0000 mmol | Freq: Once | INTRAVENOUS | Status: AC
  Administered 2018-06-17: 20 mmol via INTRAVENOUS
  Filled 2018-06-17: qty 6.67

## 2018-06-17 MED ORDER — MAGNESIUM SULFATE 2 GM/50ML IV SOLN
2.0000 g | Freq: Once | INTRAVENOUS | Status: DC
Start: 2018-06-17 — End: 2018-06-17

## 2018-06-17 MED ORDER — MORPHINE SULFATE (PF) 2 MG/ML IV SOLN
2.0000 mg | INTRAVENOUS | Status: DC | PRN
  Administered 2018-06-18 – 2018-06-19 (×3): 2 mg via INTRAVENOUS
  Filled 2018-06-17 (×3): qty 1

## 2018-06-17 MED ORDER — KCL IN DEXTROSE-NACL 20-5-0.9 MEQ/L-%-% IV SOLN
INTRAVENOUS | Status: DC
  Administered 2018-06-17 – 2018-06-21 (×10): via INTRAVENOUS

## 2018-06-17 NOTE — Care Management Important Message (Signed)
Important Message  Patient Details  Name: Sharrie RothmanDeloris H Wagler MRN: [id number] Date of Birth: 02/17/1933   Medicare Important Message Given:  Yes    Takeem Krotzer, Chrystine OilerSharley Diane, RN 06/17/2018, 1:22 PM

## 2018-06-17 NOTE — Progress Notes (Addendum)
Patient's heartrate AFib 110's to 130's, MD notified. Patient asymptomatic; resting in bed, family members at bedside. Metoprolol scheduled to be given to patient at 2200. No new orders given by MD at this time, Metoprolol 2.5mg   IV given, will continue to monitor.

## 2018-06-17 NOTE — Progress Notes (Signed)
Patient admitted to 339 this evening at 1900. Placed on telemetry and HR noted to be in the 130s. On reassessment after patient resting in bed, HR in the 110s. No complaints from patient. States she is comfortable. Notified night shift RN to monitor. Text-paged mid-level provider to notify. Earnstine RegalAshley Aeon Kessner, RN

## 2018-06-17 NOTE — Progress Notes (Addendum)
[facility] Progress Note  3 Days Post-Op  Subjective: Ng with minimal output/ has  Not been changed. Ostomy without gas in bag. Sitting up in chair. CVL and aline out.   Objective: Vital signs in last 24 hours: Temp:  [98.5 F (36.9 C)-99.3 F (37.4 C)] 98.7 F (37.1 C) (07/26 1750) Pulse Rate:  [80-106] 80 (07/26 0900) Resp:  [12-33] 27 (07/26 0900) BP: (81-155)/(50-111) 140/59 (07/26 0900) SpO2:  [96 %-100 %] 99 % (07/26 0900) Arterial Line BP: (133-185)/(55-109) 142/62 (07/26 0900) Weight:  [154 lb 15.7 oz (70.3 kg)] 154 lb 15.7 oz (70.3 kg) (07/26 0453)    Intake/Output from previous day: 07/25 0701 - 07/26 0700 In: -  Out: 1000 [Urine:1000] Intake/Output this shift: Total I/O In: 487.4 [I.V.:178.8; IV Piggyback:308.7] Out: -   General appearance: alert, cooperative and no distress Resp: normal work breathing GI: soft, appropriately tender, ostomy edematous, no gas in bag, pink, midline with dressing  Lab Results:  Recent Labs    06/15/18 0348  WBC 8.6  HGB 14.6  HCT 43.4  PLT 198   BMET Recent Labs    06/16/18 1036 06/17/18 0405  NA 136 137  K 3.5 3.2*  CL 107 108  CO2 23 22  GLUCOSE 83 72  BUN 14 14  CREATININE 0.70 0.57  CALCIUM 7.4* 7.5*   PT/INR Recent Labs    06/14/18 2316  LABPROT 16.3*  INR 1.32      Anti-infectives: Anti-infectives (From admission, onward)   Start     Dose/Rate Route Frequency Ordered Stop   06/15/18 0100  piperacillin-tazobactam (ZOSYN) IVPB 3.375 g     3.375 g 12.5 mL/hr over 240 Minutes Intravenous Every 8 hours 06/14/18 2211 06/20/18 0059   06/15/18 0100  piperacillin-tazobactam (ZOSYN) IVPB 3.375 g  Status:  Discontinued     3.375 g 12.5 mL/hr over 240 Minutes Intravenous Every 8 hours 06/14/18 2204 06/14/18 2217   06/14/18 2145  cefoTEtan (CEFOTAN) 2 g in sodium chloride 0.9 % 100 mL IVPB  Status:  Discontinued     2 g 200 mL/hr over 30 Minutes Intravenous On call to O.R. 06/14/18 2139  06/14/18 2211   06/14/18 1645  piperacillin-tazobactam (ZOSYN) IVPB 3.375 g     3.375 g 100 mL/hr over 30 Minutes Intravenous  Once 06/14/18 1639 06/14/18 1723      Assessment/Plan: [name] is a 84 yo s/p partial sigmoid colectomy with end colostomy for perforated sigmoid colon from diverticulitis -PRN for pain -IS, OOB to chair now, PT ordered -HR improved -NPO, NG for now, may be able to take out tomorrow, will get Xray in AM -Perioperative Zosyn for 5 days post op -Lytes replaced -SCDs, Heparin sq   LOS: 3 days    Lucretia RoersLindsay C Quincy Prisco 06/17/2018

## 2018-06-17 NOTE — Progress Notes (Signed)
PROGRESS NOTE    Michelle Wall  ZOX:[id number] DOB: Feb 16, 1933 DOA: 06/14/2018 PCP: Mirna Mires, MD    Brief Narrative:  84 y/o with PMH of HTN, hypothyroidism, tobacco abuse, glaucoma and tricuspid insufficiency; who presented to ED with abd pain. Found to have sepsis in the setting of perforated viscus. General surgery on board and patient is status post laparotomy and colostomy.  Assessment & Plan: 1-Sepsis [facility]): in the setting of acute perforated viscus. -status post laparotomy with colostomy -continue IV antibiotics (last anticipated dose 7/29) -remains NPO -continue NGT -follow general surgery rec's -continue repletion of electrolytes and follow trend; goal is to maintain Mg > 2 and K > 4  2-Tobacco abuse -cessation counseling provided -patient declined nicotine patch.  3-Essential hypertension -BP stable  -will monitor -continue lopressor   4-hypothyroidism -continue synthroid IV for now  5-hypokalemia and hypomagnesemia  -continue repletion as needed  -follow electrolytes trend  -most likely associated with NGT and NPO status.  6-glaucoma -Continue Xalatan QHS.  7-tachycardia  -improved and controlled now with lopressor -follow electrolytes and replete them as needed  -will monitor on telemetry    DVT prophylaxis: heparin  Code Status: Full Family Communication: daughters at bedside  Disposition Plan: will transfer to telemetry; continue electrolytes repletion, continue NGT, continue IV antibiotics, follow general surgery rec's. Remove central line and A-line.  Consultants:   General surgery   Procedures:   S/p laparotomy with colostomy 7/23  Antimicrobials:  Anti-infectives (From admission, onward)   Start     Dose/Rate Route Frequency Ordered Stop   06/15/18 0100  piperacillin-tazobactam (ZOSYN) IVPB 3.375 g     3.375 g 12.5 mL/hr over 240 Minutes Intravenous Every 8 hours 06/14/18 2211 06/20/18 0059   06/15/18 0100   piperacillin-tazobactam (ZOSYN) IVPB 3.375 g  Status:  Discontinued     3.375 g 12.5 mL/hr over 240 Minutes Intravenous Every 8 hours 06/14/18 2204 06/14/18 2217   06/14/18 2145  cefoTEtan (CEFOTAN) 2 g in sodium chloride 0.9 % 100 mL IVPB  Status:  Discontinued     2 g 200 mL/hr over 30 Minutes Intravenous On call to O.R. 06/14/18 2139 06/14/18 2211   06/14/18 1645  piperacillin-tazobactam (ZOSYN) IVPB 3.375 g     3.375 g 100 mL/hr over 30 Minutes Intravenous  Once 06/14/18 1639 06/14/18 1723      Subjective: Afebrile, no CP, no SOB, no nausea, no vomiting. abd pain well controlled.   Objective: Vitals:   06/17/18 0453 06/17/18 0800 06/17/18 0816 06/17/18 0900  BP:  139/64  (!) 140/59  Pulse:  82  80  Resp:  17  (!) 27  Temp: 99.3 F (37.4 C)  98.5 F (36.9 C)   TempSrc: Oral  Oral   SpO2:  99%  99%  Weight: 70.3 kg (154 lb 15.7 oz)     Height:        Intake/Output Summary (Last 24 hours) at 06/17/2018 1136 Last data filed at 06/17/2018 0455 Gross per 24 hour  Intake -  Output 1000 ml  Net -1000 ml   Filed Weights   06/14/18 2330 06/16/18 0500 06/17/18 0453  Weight: 69.2 kg (152 lb 8.9 oz) 72.3 kg (159 lb 6.3 oz) 70.3 kg (154 lb 15.7 oz)    Examination: General exam: Alert, awake, oriented x 3; in no acute distress. NGT in place, no nausea, no vomiting; reports very little abd discomfort.  Respiratory system: good air movement; positive scattered rhonchi, normal resp effort. No wheezing.  Cardiovascular  system:RRR. No murmurs, rubs, gallops. Gastrointestinal system: Abdomen is nondistended, soft and with mild discomfort on palpation. Mid abd wound, without active drainage or surrounding erythema. Decrease but present BS on exam.  Central nervous system: Alert and oriented. No focal neurological deficits. Extremities: No C/C/E, +pedal pulses Skin: No rashes, no petechiae. LUQ colostomy bag in plca; no stools appreciated, no air.  Psychiatry: Judgement and insight  appear normal. Mood & affect appropriate.    Data Reviewed: I have personally reviewed following labs and imaging studies  CBC: Recent Labs  Lab 06/14/18 1317 06/15/18 0348  WBC 6.1 8.6  NEUTROABS  --  7.6  HGB 16.0* 14.6  HCT 46.3* 43.4  MCV 92.2 92.3  PLT 231 198   Basic Metabolic Panel: Recent Labs  Lab 06/14/18 1317 06/15/18 0348 06/16/18 1036 06/17/18 0405  NA 135 138 136 137  K 3.1* 3.0* 3.5 3.2*  CL 100 108 107 108  CO2 25 23 23 22   GLUCOSE 140* 130* 83 72  BUN 14 15 14 14   CREATININE 0.75 0.85 0.70 0.57  CALCIUM 9.2 7.5* 7.4* 7.5*  MG  --  1.5* 2.3 2.5*  PHOS  --  2.6  --  1.6*   GFR: Estimated Creatinine Clearance: 49.8 mL/min (by C-G formula based on SCr of 0.57 mg/dL).  Liver Function Tests: Recent Labs  Lab 06/14/18 1317 06/15/18 0348  AST 17 18  ALT 13 11  ALKPHOS 41 25*  BILITOT 0.9 0.7  PROT 7.7 5.3*  ALBUMIN 3.9 2.4*   Recent Labs  Lab 06/14/18 1317  LIPASE 28   Coagulation Profile: Recent Labs  Lab 06/14/18 2316  INR 1.32   Urine analysis:    Component Value Date/Time   COLORURINE YELLOW 06/14/2018 2348   APPEARANCEUR HAZY (A) 06/14/2018 2348   LABSPEC >1.046 (H) 06/14/2018 2348   PHURINE 5.0 06/14/2018 2348   GLUCOSEU NEGATIVE 06/14/2018 2348   HGBUR SMALL (A) 06/14/2018 2348   BILIRUBINUR NEGATIVE 06/14/2018 2348   KETONESUR NEGATIVE 06/14/2018 2348   PROTEINUR NEGATIVE 06/14/2018 2348   NITRITE NEGATIVE 06/14/2018 2348   LEUKOCYTESUR NEGATIVE 06/14/2018 2348    Recent Results (from the past 240 hour(s))  MRSA PCR Screening     Status: None   Collection Time: 06/14/18 10:32 PM  Result Value Ref Range Status   MRSA by PCR NEGATIVE NEGATIVE Final    Comment:        The GeneXpert MRSA Assay (FDA approved for NASAL specimens only), is one component of a comprehensive MRSA colonization surveillance program. It is not intended to diagnose MRSA infection nor to guide or monitor treatment for MRSA  infections. Performed at [facility], [address] [place], [place] [address]   Culture, blood (x 2)     Status: None (Preliminary result)   Collection Time: 06/14/18 11:16 PM  Result Value Ref Range Status   Specimen Description NECK RIGHT  Final   Special Requests   Final    BOTTLES DRAWN AEROBIC AND ANAEROBIC Blood Culture adequate volume   Culture   Final    NO GROWTH 3 DAYS Performed at [facility], [address] [place], [place] [address]    Report Status PENDING  Incomplete  Culture, blood (x 2)     Status: None (Preliminary result)   Collection Time: 06/14/18 11:16 PM  Result Value Ref Range Status   Specimen Description BLOOD RIGHT HAND  Final   Special Requests   Final    BOTTLES DRAWN AEROBIC AND  ANAEROBIC Blood Culture adequate volume   Culture   Final    NO GROWTH 3 DAYS Performed at [facility], [address] [place], [place] [address]    Report Status PENDING  Incomplete     Radiology Studies: No results found.  Scheduled Meds: . heparin injection (subcutaneous)  5,000 Units Subcutaneous Q8H  . latanoprost  1 drop Both Eyes QHS  . levothyroxine  50 mcg Intravenous QAC breakfast  . metoprolol tartrate  2.5 mg Intravenous Q8H   Continuous Infusions: . dextrose 5 % and 0.9% NaCl 1,000 mL with potassium chloride 20 mEq infusion    . piperacillin-tazobactam (ZOSYN)  IV 3.375 g (06/17/18 0901)  . potassium PHOSPHATE IVPB (in mmol)       LOS: 3 days    Time spent: 30 minutes  Vassie Loll, MD [facility] Pager [phone]  If 7PM-7AM, please contact night-coverage [email or web address] Password [facility] 06/17/2018, 11:36 AM

## 2018-06-17 NOTE — Consult Note (Signed)
WOC Nurse ostomy follow up Teaching session with patient and her two daughters, Eber JonesCarolyn and TorreonAnise, completed in [place]. Stoma type/location: LUQ colostomy Stomal assessment/size: 1 3/8 inch budded, all sutures intact Peristomal assessment: intact skin normal color and texture Treatment options for stomal/peristomal skin: barrier ring Output; scant amount of serosanginous Ostomy pouching: 2pc.  Education provided: Eber JonesCarolyn did the complete removal of existing pouch, cutting/fitting/application of new pouching system with stand-by assist from me.  Eber JonesCarolyn performed all aspects without difficulty.  Anise participated by watching.  All questions were answered to their expressed satisfaction.  Orders are in for supplies and I have requested the primary care RN to obtain additional barriers and pouches to place at the bedside. [place] in [place] Dis[place] program: Yes  Helmut MusterSherry Neosha Switalski, RN, MSN, [facility], CNS-BC, pager [phone]

## 2018-06-18 ENCOUNTER — Inpatient Hospital Stay (HOSPITAL_COMMUNITY): Payer: Medicare Other

## 2018-06-18 LAB — BASIC METABOLIC PANEL
ANION GAP: 6 (ref 5–15)
BUN: 9 mg/dL (ref 8–23)
CALCIUM: 7.7 mg/dL — AB (ref 8.9–10.3)
CO2: 24 mmol/L (ref 22–32)
CREATININE: 0.54 mg/dL (ref 0.44–1.00)
Chloride: 108 mmol/L (ref 98–111)
GFR calc Af Amer: >60 mL/min (ref >60)
GLUCOSE: 133 mg/dL — AB (ref 70–99)
Potassium: 3.1 mmol/L — ABNORMAL LOW (ref 3.5–5.1)
Sodium: 138 mmol/L (ref 135–145)

## 2018-06-18 LAB — MAGNESIUM: Magnesium: 2 mg/dL (ref 1.7–2.4)

## 2018-06-18 MED ORDER — METOPROLOL TARTRATE 5 MG/5ML IV SOLN
5.0000 mg | Freq: Three times a day (TID) | INTRAVENOUS | Status: DC
  Administered 2018-06-18 – 2018-06-20 (×7): 5 mg via INTRAVENOUS
  Filled 2018-06-18 (×7): qty 5

## 2018-06-18 MED ORDER — POTASSIUM CHLORIDE 10 MEQ/100ML IV SOLN
10.0000 meq | INTRAVENOUS | Status: AC
  Administered 2018-06-18 (×3): 10 meq via INTRAVENOUS
  Filled 2018-06-18 (×3): qty 100

## 2018-06-18 MED ORDER — SODIUM CHLORIDE 0.9 % IV BOLUS
500.0000 mL | Freq: Once | INTRAVENOUS | Status: AC
  Administered 2018-06-18: 500 mL via INTRAVENOUS

## 2018-06-18 MED ORDER — METOPROLOL TARTRATE 5 MG/5ML IV SOLN
2.5000 mg | Freq: Once | INTRAVENOUS | Status: AC
  Administered 2018-06-18: 2.5 mg via INTRAVENOUS
  Filled 2018-06-18: qty 5

## 2018-06-18 NOTE — Progress Notes (Signed)
PROGRESS NOTE    Michelle Wall LivingH Branagan  GNF:[id number] DOB: 1933-09-07 DOA: 06/14/2018 PCP: Mirna MiresHill, Gerald, MD    Brief Narrative:  84 y/o with PMH of HTN, hypothyroidism, tobacco abuse, glaucoma and tricuspid insufficiency; who presented to ED with abd pain. Found to have sepsis in the setting of perforated viscus. General surgery on board and patient is status post laparotomy and colostomy.  Assessment & Plan: 1-Sepsis [facility](HCC): in the setting of acute perforated viscus. -status post laparotomy with colostomy -continue IV antibiotics (last anticipated dose 7/29) -remains NPO, but will allow sips and ice chips. -d/c NGT -follow general surgery rec's -continue repletion of electrolytes and follow trend; goal is to maintain Mg > 2 and K > 4 -increase physical activity   2-Tobacco abuse -cessation counseling provided -patient declined nicotine patch.  3-Essential hypertension -BP stable  -will continue monitoring -continue lopressor IV   4-hypothyroidism -continue synthroid IV for now -looking to transition to PO in the next 24 hours.  5-hypokalemia and hypomagnesemia  -continue repletion as needed  -follow electrolytes trend  -NGT to be discontinued today.  6-glaucoma -Continue Xalatan QHS.  7-tachycardia  -Patient with tachycardia throughout the night -Telemetry personally reviewed; some P waves appreciated -will continue metoprolol (dose adjusted) -will check EKG -continue electrolytes repletion as needed (especially K and Mg)   DVT prophylaxis: heparin  Code Status: Full Family Communication: daughters at bedside  Disposition Plan: Remains inpatient.  After discussing her care with general surgery, the plan is to pursued removal of her NG tube and allow her to have some sips and ice chips.  Continue as needed pain medication and continue electrolytes repletion.  For now we will continue to minimize oral medications.  Increase physical activity and discontinue Foley  catheter.  Consultants:   General surgery   Procedures:   S/p laparotomy with colostomy 7/23  Antimicrobials:  Anti-infectives (From admission, onward)   Start     Dose/Rate Route Frequency Ordered Stop   06/15/18 0100  piperacillin-tazobactam (ZOSYN) IVPB 3.375 g     3.375 g 12.5 mL/hr over 240 Minutes Intravenous Every 8 hours 06/14/18 2211 06/20/18 0059   06/15/18 0100  piperacillin-tazobactam (ZOSYN) IVPB 3.375 g  Status:  Discontinued     3.375 g 12.5 mL/hr over 240 Minutes Intravenous Every 8 hours 06/14/18 2204 06/14/18 2217   06/14/18 2145  cefoTEtan (CEFOTAN) 2 g in sodium chloride 0.9 % 100 mL IVPB  Status:  Discontinued     2 g 200 mL/hr over 30 Minutes Intravenous On call to O.R. 06/14/18 2139 06/14/18 2211   06/14/18 1645  piperacillin-tazobactam (ZOSYN) IVPB 3.375 g     3.375 g 100 mL/hr over 30 Minutes Intravenous  Once 06/14/18 1639 06/14/18 1723      Subjective: No fever, no chest pain, no shortness of breath, no nausea, no vomiting.  Mild soreness in her abdomen reported; approximately 200 cc collected in the last 24 hours if her NG tube.   Objective: Vitals:   06/17/18 1750 06/17/18 1954 06/17/18 2141 06/18/18 0553  BP:  124/65 137/82 (!) 138/92  Pulse:  (!) 128 (!) 132 (!) 130  Resp:  (!) 26 (!) 26 16  Temp: 98.7 F (37.1 C) 99.4 F (37.4 C) 98.4 F (36.9 C) 99.1 F (37.3 C)  TempSrc: Oral Oral Oral Oral  SpO2:  94% 100% 92%  Weight:  69.4 kg (153 lb)    Height:  5\' 2"  (1.575 m)      Intake/Output Summary (Last 24 hours)  at 06/18/2018 1233 Last data filed at 06/18/2018 1148 Gross per 24 hour  Intake 487.44 ml  Output 1500 ml  Net -1012.56 ml   Filed Weights   06/16/18 0500 06/17/18 0453 06/17/18 1954  Weight: 72.3 kg (159 lb 6.3 oz) 70.3 kg (154 lb 15.7 oz) 69.4 kg (153 lb)    Examination: General exam: Alert, awake, oriented x 3; in no major distress.  Denies chest pain, nausea, vomiting, shortness of breath and palpitations.  Patient is  still with NG tube in place and reporting just mild soreness in her abdomen. Respiratory system: Good air movement bilaterally, normal respiratory effort.  No wheezing, no crackles, positive for scattered rhonchi appreciated on exam. Cardiovascular system: Mild tachycardia. No murmurs, rubs or gallops. Gastrointestinal system: Abdomen is sore with palpation, mid abdominal wound with clean dressings in place and demonstrating no superimposed infection.  Left upper quadrant with colostomy bag, mild serosanguineous material appreciated inside.  No guarding, decreased but present bowel sounds. Central nervous system: Alert and oriented. No focal neurological deficits. Extremities: No C/C/E, +pedal pulses Skin: No rashes, no petechiae; mid abdominal wound and colostomy on left side as mentioned above. Psychiatry: Judgement and insight appear normal. Mood & affect appropriate.    Data Reviewed: I have personally reviewed following labs and imaging studies  CBC: Recent Labs  Lab 06/14/18 1317 06/15/18 0348  WBC 6.1 8.6  NEUTROABS  --  7.6  HGB 16.0* 14.6  HCT 46.3* 43.4  MCV 92.2 92.3  PLT 231 198   Basic Metabolic Panel: Recent Labs  Lab 06/14/18 1317 06/15/18 0348 06/16/18 1036 06/17/18 0405 06/18/18 0701  NA 135 138 136 137 138  K 3.1* 3.0* 3.5 3.2* 3.1*  CL 100 108 107 108 108  CO2 25 23 23 22 24   GLUCOSE 140* 130* 83 72 133*  BUN 14 15 14 14 9   CREATININE 0.75 0.85 0.70 0.57 0.54  CALCIUM 9.2 7.5* 7.4* 7.5* 7.7*  MG  --  1.5* 2.3 2.5* 2.0  PHOS  --  2.6  --  1.6*  --    GFR: Estimated Creatinine Clearance: 49.5 mL/min (by C-G formula based on SCr of 0.54 mg/dL).  Liver Function Tests: Recent Labs  Lab 06/14/18 1317 06/15/18 0348  AST 17 18  ALT 13 11  ALKPHOS 41 25*  BILITOT 0.9 0.7  PROT 7.7 5.3*  ALBUMIN 3.9 2.4*   Recent Labs  Lab 06/14/18 1317  LIPASE 28   Coagulation Profile: Recent Labs  Lab 06/14/18 2316  INR 1.32   Urine analysis:      Component Value Date/Time   COLORURINE YELLOW 06/14/2018 2348   APPEARANCEUR HAZY (A) 06/14/2018 2348   LABSPEC >1.046 (H) 06/14/2018 2348   PHURINE 5.0 06/14/2018 2348   GLUCOSEU NEGATIVE 06/14/2018 2348   HGBUR SMALL (A) 06/14/2018 2348   BILIRUBINUR NEGATIVE 06/14/2018 2348   KETONESUR NEGATIVE 06/14/2018 2348   PROTEINUR NEGATIVE 06/14/2018 2348   NITRITE NEGATIVE 06/14/2018 2348   LEUKOCYTESUR NEGATIVE 06/14/2018 2348    Recent Results (from the past 240 hour(s))  MRSA PCR Screening     Status: None   Collection Time: 06/14/18 10:32 PM  Result Value Ref Range Status   MRSA by PCR NEGATIVE NEGATIVE Final    Comment:        The GeneXpert MRSA Assay (FDA approved for NASAL specimens only), is one component of a comprehensive MRSA colonization surveillance program. It is not intended to diagnose MRSA infection nor to guide or monitor  treatment for MRSA infections. Performed at [facility], [address] [place], [place] [address]   Culture, blood (x 2)     Status: None (Preliminary result)   Collection Time: 06/14/18 11:16 PM  Result Value Ref Range Status   Specimen Description NECK RIGHT  Final   Special Requests   Final    BOTTLES DRAWN AEROBIC AND ANAEROBIC Blood Culture adequate volume   Culture   Final    NO GROWTH 4 DAYS Performed at [facility], [address] [place], [place] [address]    Report Status PENDING  Incomplete  Culture, blood (x 2)     Status: None (Preliminary result)   Collection Time: 06/14/18 11:16 PM  Result Value Ref Range Status   Specimen Description BLOOD RIGHT HAND  Final   Special Requests   Final    BOTTLES DRAWN AEROBIC AND ANAEROBIC Blood Culture adequate volume   Culture   Final    NO GROWTH 4 DAYS Performed at [facility], [address], [place], [place] [address]    Report Status PENDING  Incomplete     Radiology Studies: Dg Abd 1 View  Result Date: 06/18/2018 CLINICAL DATA:  Follow-up ileus EXAM: ABDOMEN - 1 VIEW  COMPARISON:  06/14/2018 FINDINGS: Nasogastric catheter is noted within the stomach. Scattered large and small bowel gas is noted. No obstructive changes are seen. No free air is noted. Degenerative changes of lumbar spine are noted. IMPRESSION: No acute abnormality noted. Electronically Signed   By: Alcide Clever M.D.   On: 06/18/2018 08:30    Scheduled Meds: . heparin injection (subcutaneous)  5,000 Units Subcutaneous Q8H  . latanoprost  1 drop Both Eyes QHS  . levothyroxine  50 mcg Intravenous QAC breakfast  . metoprolol tartrate  5 mg Intravenous Q8H   Continuous Infusions: . dextrose 5 % and 0.9 % NaCl with KCl 20 mEq/L 75 mL/hr at 06/18/18 0936  . piperacillin-tazobactam (ZOSYN)  IV 3.375 g (06/18/18 0936)  . potassium chloride Stopped (06/18/18 1148)     LOS: 4 days    Time spent: 30 minutes  Vassie Loll, MD [facility] Pager [phone]  If 7PM-7AM, please contact night-coverage [email or web address] Password [facility] 06/18/2018, 12:33 PM

## 2018-06-18 NOTE — Plan of Care (Signed)
  Problem: Acute Rehab PT Goals(only PT should resolve) Goal: Pt Will Go Supine/Side To Sit Outcome: Progressing Flowsheets (Taken 06/18/2018 1238) Pt will go Supine/Side to Sit: with supervision Goal: Patient Will Transfer Sit To/From Stand Outcome: Progressing Flowsheets (Taken 06/18/2018 1238) Patient will transfer sit to/from stand: with supervision Goal: Pt Will Transfer Bed To Chair/Chair To Bed Outcome: Progressing Flowsheets (Taken 06/18/2018 1238) Pt will Transfer Bed to Chair/Chair to Bed: with supervision Goal: Pt Will Ambulate Outcome: Progressing Flowsheets (Taken 06/18/2018 1238) Pt will Ambulate: 75 feet;with supervision;with rolling walker   12:38 PM, 06/18/18 Ocie BobJames Avonna Iribe, MPT Physical Therapist with [facility] 336 [phone] office [phone] mobile phone

## 2018-06-18 NOTE — Evaluation (Signed)
Physical Therapy Evaluation Patient Details Name: Michelle Wall MRN: [id number] DOB: Nov 22, 1933 Today's Date: 06/18/2018   History of Present Illness  Michelle Wall is a 84 y.o. 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  Today patient presented with some nausea and abdominal discomfort.  She has not had any prior issues before.  Of note patient recently received some pain medication so is intermittently sleepy so not much history from her.  She denies any chest pain shortness breath fever cough or diarrhea.  Patient was found to have free air on imaging done in the ED.  Dr. Henreitta Leber general surgery evaluated patient and she was taken back to surgery stat.  She has a document history of tricuspid insufficiency pulmonary hypertension.  She has not followed up with cardiology in the recent years.  She has hypertension for which she takes meds and is compliant.  Postop patient was extubated  without any difficulty as far as her breathing is concerned per report from surgery.  Her blood pressure did drop some in surgery so art line was placed .  She did receive a few doses of vasopressin.  Lactic acid is elevated around 3.7.  Did receive Zosyn intraoperatively.      Clinical Impression  Patient demonstrates labored movement for sitting up at bedside requiring assist to pull self up, has to hang onto bed rail when standing up due to poor balance, required use of RW for safety during transfers and ambulation, limited for gait due to c/o fatigue and tolerated sitting up in chair with family member present after therapy - RN aware.  Patient will benefit from continued physical therapy in hospital and recommended venue below to increase strength, balance, endurance for safe ADLs and gait.    Follow Up Recommendations Home health PT;Supervision for mobility/OOB    Equipment Recommendations  Rolling walker with 5" wheels    Recommendations for Other Services       Precautions / Restrictions Precautions Precautions: Fall Restrictions Weight Bearing Restrictions: No      Mobility  Bed Mobility Overal bed mobility: Needs Assistance Bed Mobility: Supine to Sit     Supine to sit: Min assist     General bed mobility comments: head of bed slightly raised, had to use siderail  Transfers Overall transfer level: Needs assistance Equipment used: Rolling walker (2 wheeled) Transfers: Sit to/from [employer] Sit to Stand: Min assist Stand pivot transfers: Min assist       General transfer comment: has to lean on nearby objects for support, required use of RW for safety  Ambulation/Gait Ambulation/Gait assistance: Min guard Gait Distance (Feet): 40 Feet Assistive device: Rolling walker (2 wheeled) Gait Pattern/deviations: Decreased step length - right;Decreased step length - left;Decreased stride length Gait velocity: slow   General Gait Details: slow labored cadence without loss of balance, limited secondary to fatigue  Stairs            Wheelchair Mobility    Modified Rankin (Stroke Patients Only)       Balance Overall balance assessment: Needs assistance Sitting-balance support: Feet supported;No upper extremity supported Sitting balance-Leahy Scale: Good     Standing balance support: No upper extremity supported;During functional activity Standing balance-Leahy Scale: Poor Standing balance comment: fair with RW  Pertinent Vitals/Pain Pain Assessment: No/denies pain    Home Living Family/patient expects to be discharged to:: Private residence Living Arrangements: Children Available Help at Discharge: Family Type of Home: Mobile home Home Access: Stairs to enter Entrance Stairs-Rails: Right;Left;Can reach both Entrance Stairs-Number of Steps: 4 Home  Layout: One level Home Equipment: Cane - quad      Prior Function Level of Independence: Independent         Comments: community ambulator     Higher education careers adviserHand Dominance        Extremity/Trunk Assessment   Upper Extremity Assessment Upper Extremity Assessment: Generalized weakness    Lower Extremity Assessment Lower Extremity Assessment: Generalized weakness    Cervical / Trunk Assessment Cervical / Trunk Assessment: Normal  Communication   Communication: No difficulties  Cognition Arousal/Alertness: Awake/alert Behavior During Therapy: WFL for tasks assessed/performed Overall Cognitive Status: Within Functional Limits for tasks assessed                                        General Comments      Exercises     Assessment/Plan    PT Assessment Patient needs continued PT services  PT Problem List Decreased strength;Decreased activity tolerance;Decreased balance;Decreased mobility       PT Treatment Interventions Gait training;Stair training;Functional mobility training;Therapeutic activities;Therapeutic exercise;Patient/family education    PT Goals (Current goals can be found in the Care Plan section)  Acute Rehab PT Goals Patient Stated Goal: return home with family to assist PT Goal Formulation: With patient/family Time For Goal Achievement: 06/28/18 Potential to Achieve Goals: Good    Frequency Min 3X/week   Barriers to discharge        Co-evaluation               AM-PAC PT "6 Clicks" Daily Activity  Outcome Measure Difficulty turning over in bed (including adjusting bedclothes, sheets and blankets)?: A Little Difficulty moving from lying on back to sitting on the side of the bed? : A Little Difficulty sitting down on and standing up from a chair with arms (e.g., wheelchair, bedside commode, etc,.)?: A Little Help needed moving to and from a bed to chair (including a wheelchair)?: A Little Help needed walking in hospital room?: A  Little Help needed climbing 3-5 steps with a railing? : A Lot 6 Click Score: 17    End of Session   Activity Tolerance: Patient tolerated treatment well;Patient limited by fatigue Patient left: in chair;with call bell/phone within reach;with family/visitor present Nurse Communication: Mobility status;Other (comment)(RN aware that patient left up in chair) PT Visit Diagnosis: Unsteadiness on feet (R26.81);Other abnormalities of gait and mobility (R26.89);Muscle weakness (generalized) (M62.81)    Time: [id number] PT Time Calculation (min) (ACUTE ONLY): 28 min   Charges:   PT Evaluation $PT Eval Moderate Complexity: 1 Mod PT Treatments $Therapeutic Activity: 23-37 mins        12:35 PM, 06/18/18 Ocie BobJames Prisma Decarlo, MPT Physical Therapist [facility] [phone] office [phone] mobile phone

## 2018-06-18 NOTE — Progress Notes (Signed)
[facility] Progress Note  4 Days Post-Op  Subjective: Patient seen earlier today. No nausea and NG with minimal output. KUB with normal bowel gas pattern. NG and foley removed. Checked with RN later and had some retention, I&O performed with 100 cc urine out. Low for patient at this time, told RN to check with medical service/ could either given bolus versus lasix. They gave bolus of 500cc.   Objective: Vital signs in last 24 hours: Temp:  [98.6 F (37 C)-99.1 F (37.3 C)] 98.8 F (37.1 C) (07/27 2114) Pulse Rate:  [82-130] 95 (07/27 2114) Resp:  [20-24] 20 (07/27 2114) BP: (134-145)/(62-125) 145/125 (07/27 2114) SpO2:  [90 %-92 %] 90 % (07/27 2114)    Intake/Output from previous day: 07/26 0701 - 07/27 0700 In: 487.4 [I.V.:178.8; IV Piggyback:308.7] Out: 1300 [Urine:1300] Intake/Output this shift: No intake/output data recorded.  General appearance: alert, cooperative and no distress Resp: normal work breathing GI: soft, nondistended, midline wound packed, ostomy with some minimal sweat in bag, appropriately tender  Lab Results:  No results for input(s): WBC, HGB, HCT, PLT in the last 72 hours. BMET Recent Labs    06/17/18 0405 06/18/18 0701  NA 137 138  K 3.2* 3.1*  CL 108 108  CO2 22 24  GLUCOSE 72 133*  BUN 14 9  CREATININE 0.57 0.54  CALCIUM 7.5* 7.7*   PT/INR No results for input(s): LABPROT, INR in the last 72 hours.  Studies/Results: Dg Abd 1 View  Result Date: 06/18/2018 CLINICAL DATA:  Follow-up ileus EXAM: ABDOMEN - 1 VIEW COMPARISON:  06/14/2018 FINDINGS: Nasogastric catheter is noted within the stomach. Scattered large and small bowel gas is noted. No obstructive changes are seen. No free air is noted. Degenerative changes of lumbar spine are noted. IMPRESSION: No acute abnormality noted. Electronically Signed   By: Alcide CleverMark  Lukens M.D.   On: 06/18/2018 08:30    Anti-infectives: Anti-infectives (From admission, onward)   Start      Dose/Rate Route Frequency Ordered Stop   06/15/18 0100  piperacillin-tazobactam (ZOSYN) IVPB 3.375 g     3.375 g 12.5 mL/hr over 240 Minutes Intravenous Every 8 hours 06/14/18 2211 06/20/18 0059   06/15/18 0100  piperacillin-tazobactam (ZOSYN) IVPB 3.375 g  Status:  Discontinued     3.375 g 12.5 mL/hr over 240 Minutes Intravenous Every 8 hours 06/14/18 2204 06/14/18 2217   06/14/18 2145  cefoTEtan (CEFOTAN) 2 g in sodium chloride 0.9 % 100 mL IVPB  Status:  Discontinued     2 g 200 mL/hr over 30 Minutes Intravenous On call to O.R. 06/14/18 2139 06/14/18 2211   06/14/18 1645  piperacillin-tazobactam (ZOSYN) IVPB 3.375 g     3.375 g 100 mL/hr over 30 Minutes Intravenous  Once 06/14/18 1639 06/14/18 1723      Assessment/Plan: [name] is a 84 yo s/p partial sigmoid colectomy with end colostomy for perforated sigmoid colonfrom diverticulitis. Doing fair.  -PRN for pain -IS, OOB, PT  -NG out, foley out, I&O already done once  -Perioperative zosyn ending at POD 5 -lytes replaced -SCDs, heparin sq    LOS: 4 days    Michelle Wall 06/18/2018

## 2018-06-19 LAB — BASIC METABOLIC PANEL
Anion gap: 5 (ref 5–15)
BUN: 10 mg/dL (ref 8–23)
CO2: 23 mmol/L (ref 22–32)
CREATININE: 0.56 mg/dL (ref 0.44–1.00)
Calcium: 7.5 mg/dL — ABNORMAL LOW (ref 8.9–10.3)
Chloride: 113 mmol/L — ABNORMAL HIGH (ref 98–111)
GFR calc non Af Amer: >60 mL/min (ref >60)
Glucose, Bld: 134 mg/dL — ABNORMAL HIGH (ref 70–99)
Potassium: 3.6 mmol/L (ref 3.5–5.1)
Sodium: 141 mmol/L (ref 135–145)

## 2018-06-19 LAB — CULTURE, BLOOD (ROUTINE X 2)
Culture: NO GROWTH
Culture: NO GROWTH
Special Requests: ADEQUATE
Special Requests: ADEQUATE

## 2018-06-19 LAB — MAGNESIUM: Magnesium: 2 mg/dL (ref 1.7–2.4)

## 2018-06-19 NOTE — Progress Notes (Signed)
[facility] Progress Note  5 Days Post-Op  Subjective: Retention needing I&O last night and got bolus for low urine. Says she is voiding now. Feels good. Up in the chair. Wanting more to eat, no nausea.   Objective: Vital signs in last 24 hours: Temp:  [97.7 F (36.5 C)-98.8 F (37.1 C)] 98.1 F (36.7 C) (07/28 0604) Pulse Rate:  [82-147] 89 (07/28 0647) Resp:  [16-20] 18 (07/28 0604) BP: (100-145)/(60-125) 125/69 (07/28 0647) SpO2:  [90 %-95 %] 95 % (07/28 0604)    Intake/Output from previous day: 07/27 0701 - 07/28 0700 In: 2886.7 [I.V.:2550; IV Piggyback:336.7] Out: 625 [Urine:425; Emesis/NG output:200] Intake/Output this shift: No intake/output data recorded.  General appearance: alert, cooperative and no distress Resp: normal work breathing GI: soft, wound with packing without drainage or erythema, ostomy with some sweat in bag, pink  Lab Results:  No results for input(s): WBC, HGB, HCT, PLT in the last 72 hours. BMET Recent Labs    06/18/18 0701 06/19/18 0628  NA 138 141  K 3.1* 3.6  CL 108 113*  CO2 24 23  GLUCOSE 133* 134*  BUN 9 10  CREATININE 0.54 0.56  CALCIUM 7.7* 7.5*   PT/INR No results for input(s): LABPROT, INR in the last 72 hours.  Studies/Results: Dg Abd 1 View  Result Date: 06/18/2018 CLINICAL DATA:  Follow-up ileus EXAM: ABDOMEN - 1 VIEW COMPARISON:  06/14/2018 FINDINGS: Nasogastric catheter is noted within the stomach. Scattered large and small bowel gas is noted. No obstructive changes are seen. No free air is noted. Degenerative changes of lumbar spine are noted. IMPRESSION: No acute abnormality noted. Electronically Signed   By: Alcide CleverMark  Lukens M.D.   On: 06/18/2018 08:30    Anti-infectives: Anti-infectives (From admission, onward)   Start     Dose/Rate Route Frequency Ordered Stop   06/15/18 0100  piperacillin-tazobactam (ZOSYN) IVPB 3.375 g     3.375 g 12.5 mL/hr over 240 Minutes Intravenous Every 8 hours 06/14/18  2211 06/20/18 0059   06/15/18 0100  piperacillin-tazobactam (ZOSYN) IVPB 3.375 g  Status:  Discontinued     3.375 g 12.5 mL/hr over 240 Minutes Intravenous Every 8 hours 06/14/18 2204 06/14/18 2217   06/14/18 2145  cefoTEtan (CEFOTAN) 2 g in sodium chloride 0.9 % 100 mL IVPB  Status:  Discontinued     2 g 200 mL/hr over 30 Minutes Intravenous On call to O.R. 06/14/18 2139 06/14/18 2211   06/14/18 1645  piperacillin-tazobactam (ZOSYN) IVPB 3.375 g     3.375 g 100 mL/hr over 30 Minutes Intravenous  Once 06/14/18 1639 06/14/18 1723      Assessment/Plan: Michelle Wall is a 84 yo POD 5 s/p partial sigmoid colectomy with end colostomy for perforated sigmoid colonfrom diverticulitis. Looks great. PRN For pain IS OOB, PT Clears for now and can have creamer with coffee Told patient to go slow Perioperative zosyn to ending today SCDs, heparin sq Patient says she is urinating more now and BUN/ Cr looking good   LOS: 5 days    Michelle RoersLindsay C Solan Wall 06/19/2018

## 2018-06-19 NOTE — Progress Notes (Signed)
Afib noted on monitor. HR ranging 120-140's. Scheduled Metoprolol ordered for 6am. Will give shortly.

## 2018-06-19 NOTE — Progress Notes (Signed)
Tele called. HR 180's[facility] Bathing also being done. HR currently 130 shortly after returning to bed. Asymptomatic. Will continue to monitor.

## 2018-06-19 NOTE — Progress Notes (Signed)
Physical Therapy Treatment Patient Details Name: Michelle Wall MRN: [id number] DOB: 1933-10-20 Today's Date: 06/19/2018    History of Present Illness Michelle Wall Staff is a 84 y.o. 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  Today patient presented with some nausea and abdominal discomfort.  She has not had any prior issues before.  Of note patient recently received some pain medication so is intermittently sleepy so not much history from her.  She denies any chest pain shortness breath fever cough or diarrhea.  Patient was found to have free air on imaging done in the ED.  Dr. Henreitta Leber general surgery evaluated patient and she was taken back to surgery stat.  She has a document history of tricuspid insufficiency pulmonary hypertension.  She has not followed up with cardiology in the recent years.  She has hypertension for which she takes meds and is compliant.  Postop patient was extubated  without any difficulty as far as her breathing is concerned per report from surgery.  Her blood pressure did drop some in surgery so art line was placed .  She did receive a few doses of vasopressin.  Lactic acid is elevated around 3.7.  Did receive Zosyn intraoperatively.      PT Comments    Patient presents without NGT and indwelling foley catheter removed and overall states she feels better.  Patient continues to be unsteady when completing sit to stands and taking steps without use of an AD, requires use of RW for safety due to fall risk, demonstrates increased endurance/distance for taking steps and tolerated sitting up in chair after therapy with family member present in room.  Patient will benefit from continued physical therapy in hospital and recommended venue below to increase strength, balance, endurance for safe ADLs and gait.    Follow Up Recommendations   Home health PT;Supervision for mobility/OOB     Equipment Recommendations  Rolling walker with 5" wheels    Recommendations for Other Services       Precautions / Restrictions Precautions Precautions: Fall Restrictions Weight Bearing Restrictions: No    Mobility  Bed Mobility Overal bed mobility: Needs Assistance Bed Mobility: Supine to Sit     Supine to sit: Min guard;Min assist     General bed mobility comments: demonstrates improvement for propping up on elbows to hand without using siderail  Transfers Overall transfer level: Needs assistance Equipment used: Rolling walker (2 wheeled) Transfers: Sit to/from [employer] Sit to Stand: Min guard;Min assist Stand pivot transfers: Min guard       General transfer comment: still unsteady on feet and requires the use of RW  Ambulation/Gait Ambulation/Gait assistance: Min guard Gait Distance (Feet): 50 Feet Assistive device: Rolling walker (2 wheeled) Gait Pattern/deviations: Decreased step length - right;Decreased step length - left;WFL(Within Functional Limits) Gait velocity: slow   General Gait Details: demonstrates slightly increased endurance/distance for gait training with slow labored cadence without loss of balance, limited secondary to fatigue   Stairs             Wheelchair Mobility    Modified Rankin (Stroke Patients Only)       Balance Overall balance assessment: Needs assistance Sitting-balance support: Feet supported;No upper extremity supported Sitting balance-Leahy Scale: Good     Standing balance support: Bilateral upper extremity supported;During functional activity Standing balance-Leahy Scale: Fair Standing balance comment: poor without AD  Cognition Arousal/Alertness: Awake/alert Behavior During Therapy: WFL for tasks assessed/performed Overall Cognitive Status: Within Functional Limits for tasks assessed                                         Exercises General Exercises - Lower Extremity Long Arc Quad: Seated;AROM;Strengthening;Both;10 reps Hip Flexion/Marching: Seated;AROM;Strengthening;Both;10 reps Toe Raises: Seated;AROM;Strengthening;Both;10 reps Heel Raises: Seated;AROM;Strengthening;Both;10 reps    General Comments        Pertinent Vitals/Pain Pain Assessment: No/denies pain    Home Living                      Prior Function            PT Goals (current goals can now be found in the care plan section) Acute Rehab PT Goals Patient Stated Goal: return home with family to assist PT Goal Formulation: With patient/family Time For Goal Achievement: 06/28/18 Potential to Achieve Goals: Good Progress towards PT goals: Progressing toward goals    Frequency    Min 3X/week      PT Plan Current plan remains appropriate    Co-evaluation              AM-PAC PT "6 Clicks" Daily Activity  Outcome Measure  Difficulty turning over in bed (including adjusting bedclothes, sheets and blankets)?: A Little Difficulty moving from lying on back to sitting on the side of the bed? : A Little Difficulty sitting down on and standing up from a chair with arms (e.g., wheelchair, bedside commode, etc,.)?: A Little Help needed moving to and from a bed to chair (including a wheelchair)?: A Little Help needed walking in hospital room?: A Little Help needed climbing 3-5 steps with a railing? : A Lot 6 Click Score: 17    End of Session   Activity Tolerance: Patient tolerated treatment well;Patient limited by fatigue Patient left: in chair;with call bell/phone within reach;with family/visitor present Nurse Communication: Mobility status PT Visit Diagnosis: Unsteadiness on feet (R26.81);Other abnormalities of gait and mobility (R26.89);Muscle weakness (generalized) (M62.81)     Time: [id number] PT Time Calculation (min) (ACUTE ONLY): 29 min  Charges:  $Therapeutic  Exercise: 8-22 mins $Therapeutic Activity: 8-22 mins                     10:34 AM, 06/19/18 Ocie BobJames Ramiah Helfrich, MPT Physical Therapist [facility] [phone] office [phone] mobile phone

## 2018-06-19 NOTE — Progress Notes (Signed)
PROGRESS NOTE    Angelo ARIADNE RISSMILLER  ZOX:[id number] DOB: 12/09/1932 DOA: 06/14/2018 PCP: Mirna Mires, MD    Brief Narrative:  84 y/o with PMH of HTN, hypothyroidism, tobacco abuse, glaucoma and tricuspid insufficiency; who presented to ED with abd pain. Found to have sepsis in the setting of perforated viscus. General surgery on board and patient is status post laparotomy and colostomy.  Assessment & Plan: 1-Sepsis [facility]): in the setting of acute perforated viscus. -status post laparotomy with colostomy -complete antibiotic therapy as scheduled  -Patient diet to be advanced to clear liquid -Follow-up tolerance before transition medications to p.o. -follow general surgery rec's -continue repletion of electrolytes and follow trend; goal is to maintain Mg > 2 and K > 4 -increase physical activity   2-Tobacco abuse -cessation counseling provided -patient declined nicotine patch.  3-Essential hypertension -BP stable  -will continue monitoring -continue lopressor IV   4-hypothyroidism -continue synthroid IV for now -looking to transition to PO once tolerating by mouth diet.  5-hypokalemia and hypomagnesemia  -Electrolytes within normal limits now -Continue repletion as needed -Follow electrolytes trend.  6-glaucoma -Continue Xalatan QHS.  7-tachycardia  -Much better control and within normal limits with adjusted dose of metoprolol. -EKG demonstrated normal sinus rhythm. -continue electrolytes repletion as needed (especially K and Mg)  8-decrease in urine output -bladder scan done and demonstrating no retention -will increase IVF's rate  DVT prophylaxis: heparin  Code Status: Full Family Communication: daughters at bedside  Disposition Plan: Remains inpatient.  After discussing her care with general surgery, the plan is to advance diet to clears. Continue electrolytes repletion and complete antibiotic therapy.   Consultants:   General surgery   Procedures:   S/p  laparotomy with colostomy 7/23  Antimicrobials:  Anti-infectives (From admission, onward)   Start     Dose/Rate Route Frequency Ordered Stop   06/15/18 0100  piperacillin-tazobactam (ZOSYN) IVPB 3.375 g     3.375 g 12.5 mL/hr over 240 Minutes Intravenous Every 8 hours 06/14/18 2211 06/20/18 0059   06/15/18 0100  piperacillin-tazobactam (ZOSYN) IVPB 3.375 g  Status:  Discontinued     3.375 g 12.5 mL/hr over 240 Minutes Intravenous Every 8 hours 06/14/18 2204 06/14/18 2217   06/14/18 2145  cefoTEtan (CEFOTAN) 2 g in sodium chloride 0.9 % 100 mL IVPB  Status:  Discontinued     2 g 200 mL/hr over 30 Minutes Intravenous On call to O.R. 06/14/18 2139 06/14/18 2211   06/14/18 1645  piperacillin-tazobactam (ZOSYN) IVPB 3.375 g     3.375 g 100 mL/hr over 30 Minutes Intravenous  Once 06/14/18 1639 06/14/18 1723      Subjective: No fever, no chest pain, no shortness of breath, no nausea, no vomiting and no palpitations.  Having just mild soreness in her abdomen.  Per nursing staff decreasing her urine output after Foley catheter removed on 7/27.  Objective: Vitals:   06/19/18 0512 06/19/18 0604 06/19/18 0647 06/19/18 1438  BP:  113/64 125/69 (!) 145/72  Pulse: (!) 111 (!) 117 89 82  Resp:  18  20  Temp:  98.1 F (36.7 C)  98.8 F (37.1 C)  TempSrc:  Oral  Oral  SpO2:  95%  100%  Weight:      Height:        Intake/Output Summary (Last 24 hours) at 06/19/2018 1747 Last data filed at 06/19/2018 1524 Gross per 24 hour  Intake 1578.75 ml  Output 400 ml  Net 1178.75 ml   [employer]  06/16/18 0500 06/17/18 0453 06/17/18 1954  Weight: 72.3 kg (159 lb 6.3 oz) 70.3 kg (154 lb 15.7 oz) 69.4 kg (153 lb)    Examination: General exam: Alert, awake, oriented x 3; feeling much better in no acute distress.  Denies nausea, vomiting shortness of breath and chest pain.  Some decrease in her urine output has been reported by nursing staff. Respiratory system: Clear to auscultation. Respiratory  effort normal. Cardiovascular system:Rate controlled, No murmurs, rubs or gallops. Gastrointestinal system: Abdomen is nondistended, soft and just mildly tender around incision. No organomegaly or masses felt. Decrease but present BS appreciated. LUQ colostomy bag without feces and mid abd wound with clean dressings and binder. Central nervous system: Alert and oriented. No focal neurological deficits.  Cranial nerves grossly intact. Extremities: No cyanosis or clubbing Skin: No sheets or petechiae. Psychiatry: Judgement and insight appear normal. Mood & affect appropriate.   Data Reviewed: I have personally reviewed following labs and imaging studies  CBC: Recent Labs  Lab 06/14/18 1317 06/15/18 0348  WBC 6.1 8.6  NEUTROABS  --  7.6  HGB 16.0* 14.6  HCT 46.3* 43.4  MCV 92.2 92.3  PLT 231 198   Basic Metabolic Panel: Recent Labs  Lab 06/15/18 0348 06/16/18 1036 06/17/18 0405 06/18/18 0701 06/19/18 0628  NA 138 136 137 138 141  K 3.0* 3.5 3.2* 3.1* 3.6  CL 108 107 108 108 113*  CO2 23 23 22 24 23   GLUCOSE 130* 83 72 133* 134*  BUN 15 14 14 9 10   CREATININE 0.85 0.70 0.57 0.54 0.56  CALCIUM 7.5* 7.4* 7.5* 7.7* 7.5*  MG 1.5* 2.3 2.5* 2.0 2.0  PHOS 2.6  --  1.6*  --   --    GFR: Estimated Creatinine Clearance: 49.5 mL/min (by C-G formula based on SCr of 0.56 mg/dL).  Liver Function Tests: Recent Labs  Lab 06/14/18 1317 06/15/18 0348  AST 17 18  ALT 13 11  ALKPHOS 41 25*  BILITOT 0.9 0.7  PROT 7.7 5.3*  ALBUMIN 3.9 2.4*   Recent Labs  Lab 06/14/18 1317  LIPASE 28   Coagulation Profile: Recent Labs  Lab 06/14/18 2316  INR 1.32   Urine analysis:    Component Value Date/Time   COLORURINE YELLOW 06/14/2018 2348   APPEARANCEUR HAZY (A) 06/14/2018 2348   LABSPEC >1.046 (H) 06/14/2018 2348   PHURINE 5.0 06/14/2018 2348   GLUCOSEU NEGATIVE 06/14/2018 2348   HGBUR SMALL (A) 06/14/2018 2348   BILIRUBINUR NEGATIVE 06/14/2018 2348   KETONESUR NEGATIVE  06/14/2018 2348   PROTEINUR NEGATIVE 06/14/2018 2348   NITRITE NEGATIVE 06/14/2018 2348   LEUKOCYTESUR NEGATIVE 06/14/2018 2348    Recent Results (from the past 240 hour(s))  MRSA PCR Screening     Status: None   Collection Time: 06/14/18 10:32 PM  Result Value Ref Range Status   MRSA by PCR NEGATIVE NEGATIVE Final    Comment:        The GeneXpert MRSA Assay (FDA approved for NASAL specimens only), is one component of a comprehensive MRSA colonization surveillance program. It is not intended to diagnose MRSA infection nor to guide or monitor treatment for MRSA infections. Performed at [facility], [address] [place], [place] [address]   Culture, blood (x 2)     Status: None   Collection Time: 06/14/18 11:16 PM  Result Value Ref Range Status   Specimen Description NECK RIGHT  Final   Special Requests   Final    BOTTLES DRAWN AEROBIC  AND ANAEROBIC Blood Culture adequate volume   Culture   Final    NO GROWTH 5 DAYS Performed at [facility], [address], [place], [place] [address]    Report Status 06/19/2018 FINAL  Final  Culture, blood (x 2)     Status: None   Collection Time: 06/14/18 11:16 PM  Result Value Ref Range Status   Specimen Description BLOOD RIGHT HAND  Final   Special Requests   Final    BOTTLES DRAWN AEROBIC AND ANAEROBIC Blood Culture adequate volume   Culture   Final    NO GROWTH 5 DAYS Performed at [facility], [address] [place], [place] [address]    Report Status 06/19/2018 FINAL  Final     Radiology Studies: Dg Abd 1 View  Result Date: 06/18/2018 CLINICAL DATA:  Follow-up ileus EXAM: ABDOMEN - 1 VIEW COMPARISON:  06/14/2018 FINDINGS: Nasogastric catheter is noted within the stomach. Scattered large and small bowel gas is noted. No obstructive changes are seen. No free air is noted. Degenerative changes of lumbar spine are noted. IMPRESSION: No acute abnormality noted. Electronically Signed   By: Alcide Clever M.D.   On: 06/18/2018 08:30      Scheduled Meds: . heparin injection (subcutaneous)  5,000 Units Subcutaneous Q8H  . latanoprost  1 drop Both Eyes QHS  . levothyroxine  50 mcg Intravenous QAC breakfast  . metoprolol tartrate  5 mg Intravenous Q8H   Continuous Infusions: . dextrose 5 % and 0.9 % NaCl with KCl 20 mEq/L 100 mL/hr at 06/19/18 1048  . piperacillin-tazobactam (ZOSYN)  IV Stopped (06/19/18 1202)     LOS: 5 days    Time spent: 30 minutes  Vassie Loll, MD [facility] Pager [phone]  If 7PM-7AM, please contact night-coverage [email or web address] Password [facility] 06/19/2018, 5:47 PM

## 2018-06-20 LAB — BASIC METABOLIC PANEL
Anion gap: 3 — ABNORMAL LOW (ref 5–15)
BUN: 9 mg/dL (ref 8–23)
CHLORIDE: 114 mmol/L — AB (ref 98–111)
CO2: 26 mmol/L (ref 22–32)
CREATININE: 0.58 mg/dL (ref 0.44–1.00)
Calcium: 7.9 mg/dL — ABNORMAL LOW (ref 8.9–10.3)
Glucose, Bld: 129 mg/dL — ABNORMAL HIGH (ref 70–99)
POTASSIUM: 3.9 mmol/L (ref 3.5–5.1)
SODIUM: 143 mmol/L (ref 135–145)

## 2018-06-20 MED ORDER — LEVOTHYROXINE SODIUM 100 MCG PO TABS
100.0000 ug | ORAL_TABLET | Freq: Every day | ORAL | Status: DC
  Administered 2018-06-21 – 2018-06-22 (×2): 100 ug via ORAL
  Filled 2018-06-20 (×2): qty 1

## 2018-06-20 MED ORDER — VERAPAMIL HCL ER 240 MG PO TBCR
240.0000 mg | EXTENDED_RELEASE_TABLET | Freq: Two times a day (BID) | ORAL | Status: DC
  Administered 2018-06-20 – 2018-06-22 (×4): 240 mg via ORAL
  Filled 2018-06-20 (×4): qty 1

## 2018-06-20 NOTE — Progress Notes (Signed)
[facility] Progress Note  6 Days Post-Op  Subjective: No major complaints. No nausea/vomiting. Does not feel bloated. Took in 800+ po but no gas in bag. Working with PT. Urinating.   Objective: Vital signs in last 24 hours: Temp:  [98.4 F (36.9 C)-98.8 F (37.1 C)] 98.4 F (36.9 C) (07/29 0523) Pulse Rate:  [82-95] 89 (07/29 0523) Resp:  [18-20] 20 (07/29 0523) BP: (145-159)/(68-81) 159/81 (07/29 0523) SpO2:  [90 %-100 %] 90 % (07/29 0523)    Intake/Output from previous day: 07/28 0701 - 07/29 0700 In: 2950 [P.O.:820; I.V.:2080; IV Piggyback:50] Out: 550 [Urine:550] Intake/Output this shift: Total I/O In: 240 [P.O.:240] Out: -   General appearance: alert, cooperative and no distress Resp: normal work breathing GI: soft, mildly distended, nontender, midline with dressing, ostomy edematous, no gas in bag, sweat  Lab Results:  No results for input(s): WBC, HGB, HCT, PLT in the last 72 hours. BMET Recent Labs    06/19/18 0628 06/20/18 0543  NA 141 143  K 3.6 3.9  CL 113* 114*  CO2 23 26  GLUCOSE 134* 129*  BUN 10 9  CREATININE 0.56 0.58  CALCIUM 7.5* 7.9*    Assessment/Plan: Michelle Wall [age] yo POD 8 s/p partial sigmoid colectomy with end colostomy for perforated sigmoid colonfrom diverticulitis. Overall looking good and tolerating clears. No nausea/vomiting. Some mild bloating. -PRN for pain -OOB, ambulate as tolerated -Clears continue until making gas in bag, RN instructed to call and we can adv -No fevers or signs of infection, if continues to have ileus may need to investigate for intraabdominal abscess forming, has not had CBC in 4 days, will order one for tomorrow to assess. If WBC up, may need to order CT to make sure no collection that needs drainage.  -SCDs, heparin sq     LOS: 6 days    Michelle Wall 06/20/2018

## 2018-06-20 NOTE — Progress Notes (Signed)
PROGRESS NOTE    Michelle Wall  ZOX:[id number] DOB: May 18, 1933 DOA: 06/14/2018 PCP: Mirna Mires, MD    Brief Narrative:  84 y/o with PMH of HTN, hypothyroidism, tobacco abuse, glaucoma and tricuspid insufficiency; who presented to ED with abd pain. Found to have sepsis in the setting of perforated viscus. General surgery on board and patient is status post laparotomy and colostomy.  Assessment & Plan: 1-Sepsis [facility]): in the setting of acute perforated viscus. -status post laparotomy with colostomy -complete antibiotic therapy as scheduled  -Patient tolerating CLD -will transition medications to PO -follow general surgery rec's -continue repletion of electrolytes and follow trend; goal is to keep Mg > 2 and K > 4 -continue to increase physical activity   2-Tobacco abuse -cessation counseling provided -patient declined nicotine patch.  3-Essential hypertension -BP stable and HR well controlled -will resume home oral medications regimen  4-hypothyroidism -continue synthroid; now transition to PO  5-hypokalemia and hypomagnesemia  -Electrolytes within normal limits now -Continue repletion as needed -Follow electrolytes trend.  6-glaucoma -Continue Xalatan QHS.  7-tachycardia  -will resume home verapamil -EKG demonstrated normal sinus rhythm. -continue electrolytes repletion as needed (especially K and Mg) -HR stable and well control  8-decrease in urine output -continue IVF's for now -patient reproted increase in urine output.  DVT prophylaxis: heparin  Code Status: Full Family Communication: daughters at bedside  Disposition Plan: Remains inpatient.  After discussing her care with general surgery, the plan is to continue CLD. Continue electrolytes repletion and transition medications to oral route.  Consultants:   General surgery   Procedures:   S/p laparotomy with colostomy 7/23  Antimicrobials:  Anti-infectives (From admission, onward)   Start      Dose/Rate Route Frequency Ordered Stop   06/15/18 0100  piperacillin-tazobactam (ZOSYN) IVPB 3.375 g     3.375 g 12.5 mL/hr over 240 Minutes Intravenous Every 8 hours 06/14/18 2211 06/19/18 2200   06/15/18 0100  piperacillin-tazobactam (ZOSYN) IVPB 3.375 g  Status:  Discontinued     3.375 g 12.5 mL/hr over 240 Minutes Intravenous Every 8 hours 06/14/18 2204 06/14/18 2217   06/14/18 2145  cefoTEtan (CEFOTAN) 2 g in sodium chloride 0.9 % 100 mL IVPB  Status:  Discontinued     2 g 200 mL/hr over 30 Minutes Intravenous On call to O.R. 06/14/18 2139 06/14/18 2211   06/14/18 1645  piperacillin-tazobactam (ZOSYN) IVPB 3.375 g     3.375 g 100 mL/hr over 30 Minutes Intravenous  Once 06/14/18 1639 06/14/18 1723      Subjective: No fever, no chest pain, no shortness of breath, no nausea, no vomiting and no palpitations.  Patient is doing better and has tolerated her clear liquid diets.  Objective: Vitals:   06/19/18 1438 06/19/18 2135 06/20/18 0523 06/20/18 1322  BP: (!) 145/72 (!) 145/68 (!) 159/81 (!) 144/97  Pulse: 82 95 89 (!) 133  Resp: 20 18 20 16   Temp: 98.8 F (37.1 C)  98.4 F (36.9 C) 98.4 F (36.9 C)  TempSrc: Oral  Oral Oral  SpO2: 100% 94% 90% 95%  Weight:      Height:        Intake/Output Summary (Last 24 hours) at 06/20/2018 1735 Last data filed at 06/20/2018 1549 Gross per 24 hour  Intake 2710 ml  Output 850 ml  Net 1860 ml   [employer]   06/16/18 0500 06/17/18 0453 06/17/18 1954  Weight: 72.3 kg (159 lb 6.3 oz) 70.3 kg (154 lb 15.7 oz) 69.4 kg (  153 lb)    Examination: General exam: Alert, awake, oriented x 3; no nausea, no vomiting, tolerating clear liquid diets.  Reports very little abdominal discomfort.  Expressed increasing her urine output. Respiratory system: Clear to auscultation. Respiratory effort normal. Cardiovascular system:RRR. No rubs or gallops; no JVD. Gastrointestinal system: Abdomen is nondistended, No organomegaly or masses felt.  Increased  blood present bowel sounds appreciated.  Left upper quadrant colostomy bag with serosanguineous material and mid abdominal wound without signs of superimposed infection and clean dressings in place. Central nervous system: Alert and oriented. No focal neurological deficits. Extremities: No C/C/E, +pedal pulses Skin: No rashes, no petechiae Psychiatry: Judgement and insight appear normal. Mood & affect appropriate.    Data Reviewed: I have personally reviewed following labs and imaging studies  CBC: Recent Labs  Lab 06/14/18 1317 06/15/18 0348  WBC 6.1 8.6  NEUTROABS  --  7.6  HGB 16.0* 14.6  HCT 46.3* 43.4  MCV 92.2 92.3  PLT 231 198   Basic Metabolic Panel: Recent Labs  Lab 06/15/18 0348 06/16/18 1036 06/17/18 0405 06/18/18 0701 06/19/18 0628 06/20/18 0543  NA 138 136 137 138 141 143  K 3.0* 3.5 3.2* 3.1* 3.6 3.9  CL 108 107 108 108 113* 114*  CO2 23 23 22 24 23 26   GLUCOSE 130* 83 72 133* 134* 129*  BUN 15 14 14 9 10 9   CREATININE 0.85 0.70 0.57 0.54 0.56 0.58  CALCIUM 7.5* 7.4* 7.5* 7.7* 7.5* 7.9*  MG 1.5* 2.3 2.5* 2.0 2.0  --   PHOS 2.6  --  1.6*  --   --   --    GFR: Estimated Creatinine Clearance: 49.5 mL/min (by C-G formula based on SCr of 0.58 mg/dL).  Liver Function Tests: Recent Labs  Lab 06/14/18 1317 06/15/18 0348  AST 17 18  ALT 13 11  ALKPHOS 41 25*  BILITOT 0.9 0.7  PROT 7.7 5.3*  ALBUMIN 3.9 2.4*   Recent Labs  Lab 06/14/18 1317  LIPASE 28   Coagulation Profile: Recent Labs  Lab 06/14/18 2316  INR 1.32   Urine analysis:    Component Value Date/Time   COLORURINE YELLOW 06/14/2018 2348   APPEARANCEUR HAZY (A) 06/14/2018 2348   LABSPEC >1.046 (H) 06/14/2018 2348   PHURINE 5.0 06/14/2018 2348   GLUCOSEU NEGATIVE 06/14/2018 2348   HGBUR SMALL (A) 06/14/2018 2348   BILIRUBINUR NEGATIVE 06/14/2018 2348   KETONESUR NEGATIVE 06/14/2018 2348   PROTEINUR NEGATIVE 06/14/2018 2348   NITRITE NEGATIVE 06/14/2018 2348   LEUKOCYTESUR  NEGATIVE 06/14/2018 2348    Recent Results (from the past 240 hour(s))  MRSA PCR Screening     Status: None   Collection Time: 06/14/18 10:32 PM  Result Value Ref Range Status   MRSA by PCR NEGATIVE NEGATIVE Final    Comment:        The GeneXpert MRSA Assay (FDA approved for NASAL specimens only), is one component of a comprehensive MRSA colonization surveillance program. It is not intended to diagnose MRSA infection nor to guide or monitor treatment for MRSA infections. Performed at [facility], [address] [place], [place] [address]   Culture, blood (x 2)     Status: None   Collection Time: 06/14/18 11:16 PM  Result Value Ref Range Status   Specimen Description NECK RIGHT  Final   Special Requests   Final    BOTTLES DRAWN AEROBIC AND ANAEROBIC Blood Culture adequate volume   Culture   Final    NO  GROWTH 5 DAYS Performed at [facility], [address] [place], [place] [address]    Report Status 06/19/2018 FINAL  Final  Culture, blood (x 2)     Status: None   Collection Time: 06/14/18 11:16 PM  Result Value Ref Range Status   Specimen Description BLOOD RIGHT HAND  Final   Special Requests   Final    BOTTLES DRAWN AEROBIC AND ANAEROBIC Blood Culture adequate volume   Culture   Final    NO GROWTH 5 DAYS Performed at [facility], [address] [place], [place] [address]    Report Status 06/19/2018 FINAL  Final     Radiology Studies: No results found.  Scheduled Meds: . heparin injection (subcutaneous)  5,000 Units Subcutaneous Q8H  . latanoprost  1 drop Both Eyes QHS  . [START ON 06/21/2018] levothyroxine  100 mcg Oral QAC breakfast  . verapamil  240 mg Oral Q12H   Continuous Infusions: . dextrose 5 % and 0.9 % NaCl with KCl 20 mEq/L 100 mL/hr at 06/20/18 1242     LOS: 6 days    Time spent: 30 minutes  Vassie Lollarlos Sandara Tyree, MD [facility] Pager [phone]  If 7PM-7AM, please contact night-coverage [email or web address] Password [email or web address] 06/20/2018, 5:35 PM

## 2018-06-20 NOTE — Care Management Important Message (Signed)
Important Message  Patient Details  Name: Michelle RothmanDeloris H Wall MRN: [id number] Date of Birth: 1933/02/01   Medicare Important Message Given:  Yes    [name], [name] 06/20/2018, 11:56 AM

## 2018-06-21 ENCOUNTER — Inpatient Hospital Stay (HOSPITAL_COMMUNITY): Payer: Medicare Other

## 2018-06-21 LAB — CBC WITH DIFFERENTIAL/PLATELET
BASOS PCT: 1 %
Basophils Absolute: 0.1 10*3/uL (ref 0.0–0.1)
EOS PCT: 0 %
Eosinophils Absolute: 0 10*3/uL (ref 0.0–0.7)
HCT: 38.6 % (ref 36.0–46.0)
Hemoglobin: 12.9 g/dL (ref 12.0–15.0)
Lymphocytes Relative: 19 %
Lymphs Abs: 2.2 10*3/uL (ref 0.7–4.0)
MCH: 30.4 pg (ref 26.0–34.0)
MCHC: 33.4 g/dL (ref 30.0–36.0)
MCV: 90.8 fL (ref 78.0–100.0)
MONO ABS: 1.6 10*3/uL — AB (ref 0.1–1.0)
Monocytes Relative: 14 %
Neutro Abs: 7.6 10*3/uL (ref 1.7–7.7)
Neutrophils Relative %: 66 %
PLATELETS: 338 10*3/uL (ref 150–400)
RBC: 4.25 MIL/uL (ref 3.87–5.11)
RDW: 15.6 % — ABNORMAL HIGH (ref 11.5–15.5)
WBC Morphology: INCREASED
WBC: 11.5 10*3/uL — ABNORMAL HIGH (ref 4.0–10.5)

## 2018-06-21 NOTE — Care Management Note (Signed)
Case Management Note  Patient Details  Name: Michelle RothmanDeloris H Gade MRN: [id number] Date of Birth: May 12, 1933   If discussed at Long Length of Stay Meetings, dates discussed:  06/21/18  Additional Comments:  Malcolm Metrohildress, Rasheida Broden Demske, RN 06/21/2018, 2:51 PM

## 2018-06-21 NOTE — Progress Notes (Signed)
[facility] Progress Note  7 Days Post-Op  Subjective: Tolerating liquids, no complaints. No nausea/vomiting. Starting to make sure liquid and reported gas in bag overnight. Burped twice per the patient and daughter.   Objective: Vital signs in last 24 hours: Temp:  [98.4 F (36.9 C)-99 F (37.2 C)] 99 F (37.2 C) (07/30 0452) Pulse Rate:  [91-133] 101 (07/30 0452) Resp:  [16-19] 19 (07/30 0452) BP: (143-177)/(70-97) 143/74 (07/30 0452) SpO2:  [87 %-99 %] 92 % (07/30 0452)    Intake/Output from previous day: 07/29 0701 - 07/30 0700 In: 3240 [P.O.:840; I.V.:2400] Out: 950 [Urine:950] Intake/Output this shift: Total I/O In: 120 [P.O.:120] Out: 250 [Urine:250]  General appearance: alert, cooperative and no distress Resp: normal work breathing GI: soft, minimally distended, ostomy with some liquid in bag, midline with packing Extremities: extremities normal, atraumatic, no cyanosis or edema  Lab Results:  Recent Labs    06/21/18 0509  WBC 11.5*  HGB 12.9  HCT 38.6  PLT 338   BMET Recent Labs    06/19/18 0628 06/20/18 0543  NA 141 143  K 3.6 3.9  CL 113* 114*  CO2 23 26  GLUCOSE 134* 129*  BUN 10 9  CREATININE 0.56 0.58  CALCIUM 7.5* 7.9*   PT/INR No results for input(s): LABPROT, INR in the last 72 hours.  Studies/Results: No results found.  Anti-infectives: Anti-infectives (From admission, onward)   Start     Dose/Rate Route Frequency Ordered Stop   06/15/18 0100  piperacillin-tazobactam (ZOSYN) IVPB 3.375 g     3.375 g 12.5 mL/hr over 240 Minutes Intravenous Every 8 hours 06/14/18 2211 06/19/18 2200   06/15/18 0100  piperacillin-tazobactam (ZOSYN) IVPB 3.375 g  Status:  Discontinued     3.375 g 12.5 mL/hr over 240 Minutes Intravenous Every 8 hours 06/14/18 2204 06/14/18 2217   06/14/18 2145  cefoTEtan (CEFOTAN) 2 g in sodium chloride 0.9 % 100 mL IVPB  Status:  Discontinued     2 g 200 mL/hr over 30 Minutes Intravenous On call  to O.R. 06/14/18 2139 06/14/18 2211   06/14/18 1645  piperacillin-tazobactam (ZOSYN) IVPB 3.375 g     3.375 g 100 mL/hr over 30 Minutes Intravenous  Once 06/14/18 1639 06/14/18 1723      Assessment/Plan: Michelle Wall is a84 yoPOD 7s/p partial sigmoid colectomy with end colostomy for perforated sigmoid colonfrom diverticulitis. Starting to have output from the ostomy. -PRN pain -Ambulate, working with PT -Full liquids lunch and soft diet for PM if does well, RN can adv -WBC to 11.5, not shifted, no fevers, do not expect intraabdominal infection going on and ileus is resolving -SCDs, heparin sq -Home possibly tomorrow     LOS: 7 days    Michelle Wall 06/21/2018

## 2018-06-21 NOTE — Consult Note (Signed)
WOC Nurse ostomy follow up Stoma type/location: LUQ colostomy  Pink patent and producing soft brown stool.  Family is independent in ostomy care.  Daughter is at bedside and states she has not seen any additional supplies.  I will order these today.  Stomal assessment/size: 1 3/8"  Peristomal assessment:  Treatment options for stomal/peristomal skin: barrier ring Output soft brown stool Ostomy pouching: 2pc. 2 3/4" pouch with barrier ring  Education provided: Daughter informed that supplies would be ordered.  Patient and family deny further questions or concerns about ostomy care.  [place] in [place] [employer] [place] Discharge program: Normajean BaxterYes\ WOc team will remain available.  Maple HudsonKaren Dorene Bruni RN BSN CWON Pager [phone]

## 2018-06-21 NOTE — Progress Notes (Signed)
PROGRESS NOTE    Michelle Wall  ZOX:[id number] DOB: 22-Feb-1933 DOA: 06/14/2018 PCP: Mirna MiresHill, Gerald, MD    Brief Narrative:  84 y/o with PMH of HTN, hypothyroidism, tobacco abuse, glaucoma and tricuspid insufficiency; who presented to ED with abd pain. Found to have sepsis in the setting of perforated viscus. General surgery on board and patient is status post laparotomy and colostomy.  Assessment & Plan: 1-Sepsis [facility](HCC): in the setting of acute perforated viscus. -status post laparotomy with colostomy -completed treatment with zosyn. -Patient tolerating CLD; will advance to full liquid, and if ok soft diet for supper. -tolerating PO meds w/o problem. -follow general surgery rec's -continue repletion of electrolytes and follow trend; goal is to keep Mg > 2 and K > 4 -continue to increase physical activity   2-Tobacco abuse -cessation counseling provided -patient declined nicotine patch.  3-Essential hypertension -BP stable and HR well controlled -continue home verapamil   4-hypothyroidism -continue synthroid  5-hypokalemia and hypomagnesemia  -Electrolytes within normal limits now -Continue repletion as needed -Follow electrolytes trend.  6-glaucoma -Continue Xalatan QHS.  7-tachycardia  -will resume home verapamil -EKG demonstrated normal sinus rhythm. -continue electrolytes repletion as needed (especially K and Mg) -HR stable and well control -discontinue telemetry.  8-decrease in urine output -continue IVF's for now; will decrease rate. -patient reported good urine output now.  DVT prophylaxis: heparin  Code Status: Full Family Communication: daughters at bedside  Disposition Plan: Remains inpatient.  After discussing her care with general surgery, the plan is to advance diet to full liquid and if tolerated soft diet for evening time. Continue electrolytes repletion and physical activity with PT.  Consultants:   General surgery   Procedures:   S/p  laparotomy with colostomy 7/23  Antimicrobials:  Anti-infectives (From admission, onward)   Start     Dose/Rate Route Frequency Ordered Stop   06/15/18 0100  piperacillin-tazobactam (ZOSYN) IVPB 3.375 g     3.375 g 12.5 mL/hr over 240 Minutes Intravenous Every 8 hours 06/14/18 2211 06/19/18 2200   06/15/18 0100  piperacillin-tazobactam (ZOSYN) IVPB 3.375 g  Status:  Discontinued     3.375 g 12.5 mL/hr over 240 Minutes Intravenous Every 8 hours 06/14/18 2204 06/14/18 2217   06/14/18 2145  cefoTEtan (CEFOTAN) 2 g in sodium chloride 0.9 % 100 mL IVPB  Status:  Discontinued     2 g 200 mL/hr over 30 Minutes Intravenous On call to O.R. 06/14/18 2139 06/14/18 2211   06/14/18 1645  piperacillin-tazobactam (ZOSYN) IVPB 3.375 g     3.375 g 100 mL/hr over 30 Minutes Intravenous  Once 06/14/18 1639 06/14/18 1723      Subjective: No fever, no CP, no SOB and no palpitations. Patient reported 2 episodes of burping, passing gas into colostomy bag and noticed liquids feces.  Objective: Vitals:   06/20/18 2108 06/21/18 0452 06/21/18 1402 06/21/18 1958  BP: (!) 161/70 (!) 143/74 118/77   Pulse: 91 (!) 101 87   Resp:  19 19   Temp:  99 F (37.2 C) 98.9 F (37.2 C)   TempSrc:  Oral Oral   SpO2:  92% 97% 93%  Weight:      Height:        Intake/Output Summary (Last 24 hours) at 06/21/2018 2108 Last data filed at 06/21/2018 1700 Gross per 24 hour  Intake 2420 ml  Output 550 ml  Net 1870 ml   Filed Weights   06/16/18 0500 06/17/18 0453 06/17/18 1954  Weight: 72.3 kg (159 lb  6.3 oz) 70.3 kg (154 lb 15.7 oz) 69.4 kg (153 lb)    Examination: General exam: Alert, awake, oriented x 3; no nausea, no vomiting. Tolerating CLD and in no distress. Very little discomfort reported in her abd around wound. Patient noticed some gas and liquid feces in her colostomy bag. Respiratory system: Clear to auscultation. Respiratory effort normal. Cardiovascular system:RRR. No murmurs, rubs,  gallops. Gastrointestinal system: Abdomen is mildly distended, soft and with soreness around mid abdominal wound. Colostomy bag with some liquid feces; mid abdomen w/o signs of superimposed infection and with clean dressings in place. Positive BS on exam. Central nervous system: Alert and oriented. No focal neurological deficits. Extremities: No C/C/E, +pedal pulses Skin: No rashes, no petechiae. Psychiatry: Judgement and insight appear normal. Mood & affect appropriate.    Data Reviewed: I have personally reviewed following labs and imaging studies  CBC: Recent Labs  Lab 06/15/18 0348 06/21/18 0509  WBC 8.6 11.5*  NEUTROABS 7.6 7.6  HGB 14.6 12.9  HCT 43.4 38.6  MCV 92.3 90.8  PLT 198 338   Basic Metabolic Panel: Recent Labs  Lab 06/15/18 0348 06/16/18 1036 06/17/18 0405 06/18/18 0701 06/19/18 0628 06/20/18 0543  NA 138 136 137 138 141 143  K 3.0* 3.5 3.2* 3.1* 3.6 3.9  CL 108 107 108 108 113* 114*  CO2 23 23 22 24 23 26   GLUCOSE 130* 83 72 133* 134* 129*  BUN 15 14 14 9 10 9   CREATININE 0.85 0.70 0.57 0.54 0.56 0.58  CALCIUM 7.5* 7.4* 7.5* 7.7* 7.5* 7.9*  MG 1.5* 2.3 2.5* 2.0 2.0  --   PHOS 2.6  --  1.6*  --   --   --    GFR: Estimated Creatinine Clearance: 49.5 mL/min (by C-G formula based on SCr of 0.58 mg/dL).  Liver Function Tests: Recent Labs  Lab 06/15/18 0348  AST 18  ALT 11  ALKPHOS 25*  BILITOT 0.7  PROT 5.3*  ALBUMIN 2.4*   Coagulation Profile: Recent Labs  Lab 06/14/18 2316  INR 1.32   Urine analysis:    Component Value Date/Time   COLORURINE YELLOW 06/14/2018 2348   APPEARANCEUR HAZY (A) 06/14/2018 2348   LABSPEC >1.046 (H) 06/14/2018 2348   PHURINE 5.0 06/14/2018 2348   GLUCOSEU NEGATIVE 06/14/2018 2348   HGBUR SMALL (A) 06/14/2018 2348   BILIRUBINUR NEGATIVE 06/14/2018 2348   KETONESUR NEGATIVE 06/14/2018 2348   PROTEINUR NEGATIVE 06/14/2018 2348   NITRITE NEGATIVE 06/14/2018 2348   LEUKOCYTESUR NEGATIVE 06/14/2018 2348     Recent Results (from the past 240 hour(s))  MRSA PCR Screening     Status: None   Collection Time: 06/14/18 10:32 PM  Result Value Ref Range Status   MRSA by PCR NEGATIVE NEGATIVE Final    Comment:        The GeneXpert MRSA Assay (FDA approved for NASAL specimens only), is one component of a comprehensive MRSA colonization surveillance program. It is not intended to diagnose MRSA infection nor to guide or monitor treatment for MRSA infections. Performed at [facility], [address] [place], [place] [address]   Culture, blood (x 2)     Status: None   Collection Time: 06/14/18 11:16 PM  Result Value Ref Range Status   Specimen Description NECK RIGHT  Final   Special Requests   Final    BOTTLES DRAWN AEROBIC AND ANAEROBIC Blood Culture adequate volume   Culture   Final    NO GROWTH 5 DAYS Performed at [facility], [address] [place], [place] [address]    Report Status 06/19/2018 FINAL  Final  Culture, blood (x 2)     Status: None   Collection Time: 06/14/18 11:16 PM  Result Value Ref Range Status   Specimen Description BLOOD RIGHT HAND  Final   Special Requests   Final    BOTTLES DRAWN AEROBIC AND ANAEROBIC Blood Culture adequate volume   Culture   Final    NO GROWTH 5 DAYS Performed at [facility], [address] [place], [place] [address]    Report Status 06/19/2018 FINAL  Final     Radiology Studies: No results found.  Scheduled Meds: . heparin injection (subcutaneous)  5,000 Units Subcutaneous Q8H  . latanoprost  1 drop Both Eyes QHS  . levothyroxine  100 mcg Oral QAC breakfast  . verapamil  240 mg Oral Q12H   Continuous Infusions: . dextrose 5 % and 0.9 % NaCl with KCl 20 mEq/L 100 mL/hr at 06/21/18 0916     LOS: 7 days    Time spent: 30 minutes  Vassie Loll, MD [facility] Pager [phone]  If 7PM-7AM, please contact night-coverage [email or web address] Password [email or web address] 06/21/2018, 9:08 PM

## 2018-06-21 NOTE — Progress Notes (Signed)
Physical Therapy Treatment Patient Details Name: Michelle RothmanDeloris H Wall MRN: [id number] DOB: Jun 27, 1933 Today's Date: 06/21/2018    History of Present Illness Michelle Wall is a 84 y.o. 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  Today patient presented with some nausea and abdominal discomfort.  She has not had any prior issues before.  Of note patient recently received some pain medication so is intermittently sleepy so not much history from her.  She denies any chest pain shortness breath fever cough or diarrhea.  Patient was found to have free air on imaging done in the ED.  Dr. Henreitta LeberBridges general surgery evaluated patient and she was taken back to surgery stat.  She has a document history of tricuspid insufficiency pulmonary hypertension.  She has not followed up with cardiology in the recent years.  She has hypertension for which she takes meds and is compliant.  Postop patient was extubated  without any difficulty as far as her breathing is concerned per report from surgery.  Her blood pressure did drop some in surgery so art line was placed .  She did receive a few doses of vasopressin.  Lactic acid is elevated around 3.7.  Did receive Zosyn intraoperatively.      PT Comments    Patient demonstrates good return for completing BLE ROM/strengthening while seated at bedside, on room air but O2 saturation dropped to 88%, had to put back on 2 LPM for gait training and demonstrated increased endurance/distance without loss of balance.  Patient will benefit from continued physical therapy in hospital and recommended venue below to increase strength, balance, endurance for safe ADLs and gait.    Follow Up Recommendations  Home health PT;Supervision for mobility/OOB     Equipment Recommendations  Rolling walker with 5" wheels    Recommendations for Other  Services       Precautions / Restrictions Precautions Precautions: Fall Restrictions Weight Bearing Restrictions: No    Mobility  Bed Mobility Overal bed mobility: Needs Assistance Bed Mobility: Supine to Sit;Sit to Supine     Supine to sit: Min assist Sit to supine: Min assist   General bed mobility comments: slightly labored with VCs to prop onto elbows for supine to sit with fair/good carryover  Transfers Overall transfer level: Needs assistance Equipment used: Rolling walker (2 wheeled) Transfers: Sit to/from [employer] CorporationStand;Stand Pivot Transfers Sit to Stand: Min guard Stand pivot transfers: Min guard       General transfer comment: slow labored movement  Ambulation/Gait Ambulation/Gait assistance: Min guard Gait Distance (Feet): 55 Feet Assistive device: Rolling walker (2 wheeled) Gait Pattern/deviations: Decreased step length - right;Decreased step length - left;WFL(Within Functional Limits) Gait velocity: slow   General Gait Details: slightly labored cadence with increased endurance/distance while on 2 LPM O2, no loss of balance, limited secondary to fatigue and mild SOB   Stairs             Wheelchair Mobility    Modified Rankin (Stroke Patients Only)       Balance Overall balance assessment: Needs assistance Sitting-balance support: Feet supported;No upper extremity supported Sitting balance-Leahy Scale: Good     Standing balance support: During functional activity;Bilateral upper extremity supported Standing balance-Leahy Scale: Fair                              Cognition Arousal/Alertness: Awake/alert Behavior  During Therapy: WFL for tasks assessed/performed Overall Cognitive Status: Within Functional Limits for tasks assessed                                        Exercises General Exercises - Lower Extremity Long Arc Quad: Seated;AROM;Strengthening;Both;10 reps Toe Raises: Seated;AROM;Strengthening;Both;10  reps Heel Raises: Seated;AROM;Strengthening;Both;10 reps    General Comments        Pertinent Vitals/Pain Pain Assessment: No/denies pain    Home Living                      Prior Function            PT Goals (current goals can now be found in the care plan section) Acute Rehab PT Goals Patient Stated Goal: return home with family to assist PT Goal Formulation: With patient/family Time For Goal Achievement: 06/28/18 Potential to Achieve Goals: Good Progress towards PT goals: Progressing toward goals    Frequency    Min 3X/week      PT Plan Current plan remains appropriate    Co-evaluation              AM-PAC PT "6 Clicks" Daily Activity  Outcome Measure  Difficulty turning over in bed (including adjusting bedclothes, sheets and blankets)?: A Little Difficulty moving from lying on back to sitting on the side of the bed? : A Little Difficulty sitting down on and standing up from a chair with arms (e.g., wheelchair, bedside commode, etc,.)?: A Little Help needed moving to and from a bed to chair (including a wheelchair)?: A Little Help needed walking in hospital room?: A Little Help needed climbing 3-5 steps with a railing? : A Lot 6 Click Score: 17    End of Session Equipment Utilized During Treatment: Gait belt;Oxygen Activity Tolerance: Patient tolerated treatment well;Patient limited by fatigue Patient left: in bed;with call bell/phone within reach;with family/visitor present Nurse Communication: Mobility status PT Visit Diagnosis: Unsteadiness on feet (R26.81);Other abnormalities of gait and mobility (R26.89);Muscle weakness (generalized) (M62.81)     Time: 1610-9604 PT Time Calculation (min) (ACUTE ONLY): 32 min  Charges:  $Gait Training: 8-22 mins $Therapeutic Exercise: 8-22 mins                     4:02 PM, 06/21/18 Ocie Bob, MPT Physical Therapist with [facility] [phone] office [phone] mobile  phone

## 2018-06-22 DIAGNOSIS — A419 Sepsis, unspecified organism: Principal | ICD-10-CM

## 2018-06-22 DIAGNOSIS — Z23 Encounter for immunization: Secondary | ICD-10-CM | POA: Diagnosis not present

## 2018-06-22 MED ORDER — HYDROCODONE-ACETAMINOPHEN 5-325 MG PO TABS: 1.0000 | ORAL_TABLET | ORAL | 0 refills | Status: DC | PRN

## 2018-06-22 MED ORDER — DOCUSATE SODIUM 100 MG PO CAPS
100.0000 mg | ORAL_CAPSULE | Freq: Two times a day (BID) | ORAL | 0 refills | Status: DC
Start: 2018-06-22 — End: 2018-06-30

## 2018-06-22 MED ORDER — DOCUSATE SODIUM 100 MG PO CAPS: 100.0000 mg | ORAL_CAPSULE | Freq: Two times a day (BID) | ORAL | Status: DC

## 2018-06-22 MED ORDER — ACETAMINOPHEN 325 MG PO TABS
650.0000 mg | ORAL_TABLET | ORAL | Status: DC | PRN
  Administered 2018-06-22: 650 mg via ORAL
  Filled 2018-06-22: qty 2

## 2018-06-22 NOTE — Progress Notes (Signed)
Margot H Hyndman discharged Home per MD order.  Discharge instructions reviewed and discussed with the patient, all questions and concerns answered. Copy of instructions and scripts given to patient.  Allergies as of 06/22/2018   No Known Allergies     Medication List    TAKE these medications   aspirin EC 81 MG tablet Take 81 mg by mouth daily.   CALCIUM-VITAMIN D PO Take 1 tablet by mouth 2 (two) times daily.   docusate sodium 100 MG capsule Commonly known as:  COLACE Take 1 capsule (100 mg total) by mouth 2 (two) times daily.   hydrochlorothiazide 12.5 MG tablet Commonly known as:  HYDRODIURIL Take 12.5 mg by mouth daily.   HYDROcodone-acetaminophen 5-325 MG tablet Commonly known as:  NORCO/VICODIN Take 1 tablet by mouth every 4 (four) hours as needed for moderate pain.   latanoprost 0.005 % ophthalmic solution Commonly known as:  XALATAN Place 1 drop into both eyes at bedtime.   lisinopril 20 MG tablet Commonly known as:  PRINIVIL,ZESTRIL Take 20 mg by mouth 2 (two) times daily.   SYNTHROID 100 MCG tablet Generic drug:  levothyroxine TK 1 T PO Q MORNING.   verapamil 240 MG CR tablet Commonly known as:  CALAN-SR Take 240 mg by mouth 2 (two) times daily.            Durable Medical Equipment  (From admission, onward)        Start     Ordered   06/22/18 1211  DME Oxygen  Once    Question Answer Comment  Mode or (Route) Nasal cannula   Liters per Minute 2   Frequency Continuous (stationary and portable oxygen unit needed)   Oxygen conserving device Yes   Oxygen delivery system Gas      06/22/18 1210   06/22/18 1141  DME Oxygen  Once    Question Answer Comment  Mode or (Route) Nasal cannula   Frequency Continuous (stationary and portable oxygen unit needed)   Oxygen conserving device Yes   Oxygen delivery system Gas      06/22/18 1141   06/22/18 1135  For home use only DME Walker rolling  Once    Question:  Patient needs a walker to treat with the  following condition  Answer:  General weakness   06/22/18 1134   06/20/18 1158  For home use only DME Walker rolling  Once    Question:  Patient needs a walker to treat with the following condition  Answer:  General weakness   06/20/18 1157       IV site discontinued and catheter remains intact. Site without signs and symptoms of complications. Dressing and pressure applied.  Patient escorted to car by NT in a wheelchair,  no distress noted upon discharge.  Rica KoyanagiBonnie M Nour Scalise 06/22/2018 1:49 PM

## 2018-06-22 NOTE — Progress Notes (Addendum)
SATURATION QUALIFICATIONS: (This note is used to comply with regulatory documentation for home oxygen)  Patient Saturations on Room Air at Rest = 87%  Patient Saturations on 2L of oxygen at rest=95%  Please briefly explain why patient needs home oxygen: pt desats on room air while at rest.

## 2018-06-22 NOTE — Care Management Note (Signed)
Case Management Note  Patient Details  Name: Sabreen H Bobak MRN: [id number] Date of Birth: May 31, 1933  Expected Discharge Date:  06/22/18              Sharrie Rothman Expected Discharge Plan:  Home w Home Health Services  In-House Referral:     Discharge planning Services  CM Consult  Post Acute Care Choice:  Home Health, Durable Medical Equipment Choice offered to:  Patient, Adult Children  DME Arranged:  Oxygen DME Agency:  [facility]  HH Arr[facility]  [facility]  [facility], [facility]  Status of Service:  In process, will continue to follow  If discussed at Long Length of Stay Meetings, dates discussed:    Additional Comments: [facility]. Pt and dtr aware HH has 48 hrs to make first visit. Pt needs supplemental oxygen, has chosen [facility] from list of provider options. [facility] rep aware of DC and given referral and will deliver port device to pt room prior to DC.   Malcolm Metrohildress, Safal Halderman Demske, RN 06/22/2018, 12:37 PM

## 2018-06-22 NOTE — Progress Notes (Signed)
[facility] Progress Note  8 Days Post-Op  Subjective: Looks good no complaints. Having ostomy function.   Objective: Vital signs in last 24 hours: Temp:  [98.4 F (36.9 C)-99 F (37.2 C)] 98.4 F (36.9 C) (07/31 0640) Pulse Rate:  [87-99] 99 (07/31 0640) Resp:  [19] 19 (07/30 1402) BP: (118-147)/(71-77) 147/76 (07/31 0640) SpO2:  [87 %-97 %] 95 % (07/31 1100)    Intake/Output from previous day: 07/30 0701 - 07/31 0700 In: 1420 [P.O.:120; I.V.:1300] Out: 900 [Urine:900] Intake/Output this shift: Total I/O In: 240 [P.O.:240] Out: -   General appearance: alert, cooperative and no distress Resp: normal work breathing GI: soft, appropriately tender, wound with packing, no erythema or drainage, ostomy with gas and liquid in bag  Lab Results:  Recent Labs    06/21/18 0509  WBC 11.5*  HGB 12.9  HCT 38.6  PLT 338   BMET Recent Labs    06/20/18 0543  NA 143  K 3.9  CL 114*  CO2 26  GLUCOSE 129*  BUN 9  CREATININE 0.58  CALCIUM 7.9*    Anti-infectives: Anti-infectives (From admission, onward)   Start     Dose/Rate Route Frequency Ordered Stop   06/15/18 0100  piperacillin-tazobactam (ZOSYN) IVPB 3.375 g     3.375 g 12.5 mL/hr over 240 Minutes Intravenous Every 8 hours 06/14/18 2211 06/19/18 2200   06/15/18 0100  piperacillin-tazobactam (ZOSYN) IVPB 3.375 g  Status:  Discontinued     3.375 g 12.5 mL/hr over 240 Minutes Intravenous Every 8 hours 06/14/18 2204 06/14/18 2217   06/14/18 2145  cefoTEtan (CEFOTAN) 2 g in sodium chloride 0.9 % 100 mL IVPB  Status:  Discontinued     2 g 200 mL/hr over 30 Minutes Intravenous On call to O.R. 06/14/18 2139 06/14/18 2211   06/14/18 1645  piperacillin-tazobactam (ZOSYN) IVPB 3.375 g     3.375 g 100 mL/hr over 30 Minutes Intravenous  Once 06/14/18 1639 06/14/18 1723      Assessment/Plan: Michelle Wall [id number] [id number]/p partial sigmoid colectomy with end colostomy for perforated sigmoid colonfrom  diverticulitis. Looks good -PRN for pain -Soft diet as tolerated -Can d/c home from surgical perspective    LOS: 8 days    Lucretia RoersLindsay C Bridges 06/22/2018

## 2018-06-22 NOTE — Discharge Instructions (Signed)
Twice daily packing of the midline wound with damp to dry gauze. Colostomy care as instructed by the colostomy RN.  Can shower, cover ostomy bag with plastic and keep from getting wet. Change abdominal dressing after showering.  No heavy lifting > 10 lbs, excessive bending, squatting, pushing/ pulling for the next 8 weeks after surgery.  Colace daily to Bid to keep stools regular and soft.  Colostomy, Adult, Care After Refer to this sheet in the next few weeks. These instructions provide you with information about caring for yourself after your procedure. Your health care provider may also give you more specific instructions. Your treatment has been planned according to current medical practices, but problems sometimes occur. Call your health care provider if you have any problems or questions after your procedure. What can I expect after the procedure? After the procedure, it is common to have:  Swelling at the opening that was created during the procedure (stoma).  Slight bleeding around the stoma.  Redness around the stoma.  Follow these instructions at home: Activity  Rest as needed while the stoma area heals.  Return to your normal activities as told by your health care provider. Ask your health care provider what activities are safe for you.  Avoid strenuous activity and abdominal exercises for 8 weeks or for as long as told by your health care provider.  Do not lift anything that is heavier than 10 lb (4.5 kg) for 8 weeks after surgery.  Incision care   Follow instructions from your health care provider about how to take care of your incision. Make sure you: ? Wash your hands with soap and water before you change your bandage (dressing). If soap and water are not available, use hand sanitizer. ? Change your dressing as told by your health care provider. ? Leave stitches (sutures), skin glue, or adhesive strips in place. These skin closures may need to stay in place for 2 weeks  or longer. If adhesive strip edges start to loosen and curl up, you may trim the loose edges. Do not remove adhesive strips completely unless your health care provider tells you to do that. Stoma Care  Keep the stoma area clean.  Clean and dry the skin around the stoma each time you change the colostomy bag. To clean the stoma area: ? Use warm water and only use cleansers that are recommended by your health care provider. ? Rinse the stoma area with plain water. ? Dry the area well.  Use stoma powder or ointment on your skin only as told by your health care provider. Do not use any other powders, gels, wipes, or creams on your skin.  Check the stoma area every day for signs of infection. Check for: ? More redness, swelling, or pain. ? More fluid or blood. ? Pus or warmth.  Measure the stoma opening regularly and record the size. Watch for changes. Share this information with your health care provider. Bathing  Do not take baths, swim, or use a hot tub until your health care provider approves. Ask your health care provider if you can take showers. You may be able to shower with or without the colostomy bag in place. If you bathe with the bag on, dry the bag afterward.  Avoid using harsh or oily soaps when you bathe. Colostomy Bag Care  Follow instructions from your health care provider about how to empty or change the colostomy bag.  Keep colostomy supplies with you at all times.  Store all  supplies in a cool, dry place.  Empty the colostomy bag: ? Whenever it is one-third to one-half full. ? At bedtime.  Replace the bag every 2-4 days or as told by your health care provider. Driving  Do not drive for 24 hours if you received a sedative.  Do not drive or operate heavy machinery while taking prescription pain medicine. General instructions  Follow instructions from your health care provider about eating or drinking restrictions.  Take over-the-counter and prescription  medicines only as told by your health care provider.  Avoid wearing clothes that are tight directly over your stoma.  Do not use any tobacco products, such as cigarettes, chewing tobacco, and e-cigarettes. If you need help quitting, ask your health care provider.  (Women) Ask your health care provider about becoming pregnant and about using birth control. Medicines may not be absorbed normally after the procedure.  Keep all follow-up visits as told by your health care provider. This is important. Contact a health care provider if:  You are having trouble caring for your stoma or changing the colostomy bag.  You feel nauseous or you vomit.  You have a fever.  You havemore redness, swelling, or pain at the site of your stoma or around your anus.  You have more fluid or blood coming from your stoma or your anus.  Your stoma area feels warm to the touch.  You have pus coming from your stoma.  You notice a change in the size or appearance of the stoma.  You have abdominal pain, bloating, pressure, or cramping.  Your have stool more often or less often than your health care provider tells you to expect.  You are not making much urine. This may be a sign of dehydration. Get help right away if:  Your abdominal pain does not go away or it becomes severe.  You keep vomiting.  Your stool is not draining through the stoma.  You have chest pain or an irregular heartbeat. This information is not intended to replace advice given to you by your health care provider. Make sure you discuss any questions you have with your health care provider. Document Released: 04/01/2011 Document Revised: 03/19/2016 Document Reviewed: 07/23/2015 Elsevier Interactive Patient Education  2018 [employer].   Colostomy Home Guide, Adult A colostomy is a surgical procedure to make an opening (stoma) for stool (feces) to leave your body. This surgery is done when a medical condition prevents stool from  leaving your body through the end of the large intestine (rectum). During the surgery, part of the large intestine (colon) is attached to the stoma that is made in the front of your abdomen. A bag (pouch) is fitted over the stoma. Stool and gas will collect in the bag. After having this surgery, you will need to empty and change your colostomy bag as needed. You will also need to care for the stoma. How do I care for my stoma? Your stoma should look pink, red, and moist, like the inside of your cheek. At first, the stoma may be swollen, but this swelling will go away within 6 weeks. To care for the stoma:  Keep the skin around the stoma clean and dry.  Use a clean, soft washcloth to gently wash the stoma and the skin around it. ? Use warm water and only use cleansers recommended by your health care provider. ? Rinse the stoma area with plain water. ? Dry the area well.  Use stoma powder or ointment on your  skin only as told by your health care provider. Do not use any other powders, gels, wipes, or creams on your skin.  Change your colostomy bag if your skin becomes irritated. Irritation may indicate that the bag is leaking.  Check your stoma area every day for signs of infection. Check for: ? More redness, swelling, or pain. ? More fluid or blood. ? Pus or warmth.  Measure the stoma opening regularly and record the size. Watch for changes. Share this information with your health care provider.  How do I care for my colostomy bag? The bag that fits over the stoma can have either one or two pieces.  One-piece bag: The skin barrier and the bag are combined in a single unit.  Two-piece bag: The skin barrier and the bag are separate pieces that attach to each other.  Empty your bag at bedtime and whenever it is one-third to one-half full. Do not let more stool or gas build up. This could cause the bag to leak. Some colostomy bags have a built-in gas release valve. Change the bag every 3-4  days or as told by your health care provider. Also change the bag if it is leaking or separating from the skin or your skin looks irritated. How do I empty my colostomy bag? Before you leave the hospital, you will be taught how to empty your bag. Follow these basic steps: 1. Wash your hands with soap and water. 2. Sit far back on the toilet. 3. Put several pieces of toilet paper into the toilet water. This will prevent splashing as you empty the stool into the toilet. 4. Remove the clip or the velcro from the tail end of the bag. 5. Unroll the tail, then empty stool into the toilet. 6. Clean the tail with toilet paper. 7. Reroll the tail, and close it with the clip or velcro. 8. Wash your hands again.  How do I change my colostomy bag? Before you leave the hospital, you will be taught how to change your bag. Always have colostomy supplies with you, and follow these basic steps: 1. Wash your hands with soap and water. Have paper towels or tissues near you to clean any discharge. 2. Use a template to pre-cut the skin barrier. Smooth any rough edges. 3. If using a two-piece bag, attach the bag and the skin barrier to each other. Add the barrier ring, if you use one. 4. If your stools are watery, add a few cotton balls to the new bag to absorb the liquid. 5. Remove the old bag and skin barrier. Gently push the skin away from the barrier with your fingers or a warm cloth. 6. Wash your hands again. Then clean the stoma area as directed with water or with mild soap and water. Use water to rinse away any soap. 7. Dry the skin. You may use the cool setting on a hair dryer to do this. 8. If directed, apply stoma powder or skin barrier gel to the skin. 9. Dry the skin again. 10. Warm the skin barrier with your hands or a warm compress. 11. Remove the paper from the sticky (adhesive) strip of the skin barrier. 12. Press the adhesive strip onto the skin around the stoma. 13. Gently rub the skin barrier  onto the skin. This creates heat that helps the barrier to stick. 14. Apply stoma tape to the edges of the skin barrier.  What are some general tips?  Avoid wearing clothes that are tight directly  over your stoma.  You may shower or bathe with the colostomy bag on or off. Do not use harsh or oily soaps or lotions. Dry the skin and bag after bathing.  Store all supplies in a cool, dry place. Do not leave supplies in extreme heat because parts can melt.  Whenever you leave home, take an extra skin barrier and colostomy bag with you.  If your colostomy bag gets wet, you can dry it with a hair dryer on the cool setting.  To prevent odor, put drops of ostomy deodorizer in the colostomy bag. Your health care provider may also recommend putting ostomy lubricant inside the bag. This helps the stool to slide out of the bag more easily and completely. Contact a health care provider if:  You have more redness, swelling, or pain around your stoma.  You have more fluid or blood coming from your stoma.  Your stoma feels warm to the touch.  You have pus coming from your stoma.  Your stoma extends in or out farther than normal.  You need to change the bag every day.  You have a fever. Get help right away if:  Your stool is bloody.  You vomit.  You have trouble breathing. This information is not intended to replace advice given to you by your health care provider. Make sure you discuss any questions you have with your health care provider. Document Released: 11/12/2003 Document Revised: 03/19/2016 Document Reviewed: 03/14/2014 Elsevier Interactive Patient Education  [employer].

## 2018-06-22 NOTE — Progress Notes (Signed)
Physical Therapy Treatment Patient Details Name: Sharrie RothmanDeloris H Addair MRN: [id number] DOB: January 27, 1933 Today's Date: 06/22/2018    History of Present Illness Mikayah H Laural BenesJohnson is a 84 y.o. 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  Today patient presented with some nausea and abdominal discomfort.  She has not had any prior issues before.  Of note patient recently received some pain medication so is intermittently sleepy so not much history from her.  She denies any chest pain shortness breath fever cough or diarrhea.  Patient was found to have free air on imaging done in the ED.  Dr. Henreitta [facility] surgery evaluated patient and she was taken back to surgery stat.  She has a document history of tricuspid insufficiency pulmonary hypertension.  She has not followed up with cardiology in the recent years.  She has hypertension for which she takes meds and is compliant.  Postop patient was extubated  without any difficulty as far as her breathing is concerned per report from surgery.  Her blood pressure did drop some in surgery so art line was placed .  She did receive a few doses of vasopressin.  Lactic acid is elevated around 3.7.  Did receive Zosyn intraoperatively.      PT Comments    Pt supine in bed with daughter present in room and willing to participate with therapist this morning.  Used 2L O2 A with nasal canal and O2 sat% from 86% initially following transition from supine to sitting.  Diaphragmatic breathing instructed with increased in saturation to 96% prior gait  Pt with min guard gait training use of RW for safety, pt with slow labored movements.  O2 saturation stayed WNL during gait and following.  No reports of pain or SOB through session.  EOS pt left in chair with call bell within reach and daughter in room.      Follow Up Recommendations  Home  health PT;Supervision for mobility/OOB     Equipment Recommendations  Rolling walker with 5" wheels    Recommendations for Other Services       Precautions / Restrictions Precautions Precautions: Fall Restrictions Weight Bearing Restrictions: No    Mobility  Bed Mobility Overal bed mobility: Modified Independent Bed Mobility: Supine to Sit     Supine to sit: Supervision(increased time)     General bed mobility comments: slightly labored with VCs to prop onto elbows for supine to sit with fair/good carryover  Transfers Overall transfer level: Modified independent Equipment used: Rolling walker (2 wheeled) Transfers: Sit to/from Stand Sit to Stand: Min guard         General transfer comment: slow labored movement  Ambulation/Gait Ambulation/Gait assistance: Min guard Gait Distance (Feet): 63 Feet Assistive device: Rolling walker (2 wheeled) Gait Pattern/deviations: Decreased step length - right;Decreased step length - left;WFL(Within Functional Limits) Gait velocity: slow   General Gait Details: slightly labored cadence with increased endurance/distance while on 2 LPM O2, no loss of balance, limited secondary to fatigue and mild SOB   Stairs             Wheelchair Mobility    Modified Rankin (Stroke Patients Only)       Balance  Cognition Arousal/Alertness: Awake/alert Behavior During Therapy: WFL for tasks assessed/performed Overall Cognitive Status: Within Functional Limits for tasks assessed                                        Exercises      General Comments        Pertinent Vitals/Pain Pain Assessment: No/denies pain    Home Living                      Prior Function            PT Goals (current goals can now be found in the care plan section)      Frequency    Min 3X/week      PT Plan Current plan remains appropriate     Co-evaluation              AM-PAC PT "6 Clicks" Daily Activity  Outcome Measure  Difficulty turning over in bed (including adjusting bedclothes, sheets and blankets)?: A Little Difficulty moving from lying on back to sitting on the side of the bed? : A Little Difficulty sitting down on and standing up from a chair with arms (e.g., wheelchair, bedside commode, etc,.)?: A Little Help needed moving to and from a bed to chair (including a wheelchair)?: A Little Help needed walking in hospital room?: A Little Help needed climbing 3-5 steps with a railing? : A Lot 6 Click Score: 17    End of Session Equipment Utilized During Treatment: Gait belt;Oxygen Activity Tolerance: Patient tolerated treatment well;Patient limited by fatigue Patient left: in chair;with call bell/phone within reach;with family/visitor present Nurse Communication: Mobility status PT Visit Diagnosis: Unsteadiness on feet (R26.81);Other abnormalities of gait and mobility (R26.89);Muscle weakness (generalized) (M62.81)     Time: 8119-1478 PT Time Calculation (min) (ACUTE ONLY): 18 min  Charges:  $Therapeutic Exercise: 8-22 mins                     [address], LPTA; CBIS [phone]  Juel Burrow 06/22/2018, 12:55 PM

## 2018-06-22 NOTE — Discharge Summary (Addendum)
Physician Discharge Summary  Michelle Wall WUJ:[id number] DOB: January 09, 1933 DOA: 06/14/2018  PCP: Mirna Mires, MD  Admit date: 06/14/2018  Discharge date: 06/22/2018  Admitted From:Home  Disposition:  Home  Recommendations for Outpatient Follow-up:  1. Follow up with PCP in 1-2 weeks 2. Follow up with General Surgery Dr. Henreitta Leber as scheduled on 06/30/2018  Home Health:With PT and RN  Equipment/Devices:Rolling walker and Home oxygen  Discharge Condition:Stable  CODE STATUS: Full  Diet recommendation: Heart Healthy  Brief/Interim Summary:  84 y/o with PMH of HTN, hypothyroidism, tobacco abuse, glaucoma and tricuspid insufficiency; who presented to ED with abd pain. Found to have sepsis in the setting of perforated viscus. General surgery on board and patient is status post partial sigmoid colectomy with end colostomy and is now postop day 8.  She is tolerating soft diet and otherwise looks well with good ostomy function noted.  Dr. Henreitta Leber of general surgery plans to see patient in follow-up in 1 week.   Discharge Diagnoses:  Principal Problem:   Sepsis (HCC) Active Problems:   Tobacco abuse   Essential hypertension   Pulmonary HTN (HCC)   Moderate tricuspid insufficiency   Perforated sigmoid colon (HCC)   Hypothyroid  1. Sepsis secondary to acute perforated viscus status post laparotomy with colostomy.  Patient has completed course of treatment of Zosyn and is tolerating soft diet with good ostomy function noted.  General surgery has seen and evaluated patient this morning with recommendations to discharge home with close follow-up in 1 week. 2. Pulmonary hypertension with hypoxemia.  Patient will require home oxygen which has been arranged at this time. Home O2 of 2L/min. 3. Tobacco abuse.  Cessation counseling provided and patient has declined nicotine patch. 4. Essential hypertension.  Continue home medications as previously.  Good control has been noted during  admission. 5. Hypothyroidism.  Continue Synthroid. 6. Glaucoma.  Continue eye medications as previously.  Discharge Instructions  Discharge Instructions    Call MD for:  persistant nausea and vomiting   Complete by:  As directed    Call MD for:  redness, tenderness, or signs of infection (pain, swelling, redness, odor or green/yellow discharge around incision site)   Complete by:  As directed    Call MD for:  severe uncontrolled pain   Complete by:  As directed    Diet - low sodium heart healthy   Complete by:  As directed    Increase activity slowly   Complete by:  As directed      Allergies as of 06/22/2018   No Known Allergies     Medication List    TAKE these medications   aspirin EC 81 MG tablet Take 81 mg by mouth daily.   CALCIUM-VITAMIN D PO Take 1 tablet by mouth 2 (two) times daily.   docusate sodium 100 MG capsule Commonly known as:  COLACE Take 1 capsule (100 mg total) by mouth 2 (two) times daily.   hydrochlorothiazide 12.5 MG tablet Commonly known as:  HYDRODIURIL Take 12.5 mg by mouth daily.   HYDROcodone-acetaminophen 5-325 MG tablet Commonly known as:  NORCO/VICODIN Take 1 tablet by mouth every 4 (four) hours as needed for moderate pain.   latanoprost 0.005 % ophthalmic solution Commonly known as:  XALATAN Place 1 drop into both eyes at bedtime.   lisinopril 20 MG tablet Commonly known as:  PRINIVIL,ZESTRIL Take 20 mg by mouth 2 (two) times daily.   SYNTHROID 100 MCG tablet Generic drug:  levothyroxine TK 1 T PO Q  MORNING.   verapamil 240 MG CR tablet Commonly known as:  CALAN-SR Take 240 mg by mouth 2 (two) times daily.            Durable Medical Equipment  (From admission, onward)        Start     Ordered   06/22/18 1141  DME Oxygen  Once    Question Answer Comment  Mode or (Route) Nasal cannula   Frequency Continuous (stationary and portable oxygen unit needed)   Oxygen conserving device Yes   Oxygen delivery system Gas       06/22/18 1141   06/22/18 1135  For home use only DME Walker rolling  Once    Question:  Patient needs a walker to treat with the following condition  Answer:  General weakness   06/22/18 1134   06/20/18 1158  For home use only DME Walker rolling  Once    Question:  Patient needs a walker to treat with the following condition  Answer:  General weakness   06/20/18 1157     Follow-up Information    Lucretia RoersBridges, Lindsay C, MD Follow up on 06/30/2018.   Specialty:  General Surgery Contact information: [address] [place] [facility] [address] [phone]        Mirna MiresHill, Gerald, MD Follow up in 2 week(s).   Specialty:  Family Medicine Contact information: [address] [place] [address] [phone]          No Known Allergies  Consultations: General Surgery Dr. Henreitta LeberBridges  Procedures/Studies: Dg Abd 1 View  Result Date: 06/18/2018 CLINICAL DATA:  Follow-up ileus EXAM: ABDOMEN - 1 VIEW COMPARISON:  06/14/2018 FINDINGS: Nasogastric catheter is noted within the stomach. Scattered large and small bowel gas is noted. No obstructive changes are seen. No free air is noted. Degenerative changes of lumbar spine are noted. IMPRESSION: No acute abnormality noted. Electronically Signed   By: Alcide CleverMark  Lukens M.D.   On: 06/18/2018 08:30   Ct Abdomen Pelvis W Contrast  Result Date: 06/14/2018 CLINICAL DATA:  84 year old with acute abdominal pain, generalized. EXAM: CT ABDOMEN AND PELVIS WITH CONTRAST TECHNIQUE: Multidetector CT imaging of the abdomen and pelvis was performed using the standard protocol following bolus administration of intravenous contrast. CONTRAST:  100mL ISOVUE-300 IOPAMIDOL (ISOVUE-300) INJECTION 61% COMPARISON:  None. FINDINGS: Lower chest: Filling defects in the right lower lobe airways that could represent aspiration or mucous plugging. There is atelectasis in both lower lobes. There appears to be volume loss in the right middle lobe. Coronary artery calcifications.  Hepatobiliary: Perihepatic ascites. There is free air within the perihepatic ascites. Gallbladder is unremarkable. Poorly defined low-density structure in the right hepatic lobe measures 1.1 cm and nonspecific. Portal venous system is patent. Pancreas: Unremarkable. No pancreatic ductal dilatation or surrounding inflammatory changes. Spleen: Normal in size without focal abnormality. Adrenals/Urinary Tract: 2.5 cm nodule in the right adrenal gland is indeterminate. Large cyst involving the right kidney upper pole measures up to 5 cm. Scattered hypodensities in both kidneys probably represent additional renal cysts. Some of these hypodensities are indeterminate and too small to definitively characterize. No hydronephrosis. Urinary bladder is unremarkable. Stomach/Bowel: Evidence for extraluminal gas in the right upper abdomen adjacent to the liver. In addition, there appears to be extraluminal gas and extraluminal stool in the right lower abdomen and pelvic region. Findings are suggestive for a perforation involving the sigmoid colon. This area of perforation is probably on sequence 2, image 61. The hepatic flexure is anterior to  the liver. There appears to be a normal appendix in the right upper abdomen. Free fluid in the central mesentery on sequence 2 image 53. Mesenteric edema and a small amount of mesenteric fluid. 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 Atherosclerotic disease involving the aorta and visceral arteries without aortic aneurysm. The origin of the left renal artery is heavily calcified. No lymph node enlargement in the abdomen or pelvis. Reproductive: Small uterus with myometrial calcifications. No evidence for an adnexal mass but limited evaluation due to the bowel pathology in this area. Other: Small amount of free fluid in the abdomen. Musculoskeletal: Multilevel degenerative disease in lumbar spine with  mild scoliosis. IMPRESSION: Perforated viscus. Evidence for a bowel perforation involving the sigmoid colon. There is free air and extraluminal stool associated with this perforated bowel. Small amount of ascites in the abdomen. Volume loss and mucous plugging in the right lower lobe. Indeterminate 2.5 cm right adrenal nodule. This may be an incidental finding unless the patient has a history of malignancy. A dedicated adrenal CT could be performed after patient's acute medical problems have been dealt with. 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 These results were called by telephone at the time of interpretation on 06/14/2018 at 4:33 pm to Dr. Particia Nearing , who verbally acknowledged these results. Electronically Signed   By: Richarda Overlie M.D.   On: 06/14/2018 16:44   Dg Chest Port 1 View  Result Date: 06/14/2018 CLINICAL DATA:  Central line placement EXAM: PORTABLE CHEST 1 VIEW COMPARISON:  06/14/2018 FINDINGS: Cardiac shadow is within normal limits. New right jugular central line is noted extending just below the cavoatrial junction. No pneumothorax is seen. Calcified granuloma in the right apex is noted. No focal infiltrate or sizable effusion is seen. IMPRESSION: No acute abnormality noted following central line placement. Electronically Signed   By: Alcide Clever M.D.   On: 06/14/2018 20:17   Dg Chest Port 1 View  Result Date: 06/14/2018 CLINICAL DATA:  Preop testing. EXAM: PORTABLE CHEST 1 VIEW COMPARISON:  Radiographs of May 19, 2014. FINDINGS: Stable cardiomediastinal silhouette. Atherosclerosis of thoracic aorta is noted. No pneumothorax or pleural effusion is noted. Hypoinflation of the lungs is noted with mild bibasilar subsegmental atelectasis. Bony thorax is unremarkable. IMPRESSION: Hypoinflation of the lungs with mild bibasilar subsegmental atelectasis. Aortic Atherosclerosis (ICD10-I70.0). Electronically Signed   By: Lupita Raider, M.D.   On: 06/14/2018 18:25   Discharge Exam: Vitals:    06/22/18 1050 06/22/18 1100  BP:    Pulse:    Resp:    Temp:    SpO2: (!) 87% 95%   Vitals:   06/21/18 2119 06/22/18 0640 06/22/18 1050 06/22/18 1100  BP: 136/71 (!) 147/76    Pulse: 98 99    Resp:      Temp: 99 F (37.2 C) 98.4 F (36.9 C)    TempSrc: Oral Oral    SpO2: 94% 92% (!) 87% 95%  Weight:      Height:        General: Pt is alert, awake, not in acute distress Cardiovascular: RRR, S1/S2 +, no rubs, no gallops Respiratory: CTA bilaterally, no wheezing, no rhonchi; on 2L [place] Abdominal: Soft, NT, ND, bowel sounds + dressings C/D/I; ostomy noted with no leaks Extremities: no edema, no cyanosis    The results of significant diagnostics from this hospitalization (including imaging, microbiology, ancillary and laboratory) are listed below for reference.  Microbiology: Recent Results (from the past 240 hour(s))  MRSA PCR Screening     Status: None   Collection Time: 06/14/18 10:32 PM  Result Value Ref Range Status   MRSA by PCR NEGATIVE NEGATIVE Final    Comment:        The GeneXpert MRSA Assay (FDA approved for NASAL specimens only), is one component of a comprehensive MRSA colonization surveillance program. It is not intended to diagnose MRSA infection nor to guide or monitor treatment for MRSA infections. Performed at [facility], [address], [place], [place] [address]   Culture, blood (x 2)     Status: None   Collection Time: 06/14/18 11:16 PM  Result Value Ref Range Status   Specimen Description NECK RIGHT  Final   Special Requests   Final    BOTTLES DRAWN AEROBIC AND ANAEROBIC Blood Culture adequate volume   Culture   Final    NO GROWTH 5 DAYS Performed at [facility], [address], [place], [place] [address]    Report Status 06/19/2018 FINAL  Final  Culture, blood (x 2)     Status: None   Collection Time: 06/14/18 11:16 PM  Result Value Ref Range Status   Specimen Description BLOOD RIGHT HAND  Final   Special Requests   Final     BOTTLES DRAWN AEROBIC AND ANAEROBIC Blood Culture adequate volume   Culture   Final    NO GROWTH 5 DAYS Performed at [facility], [address] [place], [place] [address]    Report Status 06/19/2018 FINAL  Final     Labs: BNP (last 3 results) No results for input(s): BNP in the last 8760 hours. Basic Metabolic Panel: Recent Labs  Lab 06/16/18 1036 06/17/18 0405 06/18/18 0701 06/19/18 0628 06/20/18 0543  NA 136 137 138 141 143  K 3.5 3.2* 3.1* 3.6 3.9  CL 107 108 108 113* 114*  CO2 23 22 24 23 26   GLUCOSE 83 72 133* 134* 129*  BUN 14 14 9 10 9   CREATININE 0.70 0.57 0.54 0.56 0.58  CALCIUM 7.4* 7.5* 7.7* 7.5* 7.9*  MG 2.3 2.5* 2.0 2.0  --   PHOS  --  1.6*  --   --   --    Liver Function Tests: No results for input(s): AST, ALT, ALKPHOS, BILITOT, PROT, ALBUMIN in the last 168 hours. No results for input(s): LIPASE, AMYLASE in the last 168 hours. No results for input(s): AMMONIA in the last 168 hours. CBC: Recent Labs  Lab 06/21/18 0509  WBC 11.5*  NEUTROABS 7.6  HGB 12.9  HCT 38.6  MCV 90.8  PLT 338   Cardiac Enzymes: No results for input(s): CKTOTAL, CKMB, CKMBINDEX, TROPONINI in the last 168 hours. BNP: Invalid input(s): POCBNP CBG: No results for input(s): GLUCAP in the last 168 hours. D-Dimer No results for input(s): DDIMER in the last 72 hours. Hgb A1c No results for input(s): HGBA1C in the last 72 hours. Lipid Profile No results for input(s): CHOL, HDL, LDLCALC, TRIG, CHOLHDL, LDLDIRECT in the last 72 hours. Thyroid function studies No results for input(s): TSH, T4TOTAL, T3FREE, THYROIDAB in the last 72 hours.  Invalid input(s): FREET3 Anemia work up No results for input(s): VITAMINB12, FOLATE, FERRITIN, TIBC, IRON, RETICCTPCT in the last 72 hours. Urinalysis    Component Value Date/Time   COLORURINE YELLOW 06/14/2018 2348   APPEARANCEUR HAZY (A) 06/14/2018 2348   LABSPEC >1.046 (H) 06/14/2018 2348   PHURINE 5.0 06/14/2018 2348   GLUCOSEU  NEGATIVE 06/14/2018  2348   HGBUR SMALL (A) 06/14/2018 2348   BILIRUBINUR NEGATIVE 06/14/2018 2348   KETONESUR NEGATIVE 06/14/2018 2348   PROTEINUR NEGATIVE 06/14/2018 2348   NITRITE NEGATIVE 06/14/2018 2348   LEUKOCYTESUR NEGATIVE 06/14/2018 2348   Sepsis Labs Invalid input(s): PROCALCITONIN,  WBC,  LACTICIDVEN Microbiology Recent Results (from the past 240 hour(s))  MRSA PCR Screening     Status: None   Collection Time: 06/14/18 10:32 PM  Result Value Ref Range Status   MRSA by PCR NEGATIVE NEGATIVE Final    Comment:        The GeneXpert MRSA Assay (FDA approved for NASAL specimens only), is one component of a comprehensive MRSA colonization surveillance program. It is not intended to diagnose MRSA infection nor to guide or monitor treatment for MRSA infections. Performed at [facility], [address], [place], [place] [address]   Culture, blood (x 2)     Status: None   Collection Time: 06/14/18 11:16 PM  Result Value Ref Range Status   Specimen Description NECK RIGHT  Final   Special Requests   Final    BOTTLES DRAWN AEROBIC AND ANAEROBIC Blood Culture adequate volume   Culture   Final    NO GROWTH 5 DAYS Performed at [facility], [address] [place], [place] [address]    Report Status 06/19/2018 FINAL  Final  Culture, blood (x 2)     Status: None   Collection Time: 06/14/18 11:16 PM  Result Value Ref Range Status   Specimen Description BLOOD RIGHT HAND  Final   Special Requests   Final    BOTTLES DRAWN AEROBIC AND ANAEROBIC Blood Culture adequate volume   Culture   Final    NO GROWTH 5 DAYS Performed at [facility], [address], [place], [place] [address]    Report Status 06/19/2018 FINAL  Final     Time coordinating discharge: 40 minutes  SIGNED:   Erick Blinks, DO [facility] 06/22/2018, 11:45 AM Pager [phone]  If 7PM-7AM, please contact night-coverage [email or web address] Password [email or web address]

## 2018-06-23 ENCOUNTER — Encounter (HOSPITAL_COMMUNITY): Payer: Self-pay

## 2018-06-23 DIAGNOSIS — D509 Iron deficiency anemia, unspecified: Secondary | ICD-10-CM | POA: Diagnosis not present

## 2018-06-23 DIAGNOSIS — M109 Gout, unspecified: Secondary | ICD-10-CM | POA: Diagnosis not present

## 2018-06-23 DIAGNOSIS — Z87440 Personal history of urinary (tract) infections: Secondary | ICD-10-CM | POA: Diagnosis not present

## 2018-06-23 DIAGNOSIS — Z7984 Long term (current) use of oral hypoglycemic drugs: Secondary | ICD-10-CM | POA: Diagnosis not present

## 2018-06-23 DIAGNOSIS — Z7901 Long term (current) use of anticoagulants: Secondary | ICD-10-CM | POA: Diagnosis not present

## 2018-06-23 DIAGNOSIS — I429 Cardiomyopathy, unspecified: Secondary | ICD-10-CM | POA: Diagnosis not present

## 2018-06-23 DIAGNOSIS — I272 Pulmonary hypertension, unspecified: Secondary | ICD-10-CM | POA: Diagnosis not present

## 2018-06-23 DIAGNOSIS — I4891 Unspecified atrial fibrillation: Secondary | ICD-10-CM | POA: Diagnosis not present

## 2018-06-23 DIAGNOSIS — E782 Mixed hyperlipidemia: Secondary | ICD-10-CM | POA: Diagnosis not present

## 2018-06-23 DIAGNOSIS — I110 Hypertensive heart disease with heart failure: Secondary | ICD-10-CM | POA: Diagnosis not present

## 2018-06-23 DIAGNOSIS — E119 Type 2 diabetes mellitus without complications: Secondary | ICD-10-CM | POA: Diagnosis not present

## 2018-06-23 DIAGNOSIS — Z9181 History of falling: Secondary | ICD-10-CM | POA: Diagnosis not present

## 2018-06-23 DIAGNOSIS — M1990 Unspecified osteoarthritis, unspecified site: Secondary | ICD-10-CM | POA: Diagnosis not present

## 2018-06-23 DIAGNOSIS — J439 Emphysema, unspecified: Secondary | ICD-10-CM | POA: Diagnosis not present

## 2018-06-23 DIAGNOSIS — I5022 Chronic systolic (congestive) heart failure: Secondary | ICD-10-CM | POA: Diagnosis not present

## 2018-06-23 DIAGNOSIS — Z8673 Personal history of transient ischemic attack (TIA), and cerebral infarction without residual deficits: Secondary | ICD-10-CM | POA: Diagnosis not present

## 2018-06-23 DIAGNOSIS — I2510 Atherosclerotic heart disease of native coronary artery without angina pectoris: Secondary | ICD-10-CM | POA: Diagnosis not present

## 2018-06-25 DIAGNOSIS — D509 Iron deficiency anemia, unspecified: Secondary | ICD-10-CM | POA: Diagnosis not present

## 2018-06-25 DIAGNOSIS — M1990 Unspecified osteoarthritis, unspecified site: Secondary | ICD-10-CM | POA: Diagnosis not present

## 2018-06-25 DIAGNOSIS — E782 Mixed hyperlipidemia: Secondary | ICD-10-CM | POA: Diagnosis not present

## 2018-06-25 DIAGNOSIS — Z9181 History of falling: Secondary | ICD-10-CM | POA: Diagnosis not present

## 2018-06-25 DIAGNOSIS — E119 Type 2 diabetes mellitus without complications: Secondary | ICD-10-CM | POA: Diagnosis not present

## 2018-06-25 DIAGNOSIS — M109 Gout, unspecified: Secondary | ICD-10-CM | POA: Diagnosis not present

## 2018-06-25 DIAGNOSIS — Z8673 Personal history of transient ischemic attack (TIA), and cerebral infarction without residual deficits: Secondary | ICD-10-CM | POA: Diagnosis not present

## 2018-06-25 DIAGNOSIS — J439 Emphysema, unspecified: Secondary | ICD-10-CM | POA: Diagnosis not present

## 2018-06-25 DIAGNOSIS — I4891 Unspecified atrial fibrillation: Secondary | ICD-10-CM | POA: Diagnosis not present

## 2018-06-25 DIAGNOSIS — Z7984 Long term (current) use of oral hypoglycemic drugs: Secondary | ICD-10-CM | POA: Diagnosis not present

## 2018-06-25 DIAGNOSIS — I2510 Atherosclerotic heart disease of native coronary artery without angina pectoris: Secondary | ICD-10-CM | POA: Diagnosis not present

## 2018-06-25 DIAGNOSIS — I110 Hypertensive heart disease with heart failure: Secondary | ICD-10-CM | POA: Diagnosis not present

## 2018-06-25 DIAGNOSIS — I5022 Chronic systolic (congestive) heart failure: Secondary | ICD-10-CM | POA: Diagnosis not present

## 2018-06-25 DIAGNOSIS — I429 Cardiomyopathy, unspecified: Secondary | ICD-10-CM | POA: Diagnosis not present

## 2018-06-25 DIAGNOSIS — Z7901 Long term (current) use of anticoagulants: Secondary | ICD-10-CM | POA: Diagnosis not present

## 2018-06-25 DIAGNOSIS — Z87440 Personal history of urinary (tract) infections: Secondary | ICD-10-CM | POA: Diagnosis not present

## 2018-06-27 ENCOUNTER — Other Ambulatory Visit: Payer: Self-pay | Admitting: *Deleted

## 2018-06-27 DIAGNOSIS — I5022 Chronic systolic (congestive) heart failure: Secondary | ICD-10-CM | POA: Diagnosis not present

## 2018-06-27 DIAGNOSIS — J449 Chronic obstructive pulmonary disease, unspecified: Secondary | ICD-10-CM | POA: Diagnosis not present

## 2018-06-27 DIAGNOSIS — D509 Iron deficiency anemia, unspecified: Secondary | ICD-10-CM | POA: Diagnosis not present

## 2018-06-27 DIAGNOSIS — I110 Hypertensive heart disease with heart failure: Secondary | ICD-10-CM | POA: Diagnosis not present

## 2018-06-27 DIAGNOSIS — Z7984 Long term (current) use of oral hypoglycemic drugs: Secondary | ICD-10-CM | POA: Diagnosis not present

## 2018-06-27 DIAGNOSIS — E119 Type 2 diabetes mellitus without complications: Secondary | ICD-10-CM | POA: Diagnosis not present

## 2018-06-27 DIAGNOSIS — M1990 Unspecified osteoarthritis, unspecified site: Secondary | ICD-10-CM | POA: Diagnosis not present

## 2018-06-27 DIAGNOSIS — Z87440 Personal history of urinary (tract) infections: Secondary | ICD-10-CM | POA: Diagnosis not present

## 2018-06-27 DIAGNOSIS — Z9181 History of falling: Secondary | ICD-10-CM | POA: Diagnosis not present

## 2018-06-27 DIAGNOSIS — I4891 Unspecified atrial fibrillation: Secondary | ICD-10-CM | POA: Diagnosis not present

## 2018-06-27 DIAGNOSIS — Z8673 Personal history of transient ischemic attack (TIA), and cerebral infarction without residual deficits: Secondary | ICD-10-CM | POA: Diagnosis not present

## 2018-06-27 DIAGNOSIS — Z433 Encounter for attention to colostomy: Secondary | ICD-10-CM | POA: Diagnosis not present

## 2018-06-27 DIAGNOSIS — I272 Pulmonary hypertension, unspecified: Secondary | ICD-10-CM | POA: Diagnosis not present

## 2018-06-27 DIAGNOSIS — M109 Gout, unspecified: Secondary | ICD-10-CM | POA: Diagnosis not present

## 2018-06-27 DIAGNOSIS — Z7901 Long term (current) use of anticoagulants: Secondary | ICD-10-CM | POA: Diagnosis not present

## 2018-06-27 DIAGNOSIS — I429 Cardiomyopathy, unspecified: Secondary | ICD-10-CM | POA: Diagnosis not present

## 2018-06-27 DIAGNOSIS — I2510 Atherosclerotic heart disease of native coronary artery without angina pectoris: Secondary | ICD-10-CM | POA: Diagnosis not present

## 2018-06-27 DIAGNOSIS — E782 Mixed hyperlipidemia: Secondary | ICD-10-CM | POA: Diagnosis not present

## 2018-06-27 DIAGNOSIS — J439 Emphysema, unspecified: Secondary | ICD-10-CM | POA: Diagnosis not present

## 2018-06-27 NOTE — Patient Outreach (Signed)
[facility] Care Management  06/27/2018  Michelle Wall August 05, 1933 [id number]   EMMI-  General discharge     RED ON EMMI ALERT Day # 1 Date: 06/24/18 Red Alert Reason: wounds healing well? No   Outreach attempt #1 successful at her home number  Patient is able to verify HIPAA [facility] RN reviewed and addressed red alert with patient Michelle Wall denies concerns with her wound healing she confirms items on her d/c instructions  She reports [facility] is present with her at the time of the call to her.   She confirms a follow up appointment with Dr Loleta ChanceHill, Primary MD in September and a follow up with the surgeon Algis GreenhouseLindsay Bridges on 06/30/18  She confirms her daughter who is staying with her is primary care giver and can assist with care and transportation to appointments.  She denies need for assist with or assist with changes for advance directives  She denies concerns with taking medications as prescribed, affording medications, side effects of medications and questions about medications She denies need of services from [facility]/Telephonic RN CM, pharmacy or SW at this time   Advised patient that there will be further automated EMMI- post discharge calls to assess how the patient is doing following the recent hospitalization Advised the patient that another call may be received from a nurse if any of their responses were abnormal. Patient voiced understanding and was appreciative of f/u call.  Plan: [facility] CM will close case at this time as patient has been assessed and no needs identified.    Michelle Dobosz L. Noelle PennerGibbs, RN, BSN, CCM [facility] Telephonic Care Management Care Coordinator Direct number [phone]  [facility] number [phone] Fax number [phone]

## 2018-06-28 DIAGNOSIS — E782 Mixed hyperlipidemia: Secondary | ICD-10-CM | POA: Diagnosis not present

## 2018-06-28 DIAGNOSIS — E119 Type 2 diabetes mellitus without complications: Secondary | ICD-10-CM | POA: Diagnosis not present

## 2018-06-29 DIAGNOSIS — I110 Hypertensive heart disease with heart failure: Secondary | ICD-10-CM | POA: Diagnosis not present

## 2018-06-29 DIAGNOSIS — Z9181 History of falling: Secondary | ICD-10-CM | POA: Diagnosis not present

## 2018-06-29 DIAGNOSIS — M109 Gout, unspecified: Secondary | ICD-10-CM | POA: Diagnosis not present

## 2018-06-29 DIAGNOSIS — I429 Cardiomyopathy, unspecified: Secondary | ICD-10-CM | POA: Diagnosis not present

## 2018-06-29 DIAGNOSIS — E782 Mixed hyperlipidemia: Secondary | ICD-10-CM | POA: Diagnosis not present

## 2018-06-29 DIAGNOSIS — Z433 Encounter for attention to colostomy: Secondary | ICD-10-CM | POA: Diagnosis not present

## 2018-06-29 DIAGNOSIS — Z7984 Long term (current) use of oral hypoglycemic drugs: Secondary | ICD-10-CM | POA: Diagnosis not present

## 2018-06-29 DIAGNOSIS — E119 Type 2 diabetes mellitus without complications: Secondary | ICD-10-CM | POA: Diagnosis not present

## 2018-06-29 DIAGNOSIS — I2510 Atherosclerotic heart disease of native coronary artery without angina pectoris: Secondary | ICD-10-CM | POA: Diagnosis not present

## 2018-06-29 DIAGNOSIS — I4891 Unspecified atrial fibrillation: Secondary | ICD-10-CM | POA: Diagnosis not present

## 2018-06-29 DIAGNOSIS — Z8673 Personal history of transient ischemic attack (TIA), and cerebral infarction without residual deficits: Secondary | ICD-10-CM | POA: Diagnosis not present

## 2018-06-29 DIAGNOSIS — M1990 Unspecified osteoarthritis, unspecified site: Secondary | ICD-10-CM | POA: Diagnosis not present

## 2018-06-29 DIAGNOSIS — I272 Pulmonary hypertension, unspecified: Secondary | ICD-10-CM | POA: Diagnosis not present

## 2018-06-29 DIAGNOSIS — Z87440 Personal history of urinary (tract) infections: Secondary | ICD-10-CM | POA: Diagnosis not present

## 2018-06-29 DIAGNOSIS — I5022 Chronic systolic (congestive) heart failure: Secondary | ICD-10-CM | POA: Diagnosis not present

## 2018-06-29 DIAGNOSIS — J439 Emphysema, unspecified: Secondary | ICD-10-CM | POA: Diagnosis not present

## 2018-06-29 DIAGNOSIS — J449 Chronic obstructive pulmonary disease, unspecified: Secondary | ICD-10-CM | POA: Diagnosis not present

## 2018-06-29 DIAGNOSIS — Z7901 Long term (current) use of anticoagulants: Secondary | ICD-10-CM | POA: Diagnosis not present

## 2018-06-29 DIAGNOSIS — D509 Iron deficiency anemia, unspecified: Secondary | ICD-10-CM | POA: Diagnosis not present

## 2018-06-30 ENCOUNTER — Encounter: Payer: Self-pay | Admitting: General Surgery

## 2018-06-30 ENCOUNTER — Ambulatory Visit (INDEPENDENT_AMBULATORY_CARE_PROVIDER_SITE_OTHER): Payer: Self-pay | Admitting: General Surgery

## 2018-06-30 VITALS — BP 130/60 | HR 88 | Temp 97.7°F | Resp 20

## 2018-06-30 DIAGNOSIS — Z933 Colostomy status: Secondary | ICD-10-CM

## 2018-06-30 DIAGNOSIS — D509 Iron deficiency anemia, unspecified: Secondary | ICD-10-CM | POA: Diagnosis not present

## 2018-06-30 DIAGNOSIS — Z8673 Personal history of transient ischemic attack (TIA), and cerebral infarction without residual deficits: Secondary | ICD-10-CM | POA: Diagnosis not present

## 2018-06-30 DIAGNOSIS — I5022 Chronic systolic (congestive) heart failure: Secondary | ICD-10-CM | POA: Diagnosis not present

## 2018-06-30 DIAGNOSIS — M1990 Unspecified osteoarthritis, unspecified site: Secondary | ICD-10-CM | POA: Diagnosis not present

## 2018-06-30 DIAGNOSIS — Z7901 Long term (current) use of anticoagulants: Secondary | ICD-10-CM | POA: Diagnosis not present

## 2018-06-30 DIAGNOSIS — I2510 Atherosclerotic heart disease of native coronary artery without angina pectoris: Secondary | ICD-10-CM | POA: Diagnosis not present

## 2018-06-30 DIAGNOSIS — E119 Type 2 diabetes mellitus without complications: Secondary | ICD-10-CM | POA: Diagnosis not present

## 2018-06-30 DIAGNOSIS — K631 Perforation of intestine (nontraumatic): Secondary | ICD-10-CM

## 2018-06-30 DIAGNOSIS — M109 Gout, unspecified: Secondary | ICD-10-CM | POA: Diagnosis not present

## 2018-06-30 DIAGNOSIS — I110 Hypertensive heart disease with heart failure: Secondary | ICD-10-CM | POA: Diagnosis not present

## 2018-06-30 DIAGNOSIS — E782 Mixed hyperlipidemia: Secondary | ICD-10-CM | POA: Diagnosis not present

## 2018-06-30 DIAGNOSIS — Z7984 Long term (current) use of oral hypoglycemic drugs: Secondary | ICD-10-CM | POA: Diagnosis not present

## 2018-06-30 DIAGNOSIS — I429 Cardiomyopathy, unspecified: Secondary | ICD-10-CM | POA: Diagnosis not present

## 2018-06-30 DIAGNOSIS — Z87440 Personal history of urinary (tract) infections: Secondary | ICD-10-CM | POA: Diagnosis not present

## 2018-06-30 DIAGNOSIS — J439 Emphysema, unspecified: Secondary | ICD-10-CM | POA: Diagnosis not present

## 2018-06-30 DIAGNOSIS — Z9181 History of falling: Secondary | ICD-10-CM | POA: Diagnosis not present

## 2018-06-30 DIAGNOSIS — I4891 Unspecified atrial fibrillation: Secondary | ICD-10-CM | POA: Diagnosis not present

## 2018-06-30 MED ORDER — DOCUSATE SODIUM 100 MG PO CAPS: 100.0000 mg | ORAL_CAPSULE | Freq: Two times a day (BID) | ORAL | 2 refills | Status: DC

## 2018-06-30 NOTE — Progress Notes (Signed)
[facility] Note   HPI:  84 y.o. Female presents to clinic for post-op follow-up evaluation after a sigmoid colectomy with end colostomy for perforated sigmoid from diverticulitis. Doing well.   Review of Systems:  No fevers or chills Regular output from ostomy Packing going well All other review of systems: otherwise negative   Vital Signs:  BP 130/60 (BP Location: Left Arm, Patient Position: Sitting, Cuff Size: Normal)   Pulse 88   Temp 97.7 F (36.5 C) (Temporal)   Resp 20    Physical Exam:  Physical Exam  Cardiovascular: Normal rate.  Pulmonary/Chest: Effort normal.  Abdominal: Soft. She exhibits no distension. There is no tenderness.  Ostomy with stool in bag, midline with packing, good granulation, no erythema or drainage  Vitals reviewed.   Laboratory studies: None    Imaging:  None    Assessment:  84 y.o. yo Female s/p sigmoid colectomy with end colostomy for perforated sigmoid from diverticulitis.  Plan:  Return November to discuss options for reversal if desired. If you do not wish to reverse the colostomy, you do not need to return to visit unless having issues. No heavy lifting >10 lbs, excessive bending, squatting, pushing, pulling for 6-8 weeks after surgery. Diet as tolerated. Ostomy care as per RN instructions. Packing can be daily with damp to dry gauze and as needed if soiled in the wound.  Shower per regular routine.  Return in 3 weeks for a wound check.    All of the above recommendations were discussed with the patient and patient's family, and all of patient's and family's questions were answered to their expressed satisfaction.  Algis GreenhouseLindsay Bridges, MD [facility] [address] [place], [place] [id number] [phone] (office)

## 2018-06-30 NOTE — Patient Instructions (Addendum)
Return November to discuss options for reversal if desired. If you do not wish to reverse the colostomy, you do not need to return to visit unless having issues. No heavy lifting >10 lbs, excessive bending, squatting, pushing, pulling for 6-8 weeks after surgery. Diet as tolerated. Ostomy care as per RN instructions. Packing can be daily with damp to dry gauze and as needed if soiled in the wound. Shower per regular routine.  Return in 3 weeks for a wound check.

## 2018-07-01 DIAGNOSIS — I429 Cardiomyopathy, unspecified: Secondary | ICD-10-CM | POA: Diagnosis not present

## 2018-07-01 DIAGNOSIS — I5022 Chronic systolic (congestive) heart failure: Secondary | ICD-10-CM | POA: Diagnosis not present

## 2018-07-01 DIAGNOSIS — Z7901 Long term (current) use of anticoagulants: Secondary | ICD-10-CM | POA: Diagnosis not present

## 2018-07-01 DIAGNOSIS — Z8673 Personal history of transient ischemic attack (TIA), and cerebral infarction without residual deficits: Secondary | ICD-10-CM | POA: Diagnosis not present

## 2018-07-01 DIAGNOSIS — I4891 Unspecified atrial fibrillation: Secondary | ICD-10-CM | POA: Diagnosis not present

## 2018-07-01 DIAGNOSIS — J439 Emphysema, unspecified: Secondary | ICD-10-CM | POA: Diagnosis not present

## 2018-07-01 DIAGNOSIS — D509 Iron deficiency anemia, unspecified: Secondary | ICD-10-CM | POA: Diagnosis not present

## 2018-07-01 DIAGNOSIS — E782 Mixed hyperlipidemia: Secondary | ICD-10-CM | POA: Diagnosis not present

## 2018-07-01 DIAGNOSIS — M1990 Unspecified osteoarthritis, unspecified site: Secondary | ICD-10-CM | POA: Diagnosis not present

## 2018-07-01 DIAGNOSIS — Z87440 Personal history of urinary (tract) infections: Secondary | ICD-10-CM | POA: Diagnosis not present

## 2018-07-01 DIAGNOSIS — Z9181 History of falling: Secondary | ICD-10-CM | POA: Diagnosis not present

## 2018-07-01 DIAGNOSIS — Z7984 Long term (current) use of oral hypoglycemic drugs: Secondary | ICD-10-CM | POA: Diagnosis not present

## 2018-07-01 DIAGNOSIS — M109 Gout, unspecified: Secondary | ICD-10-CM | POA: Diagnosis not present

## 2018-07-01 DIAGNOSIS — I110 Hypertensive heart disease with heart failure: Secondary | ICD-10-CM | POA: Diagnosis not present

## 2018-07-01 DIAGNOSIS — I2510 Atherosclerotic heart disease of native coronary artery without angina pectoris: Secondary | ICD-10-CM | POA: Diagnosis not present

## 2018-07-01 DIAGNOSIS — E119 Type 2 diabetes mellitus without complications: Secondary | ICD-10-CM | POA: Diagnosis not present

## 2018-07-05 DIAGNOSIS — I2510 Atherosclerotic heart disease of native coronary artery without angina pectoris: Secondary | ICD-10-CM | POA: Diagnosis not present

## 2018-07-05 DIAGNOSIS — I4891 Unspecified atrial fibrillation: Secondary | ICD-10-CM | POA: Diagnosis not present

## 2018-07-05 DIAGNOSIS — Z7984 Long term (current) use of oral hypoglycemic drugs: Secondary | ICD-10-CM | POA: Diagnosis not present

## 2018-07-05 DIAGNOSIS — Z9181 History of falling: Secondary | ICD-10-CM | POA: Diagnosis not present

## 2018-07-05 DIAGNOSIS — Z8673 Personal history of transient ischemic attack (TIA), and cerebral infarction without residual deficits: Secondary | ICD-10-CM | POA: Diagnosis not present

## 2018-07-05 DIAGNOSIS — I5022 Chronic systolic (congestive) heart failure: Secondary | ICD-10-CM | POA: Diagnosis not present

## 2018-07-05 DIAGNOSIS — D509 Iron deficiency anemia, unspecified: Secondary | ICD-10-CM | POA: Diagnosis not present

## 2018-07-05 DIAGNOSIS — J439 Emphysema, unspecified: Secondary | ICD-10-CM | POA: Diagnosis not present

## 2018-07-05 DIAGNOSIS — E119 Type 2 diabetes mellitus without complications: Secondary | ICD-10-CM | POA: Diagnosis not present

## 2018-07-05 DIAGNOSIS — M109 Gout, unspecified: Secondary | ICD-10-CM | POA: Diagnosis not present

## 2018-07-05 DIAGNOSIS — E782 Mixed hyperlipidemia: Secondary | ICD-10-CM | POA: Diagnosis not present

## 2018-07-05 DIAGNOSIS — Z87440 Personal history of urinary (tract) infections: Secondary | ICD-10-CM | POA: Diagnosis not present

## 2018-07-05 DIAGNOSIS — I110 Hypertensive heart disease with heart failure: Secondary | ICD-10-CM | POA: Diagnosis not present

## 2018-07-05 DIAGNOSIS — I429 Cardiomyopathy, unspecified: Secondary | ICD-10-CM | POA: Diagnosis not present

## 2018-07-05 DIAGNOSIS — M1990 Unspecified osteoarthritis, unspecified site: Secondary | ICD-10-CM | POA: Diagnosis not present

## 2018-07-05 DIAGNOSIS — Z7901 Long term (current) use of anticoagulants: Secondary | ICD-10-CM | POA: Diagnosis not present

## 2018-07-06 DIAGNOSIS — Z8673 Personal history of transient ischemic attack (TIA), and cerebral infarction without residual deficits: Secondary | ICD-10-CM | POA: Diagnosis not present

## 2018-07-06 DIAGNOSIS — M109 Gout, unspecified: Secondary | ICD-10-CM | POA: Diagnosis not present

## 2018-07-06 DIAGNOSIS — E119 Type 2 diabetes mellitus without complications: Secondary | ICD-10-CM | POA: Diagnosis not present

## 2018-07-06 DIAGNOSIS — J439 Emphysema, unspecified: Secondary | ICD-10-CM | POA: Diagnosis not present

## 2018-07-06 DIAGNOSIS — I429 Cardiomyopathy, unspecified: Secondary | ICD-10-CM | POA: Diagnosis not present

## 2018-07-06 DIAGNOSIS — E782 Mixed hyperlipidemia: Secondary | ICD-10-CM | POA: Diagnosis not present

## 2018-07-06 DIAGNOSIS — Z87440 Personal history of urinary (tract) infections: Secondary | ICD-10-CM | POA: Diagnosis not present

## 2018-07-06 DIAGNOSIS — I5022 Chronic systolic (congestive) heart failure: Secondary | ICD-10-CM | POA: Diagnosis not present

## 2018-07-06 DIAGNOSIS — I110 Hypertensive heart disease with heart failure: Secondary | ICD-10-CM | POA: Diagnosis not present

## 2018-07-06 DIAGNOSIS — M1990 Unspecified osteoarthritis, unspecified site: Secondary | ICD-10-CM | POA: Diagnosis not present

## 2018-07-06 DIAGNOSIS — Z7984 Long term (current) use of oral hypoglycemic drugs: Secondary | ICD-10-CM | POA: Diagnosis not present

## 2018-07-06 DIAGNOSIS — I4891 Unspecified atrial fibrillation: Secondary | ICD-10-CM | POA: Diagnosis not present

## 2018-07-06 DIAGNOSIS — Z9181 History of falling: Secondary | ICD-10-CM | POA: Diagnosis not present

## 2018-07-06 DIAGNOSIS — I2510 Atherosclerotic heart disease of native coronary artery without angina pectoris: Secondary | ICD-10-CM | POA: Diagnosis not present

## 2018-07-06 DIAGNOSIS — Z7901 Long term (current) use of anticoagulants: Secondary | ICD-10-CM | POA: Diagnosis not present

## 2018-07-06 DIAGNOSIS — D509 Iron deficiency anemia, unspecified: Secondary | ICD-10-CM | POA: Diagnosis not present

## 2018-07-07 DIAGNOSIS — I429 Cardiomyopathy, unspecified: Secondary | ICD-10-CM | POA: Diagnosis not present

## 2018-07-07 DIAGNOSIS — I4891 Unspecified atrial fibrillation: Secondary | ICD-10-CM | POA: Diagnosis not present

## 2018-07-07 DIAGNOSIS — Z87440 Personal history of urinary (tract) infections: Secondary | ICD-10-CM | POA: Diagnosis not present

## 2018-07-07 DIAGNOSIS — I2510 Atherosclerotic heart disease of native coronary artery without angina pectoris: Secondary | ICD-10-CM | POA: Diagnosis not present

## 2018-07-07 DIAGNOSIS — Z9181 History of falling: Secondary | ICD-10-CM | POA: Diagnosis not present

## 2018-07-07 DIAGNOSIS — Z7901 Long term (current) use of anticoagulants: Secondary | ICD-10-CM | POA: Diagnosis not present

## 2018-07-07 DIAGNOSIS — Z8673 Personal history of transient ischemic attack (TIA), and cerebral infarction without residual deficits: Secondary | ICD-10-CM | POA: Diagnosis not present

## 2018-07-07 DIAGNOSIS — I110 Hypertensive heart disease with heart failure: Secondary | ICD-10-CM | POA: Diagnosis not present

## 2018-07-07 DIAGNOSIS — Z7984 Long term (current) use of oral hypoglycemic drugs: Secondary | ICD-10-CM | POA: Diagnosis not present

## 2018-07-07 DIAGNOSIS — I5022 Chronic systolic (congestive) heart failure: Secondary | ICD-10-CM | POA: Diagnosis not present

## 2018-07-07 DIAGNOSIS — J439 Emphysema, unspecified: Secondary | ICD-10-CM | POA: Diagnosis not present

## 2018-07-07 DIAGNOSIS — M1990 Unspecified osteoarthritis, unspecified site: Secondary | ICD-10-CM | POA: Diagnosis not present

## 2018-07-07 DIAGNOSIS — D509 Iron deficiency anemia, unspecified: Secondary | ICD-10-CM | POA: Diagnosis not present

## 2018-07-07 DIAGNOSIS — E782 Mixed hyperlipidemia: Secondary | ICD-10-CM | POA: Diagnosis not present

## 2018-07-07 DIAGNOSIS — M109 Gout, unspecified: Secondary | ICD-10-CM | POA: Diagnosis not present

## 2018-07-07 DIAGNOSIS — E119 Type 2 diabetes mellitus without complications: Secondary | ICD-10-CM | POA: Diagnosis not present

## 2018-07-11 DIAGNOSIS — M109 Gout, unspecified: Secondary | ICD-10-CM | POA: Diagnosis not present

## 2018-07-11 DIAGNOSIS — Z9181 History of falling: Secondary | ICD-10-CM | POA: Diagnosis not present

## 2018-07-11 DIAGNOSIS — I110 Hypertensive heart disease with heart failure: Secondary | ICD-10-CM | POA: Diagnosis not present

## 2018-07-11 DIAGNOSIS — I4891 Unspecified atrial fibrillation: Secondary | ICD-10-CM | POA: Diagnosis not present

## 2018-07-11 DIAGNOSIS — D509 Iron deficiency anemia, unspecified: Secondary | ICD-10-CM | POA: Diagnosis not present

## 2018-07-11 DIAGNOSIS — I429 Cardiomyopathy, unspecified: Secondary | ICD-10-CM | POA: Diagnosis not present

## 2018-07-11 DIAGNOSIS — M1990 Unspecified osteoarthritis, unspecified site: Secondary | ICD-10-CM | POA: Diagnosis not present

## 2018-07-11 DIAGNOSIS — Z7901 Long term (current) use of anticoagulants: Secondary | ICD-10-CM | POA: Diagnosis not present

## 2018-07-11 DIAGNOSIS — Z87440 Personal history of urinary (tract) infections: Secondary | ICD-10-CM | POA: Diagnosis not present

## 2018-07-11 DIAGNOSIS — I5022 Chronic systolic (congestive) heart failure: Secondary | ICD-10-CM | POA: Diagnosis not present

## 2018-07-11 DIAGNOSIS — E119 Type 2 diabetes mellitus without complications: Secondary | ICD-10-CM | POA: Diagnosis not present

## 2018-07-11 DIAGNOSIS — Z8673 Personal history of transient ischemic attack (TIA), and cerebral infarction without residual deficits: Secondary | ICD-10-CM | POA: Diagnosis not present

## 2018-07-11 DIAGNOSIS — E782 Mixed hyperlipidemia: Secondary | ICD-10-CM | POA: Diagnosis not present

## 2018-07-11 DIAGNOSIS — J439 Emphysema, unspecified: Secondary | ICD-10-CM | POA: Diagnosis not present

## 2018-07-11 DIAGNOSIS — Z7984 Long term (current) use of oral hypoglycemic drugs: Secondary | ICD-10-CM | POA: Diagnosis not present

## 2018-07-11 DIAGNOSIS — I2510 Atherosclerotic heart disease of native coronary artery without angina pectoris: Secondary | ICD-10-CM | POA: Diagnosis not present

## 2018-07-12 DIAGNOSIS — I110 Hypertensive heart disease with heart failure: Secondary | ICD-10-CM | POA: Diagnosis not present

## 2018-07-12 DIAGNOSIS — M109 Gout, unspecified: Secondary | ICD-10-CM | POA: Diagnosis not present

## 2018-07-12 DIAGNOSIS — M1990 Unspecified osteoarthritis, unspecified site: Secondary | ICD-10-CM | POA: Diagnosis not present

## 2018-07-12 DIAGNOSIS — I429 Cardiomyopathy, unspecified: Secondary | ICD-10-CM | POA: Diagnosis not present

## 2018-07-12 DIAGNOSIS — I5022 Chronic systolic (congestive) heart failure: Secondary | ICD-10-CM | POA: Diagnosis not present

## 2018-07-12 DIAGNOSIS — I2510 Atherosclerotic heart disease of native coronary artery without angina pectoris: Secondary | ICD-10-CM | POA: Diagnosis not present

## 2018-07-12 DIAGNOSIS — Z7901 Long term (current) use of anticoagulants: Secondary | ICD-10-CM | POA: Diagnosis not present

## 2018-07-12 DIAGNOSIS — Z9181 History of falling: Secondary | ICD-10-CM | POA: Diagnosis not present

## 2018-07-12 DIAGNOSIS — E119 Type 2 diabetes mellitus without complications: Secondary | ICD-10-CM | POA: Diagnosis not present

## 2018-07-12 DIAGNOSIS — D509 Iron deficiency anemia, unspecified: Secondary | ICD-10-CM | POA: Diagnosis not present

## 2018-07-12 DIAGNOSIS — Z87440 Personal history of urinary (tract) infections: Secondary | ICD-10-CM | POA: Diagnosis not present

## 2018-07-12 DIAGNOSIS — Z8673 Personal history of transient ischemic attack (TIA), and cerebral infarction without residual deficits: Secondary | ICD-10-CM | POA: Diagnosis not present

## 2018-07-12 DIAGNOSIS — E782 Mixed hyperlipidemia: Secondary | ICD-10-CM | POA: Diagnosis not present

## 2018-07-12 DIAGNOSIS — Z7984 Long term (current) use of oral hypoglycemic drugs: Secondary | ICD-10-CM | POA: Diagnosis not present

## 2018-07-12 DIAGNOSIS — J439 Emphysema, unspecified: Secondary | ICD-10-CM | POA: Diagnosis not present

## 2018-07-12 DIAGNOSIS — I4891 Unspecified atrial fibrillation: Secondary | ICD-10-CM | POA: Diagnosis not present

## 2018-07-14 DIAGNOSIS — E119 Type 2 diabetes mellitus without complications: Secondary | ICD-10-CM | POA: Diagnosis not present

## 2018-07-14 DIAGNOSIS — M109 Gout, unspecified: Secondary | ICD-10-CM | POA: Diagnosis not present

## 2018-07-14 DIAGNOSIS — J439 Emphysema, unspecified: Secondary | ICD-10-CM | POA: Diagnosis not present

## 2018-07-14 DIAGNOSIS — M1990 Unspecified osteoarthritis, unspecified site: Secondary | ICD-10-CM | POA: Diagnosis not present

## 2018-07-14 DIAGNOSIS — Z7984 Long term (current) use of oral hypoglycemic drugs: Secondary | ICD-10-CM | POA: Diagnosis not present

## 2018-07-14 DIAGNOSIS — E782 Mixed hyperlipidemia: Secondary | ICD-10-CM | POA: Diagnosis not present

## 2018-07-14 DIAGNOSIS — I5022 Chronic systolic (congestive) heart failure: Secondary | ICD-10-CM | POA: Diagnosis not present

## 2018-07-14 DIAGNOSIS — D509 Iron deficiency anemia, unspecified: Secondary | ICD-10-CM | POA: Diagnosis not present

## 2018-07-14 DIAGNOSIS — I110 Hypertensive heart disease with heart failure: Secondary | ICD-10-CM | POA: Diagnosis not present

## 2018-07-14 DIAGNOSIS — Z87440 Personal history of urinary (tract) infections: Secondary | ICD-10-CM | POA: Diagnosis not present

## 2018-07-14 DIAGNOSIS — Z7901 Long term (current) use of anticoagulants: Secondary | ICD-10-CM | POA: Diagnosis not present

## 2018-07-14 DIAGNOSIS — I4891 Unspecified atrial fibrillation: Secondary | ICD-10-CM | POA: Diagnosis not present

## 2018-07-14 DIAGNOSIS — Z8673 Personal history of transient ischemic attack (TIA), and cerebral infarction without residual deficits: Secondary | ICD-10-CM | POA: Diagnosis not present

## 2018-07-14 DIAGNOSIS — I2510 Atherosclerotic heart disease of native coronary artery without angina pectoris: Secondary | ICD-10-CM | POA: Diagnosis not present

## 2018-07-14 DIAGNOSIS — Z9181 History of falling: Secondary | ICD-10-CM | POA: Diagnosis not present

## 2018-07-14 DIAGNOSIS — I429 Cardiomyopathy, unspecified: Secondary | ICD-10-CM | POA: Diagnosis not present

## 2018-07-15 DIAGNOSIS — I272 Pulmonary hypertension, unspecified: Secondary | ICD-10-CM | POA: Diagnosis not present

## 2018-07-15 DIAGNOSIS — Z433 Encounter for attention to colostomy: Secondary | ICD-10-CM | POA: Diagnosis not present

## 2018-07-15 DIAGNOSIS — J449 Chronic obstructive pulmonary disease, unspecified: Secondary | ICD-10-CM | POA: Diagnosis not present

## 2018-07-19 DIAGNOSIS — Z7984 Long term (current) use of oral hypoglycemic drugs: Secondary | ICD-10-CM | POA: Diagnosis not present

## 2018-07-19 DIAGNOSIS — I2510 Atherosclerotic heart disease of native coronary artery without angina pectoris: Secondary | ICD-10-CM | POA: Diagnosis not present

## 2018-07-19 DIAGNOSIS — I110 Hypertensive heart disease with heart failure: Secondary | ICD-10-CM | POA: Diagnosis not present

## 2018-07-19 DIAGNOSIS — I5022 Chronic systolic (congestive) heart failure: Secondary | ICD-10-CM | POA: Diagnosis not present

## 2018-07-19 DIAGNOSIS — E119 Type 2 diabetes mellitus without complications: Secondary | ICD-10-CM | POA: Diagnosis not present

## 2018-07-19 DIAGNOSIS — M1990 Unspecified osteoarthritis, unspecified site: Secondary | ICD-10-CM | POA: Diagnosis not present

## 2018-07-19 DIAGNOSIS — M109 Gout, unspecified: Secondary | ICD-10-CM | POA: Diagnosis not present

## 2018-07-19 DIAGNOSIS — E782 Mixed hyperlipidemia: Secondary | ICD-10-CM | POA: Diagnosis not present

## 2018-07-19 DIAGNOSIS — J439 Emphysema, unspecified: Secondary | ICD-10-CM | POA: Diagnosis not present

## 2018-07-19 DIAGNOSIS — Z7901 Long term (current) use of anticoagulants: Secondary | ICD-10-CM | POA: Diagnosis not present

## 2018-07-19 DIAGNOSIS — Z8673 Personal history of transient ischemic attack (TIA), and cerebral infarction without residual deficits: Secondary | ICD-10-CM | POA: Diagnosis not present

## 2018-07-19 DIAGNOSIS — I4891 Unspecified atrial fibrillation: Secondary | ICD-10-CM | POA: Diagnosis not present

## 2018-07-19 DIAGNOSIS — Z87440 Personal history of urinary (tract) infections: Secondary | ICD-10-CM | POA: Diagnosis not present

## 2018-07-19 DIAGNOSIS — D509 Iron deficiency anemia, unspecified: Secondary | ICD-10-CM | POA: Diagnosis not present

## 2018-07-19 DIAGNOSIS — Z9181 History of falling: Secondary | ICD-10-CM | POA: Diagnosis not present

## 2018-07-19 DIAGNOSIS — I429 Cardiomyopathy, unspecified: Secondary | ICD-10-CM | POA: Diagnosis not present

## 2018-07-24 DIAGNOSIS — I272 Pulmonary hypertension, unspecified: Secondary | ICD-10-CM | POA: Diagnosis not present

## 2018-07-26 DIAGNOSIS — E782 Mixed hyperlipidemia: Secondary | ICD-10-CM | POA: Diagnosis not present

## 2018-07-26 DIAGNOSIS — E119 Type 2 diabetes mellitus without complications: Secondary | ICD-10-CM | POA: Diagnosis not present

## 2018-07-26 DIAGNOSIS — M109 Gout, unspecified: Secondary | ICD-10-CM | POA: Diagnosis not present

## 2018-07-26 DIAGNOSIS — I5022 Chronic systolic (congestive) heart failure: Secondary | ICD-10-CM | POA: Diagnosis not present

## 2018-07-26 DIAGNOSIS — Z7901 Long term (current) use of anticoagulants: Secondary | ICD-10-CM | POA: Diagnosis not present

## 2018-07-26 DIAGNOSIS — Z7984 Long term (current) use of oral hypoglycemic drugs: Secondary | ICD-10-CM | POA: Diagnosis not present

## 2018-07-26 DIAGNOSIS — I4891 Unspecified atrial fibrillation: Secondary | ICD-10-CM | POA: Diagnosis not present

## 2018-07-26 DIAGNOSIS — Z8673 Personal history of transient ischemic attack (TIA), and cerebral infarction without residual deficits: Secondary | ICD-10-CM | POA: Diagnosis not present

## 2018-07-26 DIAGNOSIS — I429 Cardiomyopathy, unspecified: Secondary | ICD-10-CM | POA: Diagnosis not present

## 2018-07-26 DIAGNOSIS — I2510 Atherosclerotic heart disease of native coronary artery without angina pectoris: Secondary | ICD-10-CM | POA: Diagnosis not present

## 2018-07-26 DIAGNOSIS — I110 Hypertensive heart disease with heart failure: Secondary | ICD-10-CM | POA: Diagnosis not present

## 2018-07-26 DIAGNOSIS — Z9181 History of falling: Secondary | ICD-10-CM | POA: Diagnosis not present

## 2018-07-26 DIAGNOSIS — D509 Iron deficiency anemia, unspecified: Secondary | ICD-10-CM | POA: Diagnosis not present

## 2018-07-26 DIAGNOSIS — M1990 Unspecified osteoarthritis, unspecified site: Secondary | ICD-10-CM | POA: Diagnosis not present

## 2018-07-26 DIAGNOSIS — Z87440 Personal history of urinary (tract) infections: Secondary | ICD-10-CM | POA: Diagnosis not present

## 2018-07-26 DIAGNOSIS — J439 Emphysema, unspecified: Secondary | ICD-10-CM | POA: Diagnosis not present

## 2018-08-08 DIAGNOSIS — E039 Hypothyroidism, unspecified: Secondary | ICD-10-CM | POA: Diagnosis not present

## 2018-08-08 DIAGNOSIS — I10 Essential (primary) hypertension: Secondary | ICD-10-CM | POA: Diagnosis not present

## 2018-08-08 DIAGNOSIS — Z720 Tobacco use: Secondary | ICD-10-CM | POA: Diagnosis not present

## 2018-08-09 DIAGNOSIS — Z433 Encounter for attention to colostomy: Secondary | ICD-10-CM | POA: Diagnosis not present

## 2018-08-09 DIAGNOSIS — J449 Chronic obstructive pulmonary disease, unspecified: Secondary | ICD-10-CM | POA: Diagnosis not present

## 2018-08-09 DIAGNOSIS — I272 Pulmonary hypertension, unspecified: Secondary | ICD-10-CM | POA: Diagnosis not present

## 2018-08-23 DIAGNOSIS — I272 Pulmonary hypertension, unspecified: Secondary | ICD-10-CM | POA: Diagnosis not present

## 2018-08-25 DIAGNOSIS — I5022 Chronic systolic (congestive) heart failure: Secondary | ICD-10-CM | POA: Diagnosis not present

## 2018-08-25 DIAGNOSIS — I110 Hypertensive heart disease with heart failure: Secondary | ICD-10-CM | POA: Diagnosis not present

## 2018-08-25 DIAGNOSIS — E119 Type 2 diabetes mellitus without complications: Secondary | ICD-10-CM | POA: Diagnosis not present

## 2018-08-25 DIAGNOSIS — I4891 Unspecified atrial fibrillation: Secondary | ICD-10-CM | POA: Diagnosis not present

## 2018-09-15 DIAGNOSIS — Z933 Colostomy status: Secondary | ICD-10-CM | POA: Diagnosis not present

## 2018-09-15 DIAGNOSIS — K56609 Unspecified intestinal obstruction, unspecified as to partial versus complete obstruction: Secondary | ICD-10-CM | POA: Diagnosis not present

## 2018-09-23 DIAGNOSIS — I272 Pulmonary hypertension, unspecified: Secondary | ICD-10-CM | POA: Diagnosis not present

## 2018-10-04 ENCOUNTER — Encounter: Payer: Self-pay | Admitting: General Surgery

## 2018-10-04 ENCOUNTER — Ambulatory Visit (INDEPENDENT_AMBULATORY_CARE_PROVIDER_SITE_OTHER): Payer: Medicare Other | Admitting: General Surgery

## 2018-10-04 VITALS — BP 159/76 | HR 98 | Temp 98.4°F | Resp 18 | Wt 130.6 lb

## 2018-10-04 DIAGNOSIS — Z933 Colostomy status: Secondary | ICD-10-CM | POA: Insufficient documentation

## 2018-10-04 NOTE — Progress Notes (Signed)
[facility] History and Physical   Chief Complaint    Colostomy in place      Michelle Wall is a 84 y.o. female.  HPI: [name] is a 84 yo who is well known to me and has a history of emergency surgery for fecal peritonitis and underwent an hartman's procedure with end colostomy for perforated diverticulitis found on pathology. She has been doing well and last saw me post operatively in the summer. Her midline wound has healed by secondary intention and she is managing her ostomy without issues. She complains mostly about the bag causing her discomfort at times. She empties the bag once a day.  She says overall she is feeling well and has no complaints. She denies any abdominal pain, nausea /vomiting or issues with hernia formation.  She has never had a colonoscopy.    Past Medical History:  Diagnosis Date  . Carotid atherosclerosis   . Cor pulmonale (HCC)   . Emphysema   . Hypertension   . Hypothyroidism   . Pulmonary hypertension (HCC)    moderate  last 2D echo EF greater than 55%  mild to moderate tricuspid insufficiency  . Tricuspid insufficiency    moderate to severe    Past Surgical History:  Procedure Laterality Date  . COLOSTOMY Left 06/14/2018   Procedure: COLOSTOMY;  Surgeon: Lucretia RoersBridges, Lindsay C, MD;  Location: [facility];  Service: General;  Laterality: Left;  . PARTIAL COLECTOMY N/A 06/14/2018   Procedure: PARTIAL COLECTOMY;  Surgeon: Lucretia RoersBridges, Lindsay C, MD;  Location: [facility];  Service: General;  Laterality: N/A;  . THYROIDECTOMY, PARTIAL    . TUBAL LIGATION     midline scar ? possibly tubal ligation    Family History  Problem Relation Age of Onset  . Cancer Other     Social History   Tobacco Use  . Smoking status: Current Every Day Smoker    Packs/day: 1.00    Years: 60.00    Pack years: 60.00    Types: Cigarettes  . Smokeless tobacco: Never Used  Substance Use Topics  . Alcohol use: No  . Drug use: No    Medications: I have  reviewed the patient's current medications. Allergies as of 10/04/2018   No Known Allergies     Medication List        Accurate as of 10/04/18 11:59 PM. Always use your most recent med list.          aspirin EC 81 MG tablet Take 81 mg by mouth daily.   CALCIUM-VITAMIN D PO Take 1 tablet by mouth 2 (two) times daily.   docusate sodium 100 MG capsule Commonly known as:  COLACE Take 1 capsule (100 mg total) by mouth 2 (two) times daily.   hydrochlorothiazide 12.5 MG tablet Commonly known as:  HYDRODIURIL Take 12.5 mg by mouth daily.   latanoprost 0.005 % ophthalmic solution Commonly known as:  XALATAN Place 1 drop into both eyes at bedtime.   lisinopril 20 MG tablet Commonly known as:  PRINIVIL,ZESTRIL Take 20 mg by mouth 2 (two) times daily.   SYNTHROID 100 MCG tablet Generic drug:  levothyroxine TK 1 T PO Q MORNING.   verapamil 240 MG CR tablet Commonly known as:  CALAN-SR Take 240 mg by mouth 2 (two) times daily.        ROS:  A comprehensive review of systems was negative except for: Gastrointestinal: positive for colostomy in place LLQ  Blood pressure (!) 159/76, pulse 98, temperature 98.4  F (36.9 C), temperature source Temporal, resp. rate 18, weight 130 lb 9.6 oz (59.2 kg). Physical Exam  Constitutional: She is oriented to person, place, and time. She appears well-developed and well-nourished.  HENT:  Head: Normocephalic and atraumatic.  Eyes: Pupils are equal, round, and reactive to light.  Neck: Normal range of motion.  Cardiovascular: Normal rate and regular rhythm.  Pulmonary/Chest: Effort normal and breath sounds normal.  Abdominal: Soft. She exhibits no distension. There is no tenderness.  Midline healed, no hernia noted, ostomy in LLQ, bag in place with stool in bag  Musculoskeletal: Normal range of motion.  Neurological: She is alert and oriented to person, place, and time.  Skin: Skin is warm and dry.  Psychiatric: She has a normal mood  and affect. Her behavior is normal. Judgment and thought content normal.  Vitals reviewed.   Results: None   Assessment & Plan:  Michelle Wall is a 84 y.o. female with an end colostomy. She is here today to discuss her options for possible reversal. She has been managing the ostomy without issues and has healed up nicely. She has never had a colonoscopy. We discussed the option of reversal and the option of keeping the ostomy. We discussed the healing time and the time in the hospital being about 5 days.  We discussed lifting and activity restrictions after ostomy reversal, and we discussed the steps needed before reversal including a colonoscopy and barium enema to assess the rectal stump.   -Refer to Dr. Karilyn Cota for colonoscopy, first time screening -Patient will speak with her family and make a decision about reversal or not  -Follow up after decision/ colonoscopy   All questions were answered to the satisfaction of the patient and family.   Lucretia Roers 10/05/2018, 11:53 AM

## 2018-10-04 NOTE — Patient Instructions (Addendum)
Referral to gastroenterology for colonoscopy.   End Colostomy Reversal An end colostomy reversal is surgery that reverses an end colostomy. In this reversal procedure, the large intestine is disconnected from the opening in the abdomen (stoma). Then, it is reconnected to the rest of the large intestine inside the body. After this surgery, a stoma and colostomy bag are no longer needed. Stool (feces) can leave your body through the rectum, as it did before you had an end colostomy. Tell a health care provider about:  Any allergies you have.  All medicines you are taking, including vitamins, herbs, eye drops, creams, and over-the-counter medicines.  Any problems you or family members have had with anesthetic medicines.  Any blood disorders you have.  Any surgeries you have had.  Any medical conditions you have.  Whether you are pregnant or may be pregnant. What are the risks? Generally, this is a safe procedure. However, problems may occur, including:  Infection.  Bleeding.  Allergic reactions to medicines.  Damage to other structures or organs.  A temporary condition in which the intestines stop moving and working correctly (ileus). This usually goes away in 3-7 days.  Leaking at the area of the intestine (anastomotic leak) where it was reconnected.  A collection of pus (abscess) in the abdomen or pelvis.  Intestinal blockage.  Narrowing of the intestine (stricture) at the place where it was reconnected.  Urinary and sexual dysfunction.  What happens before the procedure?  Follow instructions from your health care provider about eating or drinking restrictions.  Ask your health care provider about: ? Changing or stopping your regular medicines. This is especially important if you are taking diabetes medicines or blood thinners. ? Taking medicines such as aspirin and ibuprofen. These medicines can thin your blood. Do not take these medicines before your procedure if your  health care provider instructs you not to.  Do not use any tobacco products, such as cigarettes, chewing tobacco, and e-cigarettes. If you need help quitting, ask your health care provider.  Plan to have someone take you home after the procedure.  You may have an exam or testing.  Ask your health care provider how your surgical site will be marked or identified.  You may be given antibiotic medicine to help prevent infection. What happens during the procedure?  To reduce your risk of infection: ? Your health care team will wash or sanitize their hands. ? Your skin will be washed with soap.  An IV tube will be inserted into one of your veins.  You may be given a medicine to help you relax (sedative).  You will be given a medicine to make you fall asleep (general anesthetic).  An incision will be made in your abdomen at the site of the stoma.  The large intestine will be disconnected from the abdomen at the site of the stoma.  The surgeon will use stitches (sutures) or staples to reconnect the two ends of the intestine that were separated during the end colostomy.  The incision will be closed with sutures, skin glue, or adhesive strips. It may be covered with bandages (dressings). The procedure may vary among health care providers and hospitals. What happens after the procedure?  Your blood pressure, heart rate, breathing rate, and blood oxygen level will be monitored often until the medicines you were given have worn off.  You will be given pain medicine as needed.  You will slowly increase your diet and movement as told by your health care provider.  Do not drive for 24 hours if you received a sedative. This information is not intended to replace advice given to you by your health care provider. Make sure you discuss any questions you have with your health care provider. Document Released: 02/01/2012 Document Revised: 04/16/2016 Document Reviewed: 07/23/2015 Elsevier  Interactive Patient Education  2018 [employer]  Colonoscopy, Adult A colonoscopy is an exam to look at the entire large intestine. During the exam, a lubricated, bendable tube is inserted into the anus and then passed into the rectum, colon, and other parts of the large intestine. A colonoscopy is often done as a part of normal colorectal screening or in response to certain symptoms, such as anemia, persistent diarrhea, abdominal pain, and blood in the stool. The exam can help screen for and diagnose medical problems, including:  Tumors.  Polyps.  Inflammation.  Areas of bleeding.  Tell a health care provider about:  Any allergies you have.  All medicines you are taking, including vitamins, herbs, eye drops, creams, and over-the-counter medicines.  Any problems you or family members have had with anesthetic medicines.  Any blood disorders you have.  Any surgeries you have had.  Any medical conditions you have.  Any problems you have had passing stool. What are the risks? Generally, this is a safe procedure. However, problems may occur, including:  Bleeding.  A tear in the intestine.  A reaction to medicines given during the exam.  Infection (rare).  What happens before the procedure? Eating and drinking restrictions Follow instructions from your health care provider about eating and drinking, which may include:  A few days before the procedure - follow a low-fiber diet. Avoid nuts, seeds, dried fruit, raw fruits, and vegetables.  1-3 days before the procedure - follow a clear liquid diet. Drink only clear liquids, such as clear broth or bouillon, black coffee or tea, clear juice, clear soft drinks or sports drinks, gelatin dessert, and popsicles. Avoid any liquids that contain red or purple dye.  On the day of the procedure - do not eat or drink anything during the 2 hours before the procedure, or within the time period that your health care provider  recommends.  Bowel prep If you were prescribed an oral bowel prep to clean out your colon:  Take it as told by your health care provider. Starting the day before your procedure, you will need to drink a large amount of medicated liquid. The liquid will cause you to have multiple loose stools until your stool is almost clear or light green.  If your skin or anus gets irritated from diarrhea, you may use these to relieve the irritation: ? Medicated wipes, such as adult wet wipes with aloe and vitamin E. ? A skin soothing-product like petroleum jelly.  If you vomit while drinking the bowel prep, take a break for up to 60 minutes and then begin the bowel prep again. If vomiting continues and you cannot take the bowel prep without vomiting, call your health care provider.  General instructions  Ask your health care provider about changing or stopping your regular medicines. This is especially important if you are taking diabetes medicines or blood thinners.  Plan to have someone take you home from the hospital or clinic. What happens during the procedure?  An IV tube may be inserted into one of your veins.  You will be given medicine to help you relax (sedative).  To reduce your risk of infection: ? Your health care team will wash  or sanitize their hands. ? Your anal area will be washed with soap.  You will be asked to lie on your side with your knees bent.  Your health care provider will lubricate a long, thin, flexible tube. The tube will have a camera and a light on the end.  The tube will be inserted into your anus.  The tube will be gently eased through your rectum and colon.  Air will be delivered into your colon to keep it open. You may feel some pressure or cramping.  The camera will be used to take images during the procedure.  A small tissue sample may be removed from your body to be examined under a microscope (biopsy). If any potential problems are found, the tissue  will be sent to a lab for testing.  If small polyps are found, your health care provider may remove them and have them checked for cancer cells.  The tube that was inserted into your anus will be slowly removed. The procedure may vary among health care providers and hospitals. What happens after the procedure?  Your blood pressure, heart rate, breathing rate, and blood oxygen level will be monitored until the medicines you were given have worn off.  Do not drive for 24 hours after the exam.  You may have a small amount of blood in your stool.  You may pass gas and have mild abdominal cramping or bloating due to the air that was used to inflate your colon during the exam.  It is up to you to get the results of your procedure. Ask your health care provider, or the department performing the procedure, when your results will be ready. This information is not intended to replace advice given to you by your health care provider. Make sure you discuss any questions you have with your health care provider. Document Released: 11/06/2000 Document Revised: 09/09/2016 Document Reviewed: 01/21/2016 Elsevier Interactive Patient Education  2018 [employer].

## 2018-10-11 ENCOUNTER — Telehealth (INDEPENDENT_AMBULATORY_CARE_PROVIDER_SITE_OTHER): Payer: Self-pay | Admitting: *Deleted

## 2018-10-11 ENCOUNTER — Encounter (INDEPENDENT_AMBULATORY_CARE_PROVIDER_SITE_OTHER): Payer: Self-pay | Admitting: Internal Medicine

## 2018-10-11 ENCOUNTER — Encounter (INDEPENDENT_AMBULATORY_CARE_PROVIDER_SITE_OTHER): Payer: Self-pay | Admitting: *Deleted

## 2018-10-11 ENCOUNTER — Ambulatory Visit (INDEPENDENT_AMBULATORY_CARE_PROVIDER_SITE_OTHER): Payer: Medicare Other | Admitting: Internal Medicine

## 2018-10-11 VITALS — BP 144/60 | HR 60 | Temp 98.2°F | Ht 63.0 in | Wt 132.6 lb

## 2018-10-11 DIAGNOSIS — K631 Perforation of intestine (nontraumatic): Secondary | ICD-10-CM

## 2018-10-11 DIAGNOSIS — Z1211 Encounter for screening for malignant neoplasm of colon: Secondary | ICD-10-CM | POA: Diagnosis not present

## 2018-10-11 MED ORDER — PEG 3350-KCL-NA BICARB-NACL 420 G PO SOLR: 4000.0000 mL | Freq: Once | ORAL | 0 refills | Status: AC

## 2018-10-11 NOTE — Patient Instructions (Signed)
The risks of bleeding, perforation and infection were reviewed with patient.  

## 2018-10-11 NOTE — Progress Notes (Signed)
   Subjective:    Patient ID: Michelle Wall, female    DOB: June 02, 1933, 84 y.o.   MRN: [id number]  HPI Referred by Dr. Henreitta LeberBridges for possible colonoscopy. Underwent a partial colectomy in July of this year by Dr. Henreitta LeberBridges for bowel perforation, free air, possible stercoral ulcer of sigmoid colon.  Procedure Exploratory lap, partial sigmoid colectomy and end colostomy). Patient has never undergone a colonoscopy in the past.  She is not having any GI problems. Appetite is good. Minimal weight loss since her surgery. Empties  Colostomy x 1 a day. No bright red blood in colostomy. She says she feels fine.     Review of Systems Past Medical History:  Diagnosis Date  . Carotid atherosclerosis   . Cor pulmonale (HCC)   . Emphysema   . Hypertension   . Hypothyroidism   . Pulmonary hypertension (HCC)    moderate  last 2D echo EF greater than 55%  mild to moderate tricuspid insufficiency  . Tricuspid insufficiency    moderate to severe    Past Surgical History:  Procedure Laterality Date  . COLOSTOMY Left 06/14/2018   Procedure: COLOSTOMY;  Surgeon: Lucretia RoersBridges, Lindsay C, MD;  Location: [facility];  Service: General;  Laterality: Left;  . PARTIAL COLECTOMY N/A 06/14/2018   Procedure: PARTIAL COLECTOMY;  Surgeon: Lucretia RoersBridges, Lindsay C, MD;  Location: [facility];  Service: General;  Laterality: N/A;  . THYROIDECTOMY, PARTIAL    . TUBAL LIGATION     midline scar ? possibly tubal ligation    No Known Allergies  Current Outpatient Medications on File Prior to Visit  Medication Sig Dispense Refill  . aspirin EC 81 MG tablet Take 81 mg by mouth daily.    Marland Kitchen. CALCIUM-VITAMIN D PO Take 1 tablet by mouth 2 (two) times daily.    Marland Kitchen. docusate sodium (COLACE) 100 MG capsule Take 1 capsule (100 mg total) by mouth 2 (two) times daily. 60 capsule 2  . hydrochlorothiazide (HYDRODIURIL) 12.5 MG tablet Take 25 mg by mouth daily.     Marland Kitchen. latanoprost (XALATAN) 0.005 % ophthalmic solution Place 1 drop into both eyes at  bedtime.    Marland Kitchen. SYNTHROID 100 MCG tablet TK 1 T PO Q MORNING.  3  . verapamil (CALAN-SR) 240 MG CR tablet Take 240 mg by mouth 2 (two) times daily.     No current facility-administered medications on file prior to visit.         Objective:   Physical Exam Blood pressure (!) 144/60, pulse 60, temperature 98.2 F (36.8 C), height 5\' 3"  (1.6 m), weight 132 lb 9.6 oz (60.1 kg). Alert and oriented. Skin warm and dry. Oral mucosa is moist. Upper and lower dentures. Sclera anicteric, conjunctivae is pink. Thyroid not enlarged. No cervical lymphadenopathy. Lungs clear. Heart regular rate and rhythm.  Abdomen is soft. Bowel sounds are positive. No hepatomegaly. No abdominal masses felt. No tenderness.  No edema to lower extremities. Patient is alert and oriented.         Assessment & Plan:  Recent hx of partial colectomy. Has never undergone a colonoscopy. Colonic neoplasm needs to be ruled out. Colonoscopy. The risks of bleeding, perforation and infection were reviewed with patient.

## 2018-10-11 NOTE — Telephone Encounter (Signed)
Patient needs trilyte 

## 2018-10-13 DIAGNOSIS — Z933 Colostomy status: Secondary | ICD-10-CM | POA: Diagnosis not present

## 2018-10-13 DIAGNOSIS — K56609 Unspecified intestinal obstruction, unspecified as to partial versus complete obstruction: Secondary | ICD-10-CM | POA: Diagnosis not present

## 2018-10-23 DIAGNOSIS — I272 Pulmonary hypertension, unspecified: Secondary | ICD-10-CM | POA: Diagnosis not present

## 2018-11-11 DIAGNOSIS — K56609 Unspecified intestinal obstruction, unspecified as to partial versus complete obstruction: Secondary | ICD-10-CM | POA: Diagnosis not present

## 2018-11-11 DIAGNOSIS — Z933 Colostomy status: Secondary | ICD-10-CM | POA: Diagnosis not present

## 2018-11-14 DIAGNOSIS — Z6824 Body mass index (BMI) 24.0-24.9, adult: Secondary | ICD-10-CM | POA: Diagnosis not present

## 2018-11-14 DIAGNOSIS — K119 Disease of salivary gland, unspecified: Secondary | ICD-10-CM | POA: Diagnosis not present

## 2018-11-14 DIAGNOSIS — Z129 Encounter for screening for malignant neoplasm, site unspecified: Secondary | ICD-10-CM | POA: Diagnosis not present

## 2018-11-14 DIAGNOSIS — I10 Essential (primary) hypertension: Secondary | ICD-10-CM | POA: Diagnosis not present

## 2018-11-14 DIAGNOSIS — Z720 Tobacco use: Secondary | ICD-10-CM | POA: Diagnosis not present

## 2018-11-23 DIAGNOSIS — I272 Pulmonary hypertension, unspecified: Secondary | ICD-10-CM | POA: Diagnosis not present

## 2018-12-07 DIAGNOSIS — H401132 Primary open-angle glaucoma, bilateral, moderate stage: Secondary | ICD-10-CM | POA: Diagnosis not present

## 2018-12-14 ENCOUNTER — Ambulatory Visit (HOSPITAL_COMMUNITY)
Admission: RE | Admit: 2018-12-14 | Discharge: 2018-12-14 | Disposition: A | Discharge status: Home / Self Care | Payer: Medicare Other | Attending: Internal Medicine | Admitting: Internal Medicine

## 2018-12-14 ENCOUNTER — Encounter (HOSPITAL_COMMUNITY): Payer: Self-pay | Admitting: *Deleted

## 2018-12-14 ENCOUNTER — Encounter (HOSPITAL_COMMUNITY)
Admission: RE | Disposition: A | Discharge status: Home / Self Care | Payer: Self-pay | Source: Home / Self Care | Attending: Internal Medicine

## 2018-12-14 ENCOUNTER — Other Ambulatory Visit: Payer: Self-pay | Admitting: General Surgery

## 2018-12-14 ENCOUNTER — Other Ambulatory Visit: Payer: Self-pay

## 2018-12-14 DIAGNOSIS — I10 Essential (primary) hypertension: Secondary | ICD-10-CM | POA: Diagnosis not present

## 2018-12-14 DIAGNOSIS — Z79899 Other long term (current) drug therapy: Secondary | ICD-10-CM | POA: Diagnosis not present

## 2018-12-14 DIAGNOSIS — K5730 Diverticulosis of large intestine without perforation or abscess without bleeding: Secondary | ICD-10-CM | POA: Insufficient documentation

## 2018-12-14 DIAGNOSIS — Z8719 Personal history of other diseases of the digestive system: Secondary | ICD-10-CM | POA: Diagnosis not present

## 2018-12-14 DIAGNOSIS — F17210 Nicotine dependence, cigarettes, uncomplicated: Secondary | ICD-10-CM | POA: Insufficient documentation

## 2018-12-14 DIAGNOSIS — Z7982 Long term (current) use of aspirin: Secondary | ICD-10-CM | POA: Diagnosis not present

## 2018-12-14 DIAGNOSIS — Z933 Colostomy status: Secondary | ICD-10-CM | POA: Insufficient documentation

## 2018-12-14 DIAGNOSIS — K621 Rectal polyp: Secondary | ICD-10-CM | POA: Diagnosis not present

## 2018-12-14 DIAGNOSIS — I272 Pulmonary hypertension, unspecified: Secondary | ICD-10-CM | POA: Insufficient documentation

## 2018-12-14 DIAGNOSIS — Z1211 Encounter for screening for malignant neoplasm of colon: Secondary | ICD-10-CM | POA: Insufficient documentation

## 2018-12-14 DIAGNOSIS — J439 Emphysema, unspecified: Secondary | ICD-10-CM | POA: Diagnosis not present

## 2018-12-14 DIAGNOSIS — K631 Perforation of intestine (nontraumatic): Secondary | ICD-10-CM | POA: Insufficient documentation

## 2018-12-14 DIAGNOSIS — K56609 Unspecified intestinal obstruction, unspecified as to partial versus complete obstruction: Secondary | ICD-10-CM | POA: Diagnosis not present

## 2018-12-14 DIAGNOSIS — E039 Hypothyroidism, unspecified: Secondary | ICD-10-CM | POA: Diagnosis not present

## 2018-12-14 HISTORY — PX: POLYPECTOMY: SHX5525

## 2018-12-14 HISTORY — PX: COLONOSCOPY: SHX5424

## 2018-12-14 SURGERY — COLONOSCOPY
Anesthesia: Moderate Sedation

## 2018-12-14 MED ORDER — MEPERIDINE HCL 50 MG/ML IJ SOLN
INTRAMUSCULAR | Status: DC | PRN
  Administered 2018-12-14: 25 mg via INTRAVENOUS
  Administered 2018-12-14: 15 mg via INTRAVENOUS

## 2018-12-14 MED ORDER — STERILE WATER FOR IRRIGATION IR SOLN
Status: DC | PRN
  Administered 2018-12-14: 09:00:00

## 2018-12-14 MED ORDER — MIDAZOLAM HCL 5 MG/5ML IJ SOLN
INTRAMUSCULAR | Status: DC | PRN
  Administered 2018-12-14 (×2): 1 mg via INTRAVENOUS

## 2018-12-14 MED ORDER — SODIUM CHLORIDE 0.9 % IV SOLN
INTRAVENOUS | Status: DC
  Administered 2018-12-14: 1000 mL via INTRAVENOUS

## 2018-12-14 MED ORDER — MEPERIDINE HCL 50 MG/ML IJ SOLN
INTRAMUSCULAR | Status: AC
  Filled 2018-12-14: qty 1

## 2018-12-14 MED ORDER — MIDAZOLAM HCL 5 MG/5ML IJ SOLN
INTRAMUSCULAR | Status: AC
  Filled 2018-12-14: qty 10

## 2018-12-14 NOTE — Discharge Instructions (Signed)
No aspirin or NSAIDs for 24 hours. Resume other medications as before. Resume usual diet. No driving for 24 hours. Patient will call with biopsy results.     Colonoscopy, Adult, Care After This sheet gives you information about how to care for yourself after your procedure. Your doctor may also give you more specific instructions. If you have problems or questions, call your doctor. What can I expect after the procedure? After the procedure, it is common to have:  A small amount of blood in your poop for 24 hours.  Some gas.  Mild cramping or bloating in your belly. Follow these instructions at home: General instructions  For the first 24 hours after the procedure: ? Do not drive or use machinery. ? Do not sign important documents. ? Do not drink alcohol. ? Do your daily activities more slowly than normal. ? Eat foods that are soft and easy to digest.  Take over-the-counter or prescription medicines only as told by your doctor. To help cramping and bloating:   Try walking around.  Put heat on your belly (abdomen) as told by your doctor. Use a heat source that your doctor recommends, such as a moist heat pack or a heating pad. ? Put a towel between your skin and the heat source. ? Leave the heat on for 20-30 minutes. ? Remove the heat if your skin turns bright red. This is especially important if you cannot feel pain, heat, or cold. You can get burned. Eating and drinking   Drink enough fluid to keep your pee (urine) clear or pale yellow.  Return to your normal diet as told by your doctor. Avoid heavy or fried foods that are hard to digest.  Avoid drinking alcohol for as long as told by your doctor. Contact a doctor if:  You have blood in your poop (stool) 2-3 days after the procedure. Get help right away if:  You have more than a small amount of blood in your poop.  You see large clumps of tissue (blood clots) in your poop.  Your belly is swollen.  You feel  sick to your stomach (nauseous).  You throw up (vomit).  You have a fever.  You have belly pain that gets worse, and medicine does not help your pain. Summary  After the procedure, it is common to have a small amount of blood in your poop. You may also have mild cramping and bloating in your belly.  For the first 24 hours after the procedure, do not drive or use machinery, do not sign important documents, and do not drink alcohol.  Get help right away if you have a lot of blood in your poop, feel sick to your stomach, have a fever, or have more belly pain. This information is not intended to replace advice given to you by your health care provider. Make sure you discuss any questions you have with your health care provider. Document Released: 12/12/2010 Document Revised: 09/09/2017 Document Reviewed: 08/03/2016 Elsevier Interactive Patient Education  2019 [employer].     Diverticulosis  Diverticulosis is a condition that develops when small pouches (diverticula) form in the wall of the large intestine (colon). The colon is where water is absorbed and stool is formed. The pouches form when the inside layer of the colon pushes through weak spots in the outer layers of the colon. You may have a few pouches or many of them. What are the causes? The cause of this condition is not known. What increases the  risk? The following factors may make you more likely to develop this condition:  Being older than age 83. Your risk for this condition increases with age. Diverticulosis is rare among people younger than age 83. By age 83, many people have it.  Eating a low-fiber diet.  Having frequent constipation.  Being overweight.  Not getting enough exercise.  Smoking.  Taking over-the-counter pain medicines, like aspirin and ibuprofen.  Having a family history of diverticulosis. What are the signs or symptoms? In most people, there are no symptoms of this condition. If you do have  symptoms, they may include:  Bloating.  Cramps in the abdomen.  Constipation or diarrhea.  Pain in the lower left side of the abdomen. How is this diagnosed? This condition is most often diagnosed during an exam for other colon problems. Because diverticulosis usually has no symptoms, it often cannot be diagnosed independently. This condition may be diagnosed by:  Using a flexible scope to examine the colon (colonoscopy).  Taking an X-ray of the colon after dye has been put into the colon (barium enema).  Doing a CT scan. How is this treated? You may not need treatment for this condition if you have never developed an infection related to diverticulosis. If you have had an infection before, treatment may include:  Eating a high-fiber diet. This may include eating more fruits, vegetables, and grains.  Taking a fiber supplement.  Taking a live bacteria supplement (probiotic).  Taking medicine to relax your colon.  Taking antibiotic medicines. Follow these instructions at home:  Drink 6-8 glasses of water or more each day to prevent constipation.  Try not to strain when you have a bowel movement.  If you have had an infection before: ? Eat more fiber as directed by your health care provider or your diet and nutrition specialist (dietitian). ? Take a fiber supplement or probiotic, if your health care provider approves.  Take over-the-counter and prescription medicines only as told by your health care provider.  If you were prescribed an antibiotic, take it as told by your health care provider. Do not stop taking the antibiotic even if you start to feel better.  Keep all follow-up visits as told by your health care provider. This is important. Contact a health care provider if:  You have pain in your abdomen.  You have bloating.  You have cramps.  You have not had a bowel movement in 3 days. Get help right away if:  Your pain gets worse.  Your bloating becomes very  bad.  You have a fever or chills, and your symptoms suddenly get worse.  You vomit.  You have bowel movements that are bloody or black.  You have bleeding from your rectum. Summary  Diverticulosis is a condition that develops when small pouches (diverticula) form in the wall of the large intestine (colon).  You may have a few pouches or many of them.  This condition is most often diagnosed during an exam for other colon problems.  If you have had an infection related to diverticulosis, treatment may include increasing the fiber in your diet, taking supplements, or taking medicines. This information is not intended to replace advice given to you by your health care provider. Make sure you discuss any questions you have with your health care provider. Document Released: 08/06/2004 Document Revised: 09/28/2016 Document Reviewed: 09/28/2016 Elsevier Interactive Patient Education  2019 [employer] Inc[employer]   Colon Polyps  Polyps are tissue growths inside the body. Polyps can grow in  many places, including the large intestine (colon). A polyp may be a round bump or a mushroom-shaped growth. You could have one polyp or several. Most colon polyps are noncancerous (benign). However, some colon polyps can become cancerous over time. Finding and removing the polyps early can help prevent this. What are the causes? The exact cause of colon polyps is not known. What increases the risk? You are more likely to develop this condition if you:  Have a family history of colon cancer or colon polyps.  Are older than 6850 or older than 45 if you are African American.  Have inflammatory bowel disease, such as ulcerative colitis or Crohn's disease.  Have certain hereditary conditions, such as: ? Familial adenomatous polyposis. ? Lynch syndrome. ? Turcot syndrome. ? Peutz-Jeghers syndrome.  Are overweight.  Smoke cigarettes.  Do not get enough exercise.  Drink too much alcohol.  Eat a diet that  is high in fat and red meat and low in fiber.  Had childhood cancer that was treated with abdominal radiation. What are the signs or symptoms? Most polyps do not cause symptoms. If you have symptoms, they may include:  Blood coming from your rectum when having a bowel movement.  Blood in your stool. The stool may look dark red or black.  Abdominal pain.  A change in bowel habits, such as constipation or diarrhea. How is this diagnosed? This condition is diagnosed with a colonoscopy. This is a procedure in which a lighted, flexible scope is inserted into the anus and then passed into the colon to examine the area. Polyps are sometimes found when a colonoscopy is done as part of routine cancer screening tests. How is this treated? Treatment for this condition involves removing any polyps that are found. Most polyps can be removed during a colonoscopy. Those polyps will then be tested for cancer. Additional treatment may be needed depending on the results of testing. Follow these instructions at home: Lifestyle  Maintain a healthy weight, or lose weight if recommended by your health care provider.  Exercise every day or as told by your health care provider.  Do not use any products that contain nicotine or tobacco, such as cigarettes and e-cigarettes. If you need help quitting, ask your health care provider.  If you drink alcohol, limit how much you have: ? 0-1 drink a day for women. ? 0-2 drinks a day for men.  Be aware of how much alcohol is in your drink. In the [place] one drink equals one 12 oz bottle of beer (355 mL), one 5 oz glass of wine (148 mL), or one 1 oz shot of hard liquor (44 mL). Eating and drinking   Eat foods that are high in fiber, such as fruits, vegetables, and whole grains.  Eat foods that are high in calcium and vitamin D, such as milk, cheese, yogurt, eggs, liver, fish, and broccoli.  Limit foods that are high in fat, such as fried foods and  desserts.  Limit the amount of red meat and processed meat you eat, such as hot dogs, sausage, bacon, and lunch meats. General instructions  Keep all follow-up visits as told by your health care provider. This is important. ? This includes having regularly scheduled colonoscopies. ? Talk to your health care provider about when you need a colonoscopy. Contact a health care provider if:  You have new or worsening bleeding during a bowel movement.  You have new or increased blood in your stool.  You have a change  in bowel habits.  You lose weight for no known reason. Summary  Polyps are tissue growths inside the body. Polyps can grow in many places, including the colon.  Most colon polyps are noncancerous (benign), but some can become cancerous over time.  This condition is diagnosed with a colonoscopy.  Treatment for this condition involves removing any polyps that are found. Most polyps can be removed during a colonoscopy. This information is not intended to replace advice given to you by your health care provider. Make sure you discuss any questions you have with your health care provider. Document Released: 08/05/2004 Document Revised: 02/24/2018 Document Reviewed: 02/24/2018 Elsevier Interactive Patient Education  2019 [employer].

## 2018-12-14 NOTE — H&P (Signed)
Michelle Wall is an 85 y.o. female.   Chief Complaint: Patient is here for colonoscopy. HPI: Patient is 85 year old Afro-American female who underwent emergency surgery in July 2019 for sigmoid diverticulitis with perforation.  She had colostomy.  She has not decided if she wants to have better or worse.  She has never undergone screening for CRC.  She she denies rectal bleeding or bleeding into colostomy.  She also denies abdominal pain.  Family history is negative for CRC. Last aspirin dose was 2 days ago.  Past Medical History:  Diagnosis Date  . Carotid atherosclerosis   . Cor pulmonale (HCC)   . Emphysema   . Hypertension   . Hypothyroidism   . Pulmonary hypertension (HCC)    moderate  last 2D echo EF greater than 55%  mild to moderate tricuspid insufficiency  . Tricuspid insufficiency    moderate to severe    Past Surgical History:  Procedure Laterality Date  . COLOSTOMY Left 06/14/2018   Procedure: COLOSTOMY;  Surgeon: Lucretia Roers, MD;  Location: [facility];  Service: General;  Laterality: Left;  . PARTIAL COLECTOMY N/A 06/14/2018   Procedure: PARTIAL COLECTOMY;  Surgeon: Lucretia Roers, MD;  Location: [facility];  Service: General;  Laterality: N/A;  . THYROIDECTOMY, PARTIAL    . TUBAL LIGATION     midline scar ? possibly tubal ligation    Family History  Problem Relation Age of Onset  . Cancer Other    Social History:  reports that she has been smoking cigarettes. She has a 60.00 pack-year smoking history. She has never used smokeless tobacco. She reports that she does not drink alcohol or use drugs.  Allergies: No Known Allergies  Medications Prior to Admission  Medication Sig Dispense Refill  . aspirin EC 81 MG tablet Take 81 mg by mouth daily.    . cholecalciferol (VITAMIN D3) 25 MCG (1000 UT) tablet Take 1,000 Units by mouth daily.    Marland Kitchen docusate sodium (COLACE) 100 MG capsule Take 1 capsule (100 mg total) by mouth 2 (two) times daily. 60 capsule 2  .  hydrochlorothiazide (HYDRODIURIL) 25 MG tablet Take 25 mg by mouth daily.     Marland Kitchen latanoprost (XALATAN) 0.005 % ophthalmic solution Place 1 drop into both eyes at bedtime.    Marland Kitchen SYNTHROID 100 MCG tablet Take 100 mcg by mouth daily before breakfast.   3  . verapamil (CALAN-SR) 240 MG CR tablet Take 240 mg by mouth daily.       No results found for this or any previous visit (from the past 48 hour(s)). No results found.  ROS  Blood pressure (!) 164/63, pulse 66, temperature 97.6 F (36.4 C), temperature source Oral, resp. rate (!) 23, height 5\' 3"  (1.6 m), weight 59 kg, SpO2 98 %. Physical Exam  Constitutional:  Well-developed thin African-American female in NAD.  HENT:  Mouth/Throat: Oropharynx is clear and moist.  Eyes: Conjunctivae are normal. No scleral icterus.  Neck: No thyromegaly present.  Cardiovascular: Normal rate, regular rhythm and normal heart sounds.  No murmur heard. Respiratory: Effort normal and breath sounds normal.  GI:  Ezzard Standing is flat.  She has well-healed midline scar.  Colostomy is located in left lower quadrant.  Abdomen is soft and nontender with organomegaly or masses.  Musculoskeletal:        General: No edema.  Lymphadenopathy:    She has no cervical adenopathy.  Neurological: She is alert.  Skin: Skin is warm and dry.  Assessment/Plan Average risk screening colonoscopy. Patient has colostomy resulting from diverticulitis with perforation.  Lionel December, MD 12/14/2018, 8:30 AM

## 2018-12-14 NOTE — Op Note (Signed)
[facility] Patient Name: Michelle Wall Procedure Date: 12/14/2018 7:59 AM MRN: [id number] Date of Birth: 12-Dec-1932 Attending MD: Lionel December , MD CSN: [id number] Age: 85 Admit Type: Inpatient Procedure:                Colonoscopy Indications:              Screening for colorectal malignant neoplasm;                            patient has sigmoid colostomy. Providers:                Lionel December, MD, Sterling Big, RN, Criselda Peaches.                            Patsy Lager, RN, Burke Keels, Technician Referring MD:             Leatrice Jewels. Henreitta Leber, MD and Mirna Mires, MD(PCP) Medicines:                Meperidine 40 mg IV, Midazolam 2 mg IV Complications:            No immediate complications. Estimated Blood Loss:     Estimated blood loss was minimal. Procedure:                Pre-Anesthesia Assessment:                           - Prior to the procedure, a History and Physical                            was performed, and patient medications and                            allergies were reviewed. The patient's tolerance of                            previous anesthesia was also reviewed. The risks                            and benefits of the procedure and the sedation                            options and risks were discussed with the patient.                            All questions were answered, and informed consent                            was obtained. Prior Anticoagulants: The patient                            last took aspirin 2 days prior to the procedure.                            ASA Grade Assessment: III - A patient with severe  systemic disease. After reviewing the risks and                            benefits, the patient was deemed in satisfactory                            condition to undergo the procedure.                           After obtaining informed consent, the colonoscope                            was passed under direct  vision. Throughout the                            procedure, the patient's blood pressure, pulse, and                            oxygen saturations were monitored continuously. The                            [id number]-[id number] ([id number] scope was introduced through                            the sigmoid colostomy and advanced to the the                            cecum, identified by appendiceal orifice and                            ileocecal valve. The colonoscopy was performed                            without difficulty. The patient tolerated the                            procedure well. The quality of the bowel                            preparation was excellent. The ileocecal valve, the                            appendiceal orifice and the rectum were                            photographed. Scope In: 8:39:24 AM Scope Out: 8:56:23 AM Scope Withdrawal Time: 0 hours 11 minutes 15 seconds  Total Procedure Duration: 0 hours 16 minutes 59 seconds  Findings:      A few diverticula were found in the proximal sigmoid colon and hepatic       flexure.      The exam was otherwise normal throughout the examined colon.      The perianal examination was normal.      The digital rectal exam findings include large amount of dry stool in  the rectum.      A diminutive polyp was found in the rectum. The polyp was sessile.       Biopsies were taken with a cold forceps for histology. Blunt obscured by       stool. Impression:               - Diverticulosis in the proximal sigmoid colon and                            at the hepatic flexure.                           - Large amount of dry stool in the rectum found on                            digital rectal exam.                           - One diminutive polyp in the rectum. Biopsied. Moderate Sedation:      Moderate (conscious) sedation was administered by the endoscopy nurse       and supervised by the endoscopist. The following parameters were        monitored: oxygen saturation, heart rate, blood pressure, CO2       capnography and response to care. Total physician intraservice time was       21 minutes. Recommendation:           - Patient has a contact number available for                            emergencies. The signs and symptoms of potential                            delayed complications were discussed with the                            patient. Return to normal activities tomorrow.                            Written discharge instructions were provided to the                            patient.                           - Resume previous diet today.                           - Continue present medications.                           - Use fleet enema to rid rectum odfstool.                           - No aspirin, ibuprofen, naproxen, or other  non-steroidal anti-inflammatory drugs for 1 day.                           - Await pathology results.                           - No repeat colonoscopy due to age. Procedure Code(s):        --- Professional ---                           [phone], Colonoscopy through stoma; with biopsy,                            single or multiple                           G0500, Moderate sedation services provided by the                            same physician or other qualified health care                            professional performing a gastrointestinal                            endoscopic service that sedation supports,                            requiring the presence of an independent trained                            observer to assist in the monitoring of the                            patient's level of consciousness and physiological                            status; initial 15 minutes of intra-service time;                            patient age 41 years or older (additional time may                            be reported with [id number], as  appropriate) Diagnosis Code(s):        --- Professional ---                           Z12.11, Encounter for screening for malignant                            neoplasm of colon                           K62.1, Rectal polyp  K57.30, Diverticulosis of large intestine without                            perforation or abscess without bleeding CPT copyright 2018 American Medical Association. All rights reserved. The codes documented in this report are preliminary and upon coder review may  be revised to meet current compliance requirements. Lionel December, MD Lionel December, MD 12/14/2018 9:12:03 AM This report has been signed electronically. Number of Addenda: 0

## 2018-12-16 ENCOUNTER — Encounter (HOSPITAL_COMMUNITY): Payer: Self-pay | Admitting: Internal Medicine

## 2018-12-24 DIAGNOSIS — I272 Pulmonary hypertension, unspecified: Secondary | ICD-10-CM | POA: Diagnosis not present

## 2019-01-02 ENCOUNTER — Other Ambulatory Visit: Payer: Self-pay | Admitting: General Surgery

## 2019-01-02 DIAGNOSIS — Z933 Colostomy status: Secondary | ICD-10-CM

## 2019-01-16 DIAGNOSIS — Z933 Colostomy status: Secondary | ICD-10-CM | POA: Diagnosis not present

## 2019-01-16 DIAGNOSIS — K56609 Unspecified intestinal obstruction, unspecified as to partial versus complete obstruction: Secondary | ICD-10-CM | POA: Diagnosis not present

## 2019-01-17 ENCOUNTER — Other Ambulatory Visit: Payer: Self-pay | Admitting: Emergency Medicine

## 2019-01-17 DIAGNOSIS — Z933 Colostomy status: Secondary | ICD-10-CM

## 2019-01-17 MED ORDER — DOCUSATE SODIUM 100 MG PO CAPS: 100.0000 mg | ORAL_CAPSULE | Freq: Two times a day (BID) | ORAL | 2 refills | Status: DC

## 2019-01-22 DIAGNOSIS — J449 Chronic obstructive pulmonary disease, unspecified: Secondary | ICD-10-CM | POA: Diagnosis not present

## 2019-01-22 DIAGNOSIS — I272 Pulmonary hypertension, unspecified: Secondary | ICD-10-CM | POA: Diagnosis not present

## 2019-01-31 DIAGNOSIS — Z933 Colostomy status: Secondary | ICD-10-CM | POA: Diagnosis not present

## 2019-01-31 DIAGNOSIS — K56609 Unspecified intestinal obstruction, unspecified as to partial versus complete obstruction: Secondary | ICD-10-CM | POA: Diagnosis not present

## 2019-02-09 DIAGNOSIS — I10 Essential (primary) hypertension: Secondary | ICD-10-CM | POA: Diagnosis not present

## 2019-02-09 DIAGNOSIS — E890 Postprocedural hypothyroidism: Secondary | ICD-10-CM | POA: Diagnosis not present

## 2019-02-09 DIAGNOSIS — E063 Autoimmune thyroiditis: Secondary | ICD-10-CM | POA: Diagnosis not present

## 2019-02-13 DIAGNOSIS — Z720 Tobacco use: Secondary | ICD-10-CM | POA: Diagnosis not present

## 2019-02-13 DIAGNOSIS — I10 Essential (primary) hypertension: Secondary | ICD-10-CM | POA: Diagnosis not present

## 2019-02-13 DIAGNOSIS — Z933 Colostomy status: Secondary | ICD-10-CM | POA: Diagnosis not present

## 2019-02-13 DIAGNOSIS — E039 Hypothyroidism, unspecified: Secondary | ICD-10-CM | POA: Diagnosis not present

## 2019-02-16 DIAGNOSIS — J449 Chronic obstructive pulmonary disease, unspecified: Secondary | ICD-10-CM | POA: Diagnosis not present

## 2019-02-16 DIAGNOSIS — I10 Essential (primary) hypertension: Secondary | ICD-10-CM | POA: Diagnosis not present

## 2019-02-16 DIAGNOSIS — E042 Nontoxic multinodular goiter: Secondary | ICD-10-CM | POA: Diagnosis not present

## 2019-02-16 DIAGNOSIS — E785 Hyperlipidemia, unspecified: Secondary | ICD-10-CM | POA: Diagnosis not present

## 2019-02-16 DIAGNOSIS — E890 Postprocedural hypothyroidism: Secondary | ICD-10-CM | POA: Diagnosis not present

## 2019-02-16 DIAGNOSIS — E063 Autoimmune thyroiditis: Secondary | ICD-10-CM | POA: Diagnosis not present

## 2019-02-22 DIAGNOSIS — J449 Chronic obstructive pulmonary disease, unspecified: Secondary | ICD-10-CM | POA: Diagnosis not present

## 2019-02-22 DIAGNOSIS — I272 Pulmonary hypertension, unspecified: Secondary | ICD-10-CM | POA: Diagnosis not present

## 2019-03-02 DIAGNOSIS — Z933 Colostomy status: Secondary | ICD-10-CM | POA: Diagnosis not present

## 2019-03-02 DIAGNOSIS — K56609 Unspecified intestinal obstruction, unspecified as to partial versus complete obstruction: Secondary | ICD-10-CM | POA: Diagnosis not present

## 2019-03-24 DIAGNOSIS — J449 Chronic obstructive pulmonary disease, unspecified: Secondary | ICD-10-CM | POA: Diagnosis not present

## 2019-03-24 DIAGNOSIS — I272 Pulmonary hypertension, unspecified: Secondary | ICD-10-CM | POA: Diagnosis not present

## 2019-03-31 DIAGNOSIS — Z933 Colostomy status: Secondary | ICD-10-CM | POA: Diagnosis not present

## 2019-03-31 DIAGNOSIS — K56609 Unspecified intestinal obstruction, unspecified as to partial versus complete obstruction: Secondary | ICD-10-CM | POA: Diagnosis not present

## 2019-04-24 DIAGNOSIS — J449 Chronic obstructive pulmonary disease, unspecified: Secondary | ICD-10-CM | POA: Diagnosis not present

## 2019-04-24 DIAGNOSIS — I272 Pulmonary hypertension, unspecified: Secondary | ICD-10-CM | POA: Diagnosis not present

## 2019-05-03 DIAGNOSIS — K56609 Unspecified intestinal obstruction, unspecified as to partial versus complete obstruction: Secondary | ICD-10-CM | POA: Diagnosis not present

## 2019-05-03 DIAGNOSIS — Z933 Colostomy status: Secondary | ICD-10-CM | POA: Diagnosis not present

## 2019-05-22 DIAGNOSIS — Z7189 Other specified counseling: Secondary | ICD-10-CM | POA: Diagnosis not present

## 2019-05-22 DIAGNOSIS — E039 Hypothyroidism, unspecified: Secondary | ICD-10-CM | POA: Diagnosis not present

## 2019-05-22 DIAGNOSIS — E785 Hyperlipidemia, unspecified: Secondary | ICD-10-CM | POA: Diagnosis not present

## 2019-05-22 DIAGNOSIS — I10 Essential (primary) hypertension: Secondary | ICD-10-CM | POA: Diagnosis not present

## 2019-05-24 DIAGNOSIS — I272 Pulmonary hypertension, unspecified: Secondary | ICD-10-CM | POA: Diagnosis not present

## 2019-05-24 DIAGNOSIS — J449 Chronic obstructive pulmonary disease, unspecified: Secondary | ICD-10-CM | POA: Diagnosis not present

## 2019-06-01 DIAGNOSIS — Z933 Colostomy status: Secondary | ICD-10-CM | POA: Diagnosis not present

## 2019-06-01 DIAGNOSIS — K56609 Unspecified intestinal obstruction, unspecified as to partial versus complete obstruction: Secondary | ICD-10-CM | POA: Diagnosis not present

## 2019-06-22 DIAGNOSIS — H401132 Primary open-angle glaucoma, bilateral, moderate stage: Secondary | ICD-10-CM | POA: Diagnosis not present

## 2019-06-24 DIAGNOSIS — I272 Pulmonary hypertension, unspecified: Secondary | ICD-10-CM | POA: Diagnosis not present

## 2019-06-24 DIAGNOSIS — J449 Chronic obstructive pulmonary disease, unspecified: Secondary | ICD-10-CM | POA: Diagnosis not present

## 2019-06-30 ENCOUNTER — Other Ambulatory Visit: Payer: Self-pay | Admitting: General Surgery

## 2019-06-30 DIAGNOSIS — Z933 Colostomy status: Secondary | ICD-10-CM

## 2019-07-04 DIAGNOSIS — Z933 Colostomy status: Secondary | ICD-10-CM | POA: Diagnosis not present

## 2019-07-04 DIAGNOSIS — K56609 Unspecified intestinal obstruction, unspecified as to partial versus complete obstruction: Secondary | ICD-10-CM | POA: Diagnosis not present

## 2019-07-05 ENCOUNTER — Other Ambulatory Visit: Payer: Self-pay

## 2019-07-05 NOTE — Telephone Encounter (Signed)
Call received to refill patient medication. Patient advised to contact PCP for refill. Patient states understanding.

## 2019-07-25 DIAGNOSIS — J449 Chronic obstructive pulmonary disease, unspecified: Secondary | ICD-10-CM | POA: Diagnosis not present

## 2019-07-25 DIAGNOSIS — I272 Pulmonary hypertension, unspecified: Secondary | ICD-10-CM | POA: Diagnosis not present

## 2019-08-07 DIAGNOSIS — Z933 Colostomy status: Secondary | ICD-10-CM | POA: Diagnosis not present

## 2019-08-07 DIAGNOSIS — K56609 Unspecified intestinal obstruction, unspecified as to partial versus complete obstruction: Secondary | ICD-10-CM | POA: Diagnosis not present

## 2019-08-22 DIAGNOSIS — Z720 Tobacco use: Secondary | ICD-10-CM | POA: Diagnosis not present

## 2019-08-22 DIAGNOSIS — I10 Essential (primary) hypertension: Secondary | ICD-10-CM | POA: Diagnosis not present

## 2019-08-22 DIAGNOSIS — E785 Hyperlipidemia, unspecified: Secondary | ICD-10-CM | POA: Diagnosis not present

## 2019-08-24 DIAGNOSIS — I272 Pulmonary hypertension, unspecified: Secondary | ICD-10-CM | POA: Diagnosis not present

## 2019-08-24 DIAGNOSIS — J449 Chronic obstructive pulmonary disease, unspecified: Secondary | ICD-10-CM | POA: Diagnosis not present

## 2019-09-06 DIAGNOSIS — K56609 Unspecified intestinal obstruction, unspecified as to partial versus complete obstruction: Secondary | ICD-10-CM | POA: Diagnosis not present

## 2019-09-06 DIAGNOSIS — Z933 Colostomy status: Secondary | ICD-10-CM | POA: Diagnosis not present

## 2019-09-24 DIAGNOSIS — J449 Chronic obstructive pulmonary disease, unspecified: Secondary | ICD-10-CM | POA: Diagnosis not present

## 2019-09-24 DIAGNOSIS — I272 Pulmonary hypertension, unspecified: Secondary | ICD-10-CM | POA: Diagnosis not present

## 2019-10-05 DIAGNOSIS — Z933 Colostomy status: Secondary | ICD-10-CM | POA: Diagnosis not present

## 2019-10-05 DIAGNOSIS — K56609 Unspecified intestinal obstruction, unspecified as to partial versus complete obstruction: Secondary | ICD-10-CM | POA: Diagnosis not present

## 2019-10-24 DIAGNOSIS — I272 Pulmonary hypertension, unspecified: Secondary | ICD-10-CM | POA: Diagnosis not present

## 2019-10-24 DIAGNOSIS — J449 Chronic obstructive pulmonary disease, unspecified: Secondary | ICD-10-CM | POA: Diagnosis not present

## 2019-11-04 DIAGNOSIS — Z933 Colostomy status: Secondary | ICD-10-CM | POA: Diagnosis not present

## 2019-11-04 DIAGNOSIS — K56609 Unspecified intestinal obstruction, unspecified as to partial versus complete obstruction: Secondary | ICD-10-CM | POA: Diagnosis not present

## 2019-11-29 ENCOUNTER — Other Ambulatory Visit: Payer: Self-pay

## 2019-11-29 NOTE — Patient Outreach (Signed)
[facility]) Care Management  11/29/2019  Michelle Wall 16-Oct-1933 [id number]  Medication Adherence call to Michelle Wall [employer] Ver[employer] spoke with patient she is past due on Atorvastatin 20 mg,patient explain she takes 1 tablet daily,patient ask to call [facility] to order this medication,pharmacy will have it ready for patient to pick. Michelle Wall is showing past due under [facility] Ins.  Lillia Abed CPhT Pharmacy Technician [facility] Management Direct Dial [phone]  Fax [phone] [email or web address]

## 2019-12-11 DIAGNOSIS — H401112 Primary open-angle glaucoma, right eye, moderate stage: Secondary | ICD-10-CM | POA: Diagnosis not present

## 2019-12-25 DIAGNOSIS — I272 Pulmonary hypertension, unspecified: Secondary | ICD-10-CM | POA: Diagnosis not present

## 2019-12-25 DIAGNOSIS — J449 Chronic obstructive pulmonary disease, unspecified: Secondary | ICD-10-CM | POA: Diagnosis not present

## 2019-12-27 DIAGNOSIS — K56609 Unspecified intestinal obstruction, unspecified as to partial versus complete obstruction: Secondary | ICD-10-CM | POA: Diagnosis not present

## 2019-12-27 DIAGNOSIS — Z933 Colostomy status: Secondary | ICD-10-CM | POA: Diagnosis not present

## 2020-01-01 DIAGNOSIS — I10 Essential (primary) hypertension: Secondary | ICD-10-CM | POA: Diagnosis not present

## 2020-01-01 DIAGNOSIS — E785 Hyperlipidemia, unspecified: Secondary | ICD-10-CM | POA: Diagnosis not present

## 2020-01-01 DIAGNOSIS — Z720 Tobacco use: Secondary | ICD-10-CM | POA: Diagnosis not present

## 2020-01-21 DIAGNOSIS — E785 Hyperlipidemia, unspecified: Secondary | ICD-10-CM | POA: Diagnosis not present

## 2020-01-21 DIAGNOSIS — E039 Hypothyroidism, unspecified: Secondary | ICD-10-CM | POA: Diagnosis not present

## 2020-01-21 DIAGNOSIS — I10 Essential (primary) hypertension: Secondary | ICD-10-CM | POA: Diagnosis not present

## 2020-01-22 DIAGNOSIS — I272 Pulmonary hypertension, unspecified: Secondary | ICD-10-CM | POA: Diagnosis not present

## 2020-01-22 DIAGNOSIS — J449 Chronic obstructive pulmonary disease, unspecified: Secondary | ICD-10-CM | POA: Diagnosis not present

## 2020-01-23 DIAGNOSIS — K56609 Unspecified intestinal obstruction, unspecified as to partial versus complete obstruction: Secondary | ICD-10-CM | POA: Diagnosis not present

## 2020-01-23 DIAGNOSIS — Z933 Colostomy status: Secondary | ICD-10-CM | POA: Diagnosis not present

## 2020-01-25 IMAGING — CT CT ABD-PELV W/ CM
2 of 5 series · 14 of 46 positions shown, 16 images · IV contrast (Isovue)
Comparison: None.

CLINICAL DATA: 84-year-old with acute abdominal pain, generalized.

EXAM:
CT ABDOMEN AND PELVIS WITH CONTRAST
TECHNIQUE: Multidetector CT imaging of the abdomen and pelvis was performed
using the standard protocol following bolus administration of
intravenous contrast.
CONTRAST:  100mL [id number]-REE IOPAMIDOL (Y248OU-REE) INJECTION 61%

[Series 2: axial st · axial · 0.63mm/px · z∈[+727,+1127]mm · 11 of 90 slices shown, 13 images]
[im 5/90  soft-tissue]
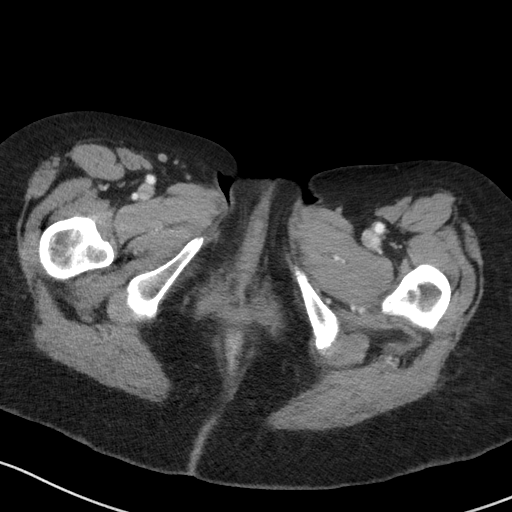
[im 5/90  bone]
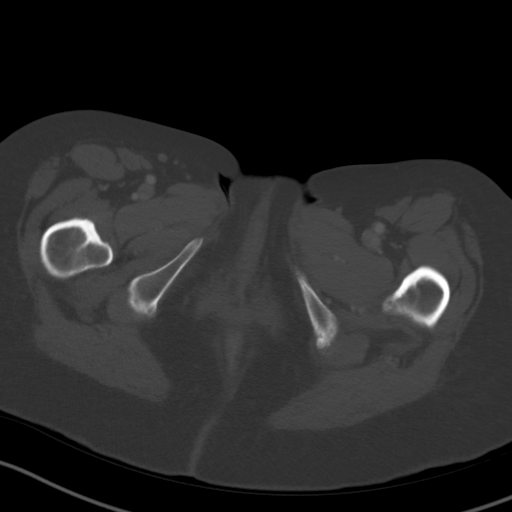
[im 15/90  soft-tissue]
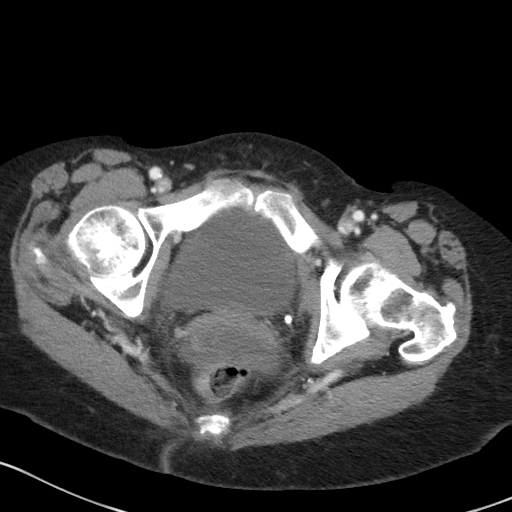
[im 20/90  soft-tissue]
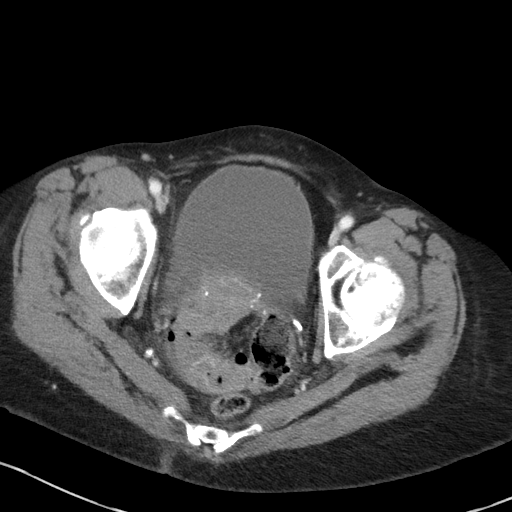
[im 30/90  soft-tissue]
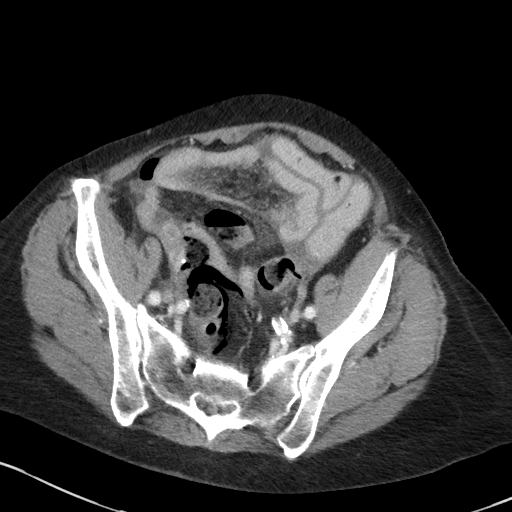
[im 35/90  soft-tissue]
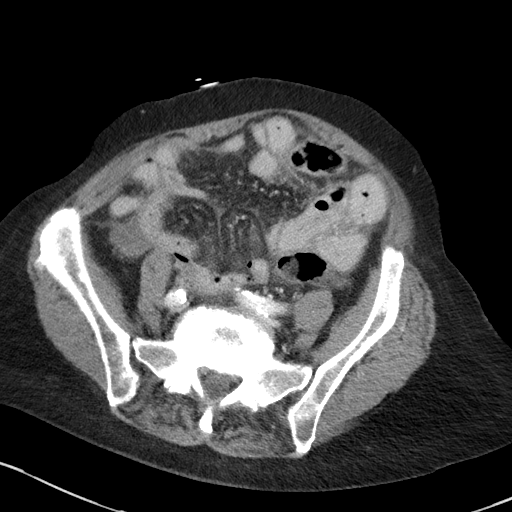
[im 45/90  soft-tissue]
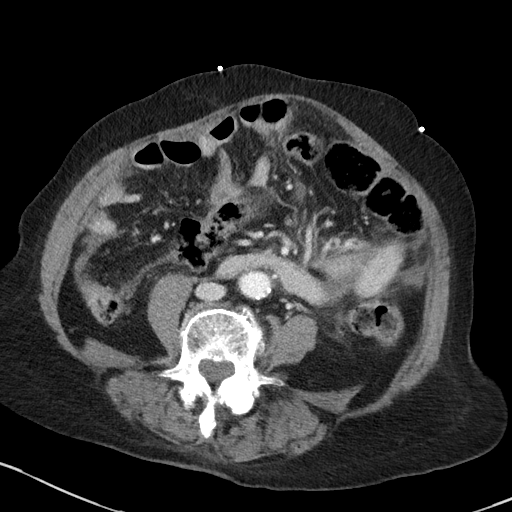
[im 55/90  soft-tissue]
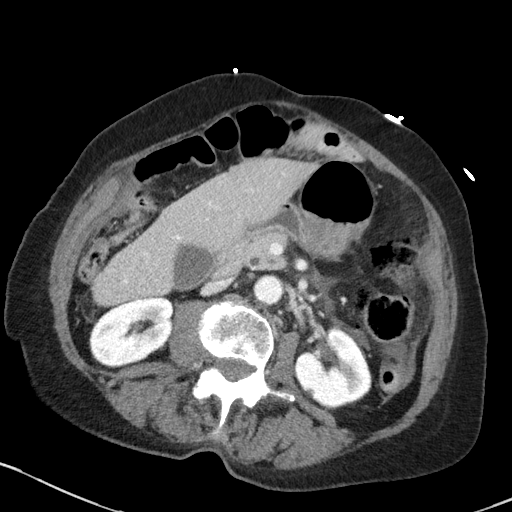
[im 60/90  soft-tissue]
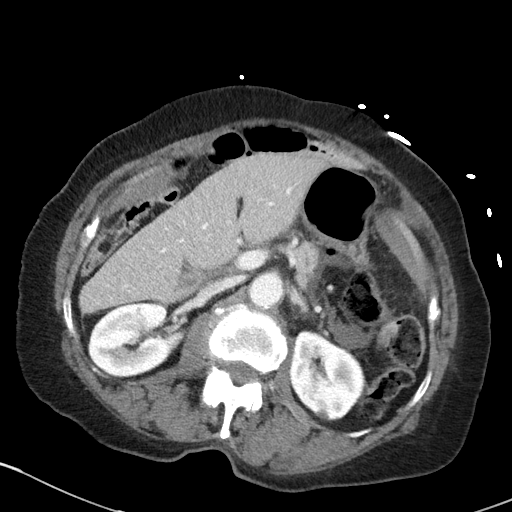
[im 70/90  soft-tissue]
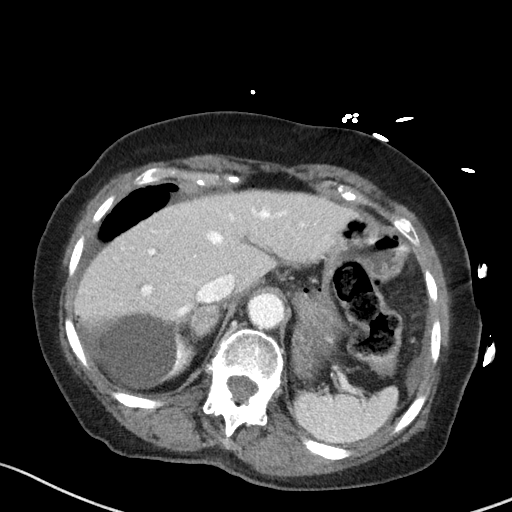
[im 70/90  bone]
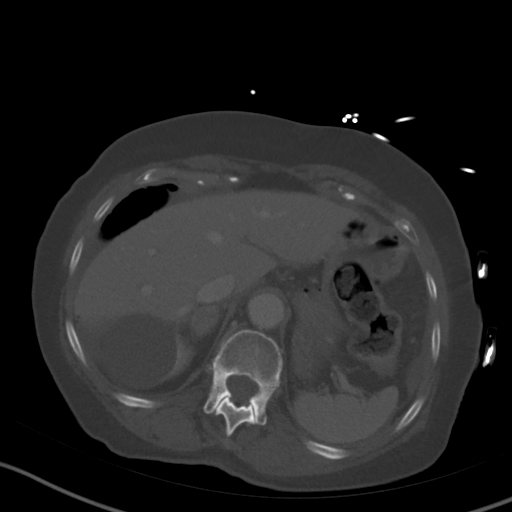
[im 75/90  soft-tissue]
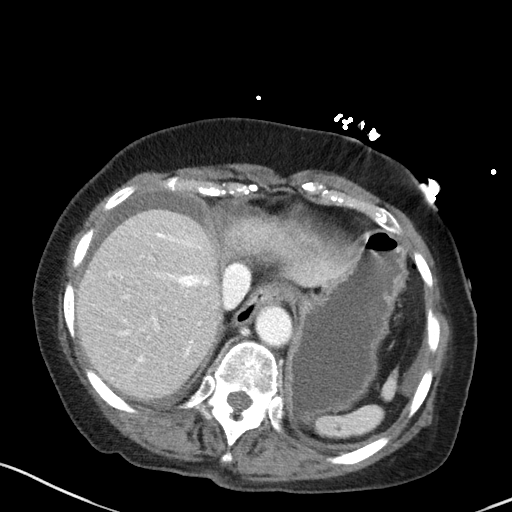
[im 85/90  soft-tissue]
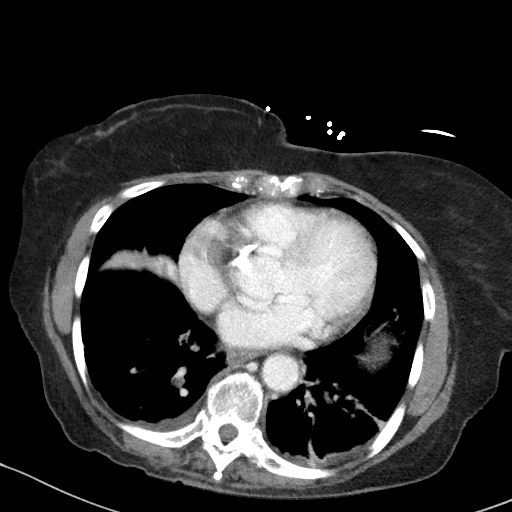

[Series 4: coronal st · coronal · 0.65mm/px · 3 of 89 slices shown]
[im 30/89  soft-tissue]
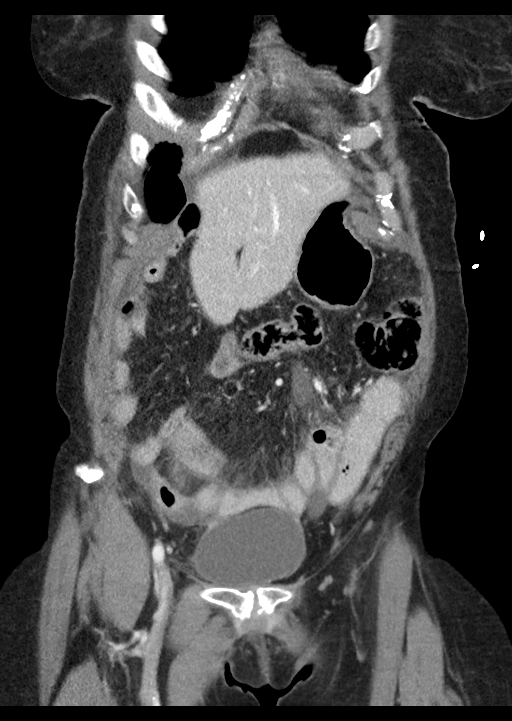
[im 40/89  soft-tissue]
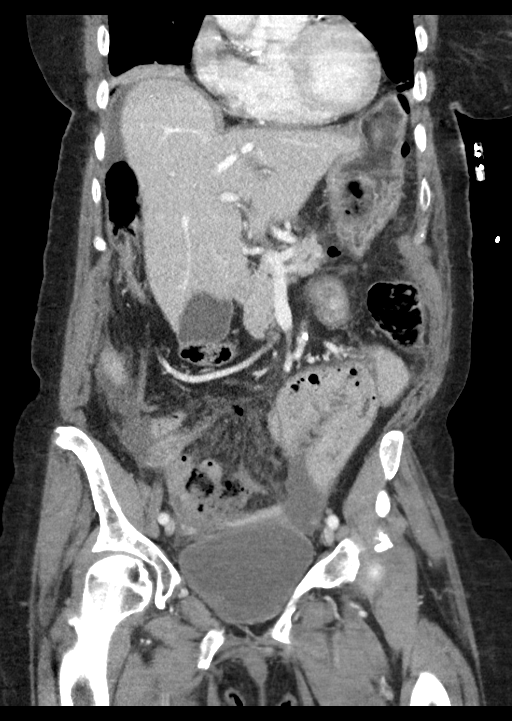
[im 49/89  soft-tissue]
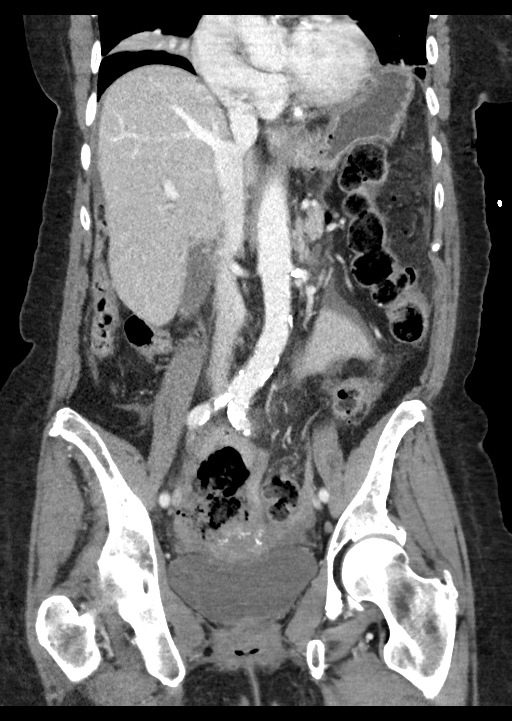

[14 of 46 positions shown; findings below may reference images not displayed]

FINDINGS: Lower chest: Filling defects in the right lower lobe airways that
could represent aspiration or mucous plugging. There is atelectasis
in both lower lobes. There appears to be volume loss in the right
middle lobe. Coronary artery calcifications.

Hepatobiliary: Perihepatic ascites. There is free air within the
perihepatic ascites. Gallbladder is unremarkable. Poorly defined
low-density structure in the right hepatic lobe measures 1.1 cm and
nonspecific. Portal venous system is patent.

Pancreas: Unremarkable. No pancreatic ductal dilatation or
surrounding inflammatory changes.

Spleen: Normal in size without focal abnormality.

Adrenals/Urinary Tract: 2.5 cm nodule in the right adrenal gland is
indeterminate. Large cyst involving the right kidney upper pole
measures up to 5 cm. Scattered hypodensities in both kidneys
probably represent additional renal cysts. Some of these
hypodensities are indeterminate and too small to definitively
characterize. No hydronephrosis. Urinary bladder is unremarkable.

Stomach/Bowel: Evidence for extraluminal gas in the right upper
abdomen adjacent to the liver. In addition, there appears to be
extraluminal gas and extraluminal stool in the right lower abdomen
and pelvic region. Findings are suggestive for a perforation
involving the sigmoid colon. This area of perforation is probably on
sequence 2, image 61. The hepatic flexure is anterior to the liver.
There appears to be a normal appendix in the right upper abdomen.
Free fluid in the central mesentery on sequence 2 image 53.
Mesenteric edema and a small amount of mesenteric fluid.

Vascular/Lymphatic: There is a calcified aneurysm that appears to be
involving the proximal splenic artery measuring up to 1.1 cm.
Atherosclerotic disease involving the aorta and visceral arteries
without aortic aneurysm. The origin of the left renal artery is
heavily calcified. No lymph node enlargement in the abdomen or
pelvis.

Reproductive: Small uterus with myometrial calcifications. No
evidence for an adnexal mass but limited evaluation due to the bowel
pathology in this area.

Other: Small amount of free fluid in the abdomen.

Musculoskeletal: Multilevel degenerative disease in lumbar spine
with mild scoliosis.
IMPRESSION: Perforated viscus. Evidence for a bowel perforation involving the
sigmoid colon. There is free air and extraluminal stool associated
with this perforated bowel.

Small amount of ascites in the abdomen.

Volume loss and mucous plugging in the right lower lobe.

Indeterminate 2.5 cm right adrenal nodule. This may be an incidental
finding unless the patient has a history of malignancy. A dedicated
adrenal CT could be performed after patient's acute medical problems
have been dealt with.

Splenic artery aneurysm measuring 1.1 cm.

These results were called by telephone at the time of interpretation
on 06/14/2018 at [DATE] to Dr. Kaasu , who verbally acknowledged
these results.

## 2020-01-25 IMAGING — CR DG CHEST 1V PORT
1 series · 1 of 1 positions shown · non-contrast
Comparison: 06/14/2018

CLINICAL DATA: Central line placement

EXAM:
PORTABLE CHEST 1 VIEW

[portable]
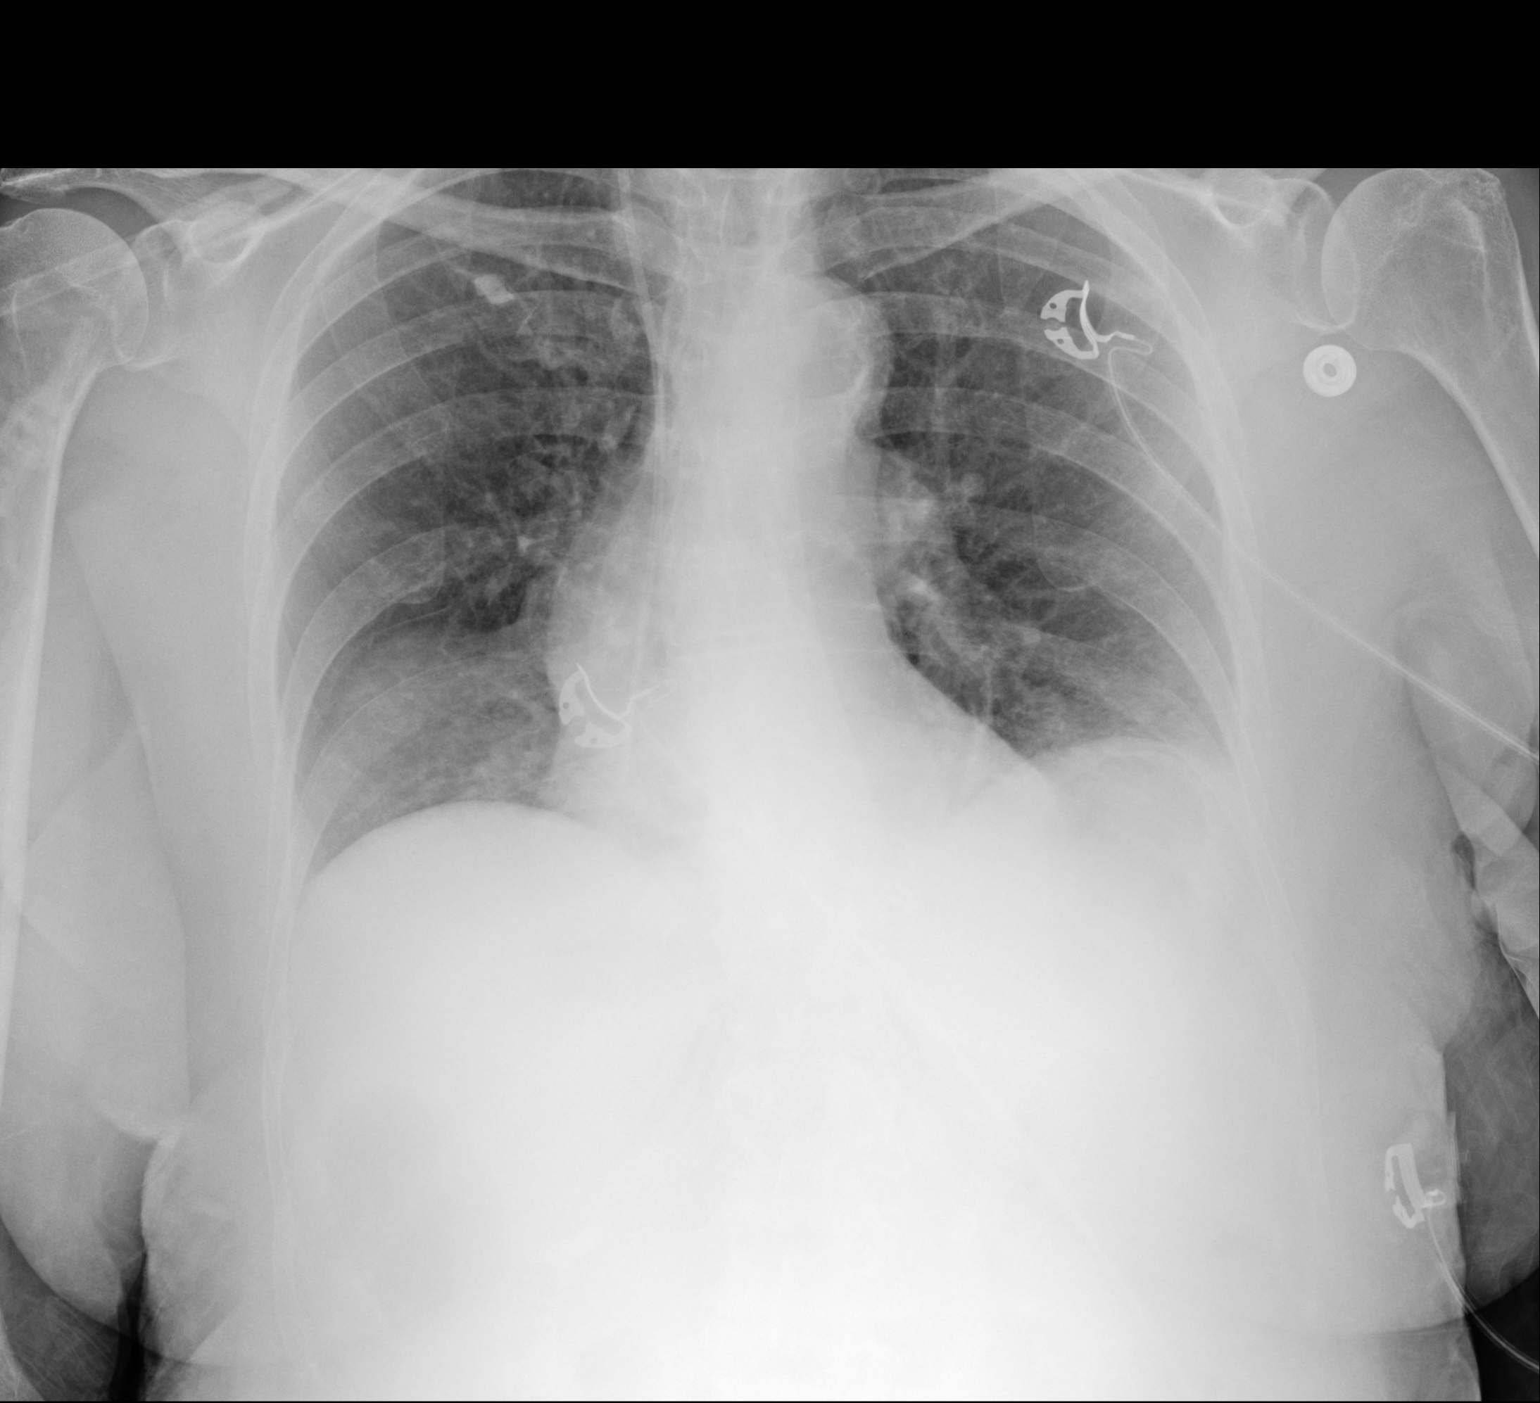

[1 of 1 positions shown; findings below may reference images not displayed]

FINDINGS: Cardiac shadow is within normal limits. New right jugular central
line is noted extending just below the cavoatrial junction. No
pneumothorax is seen. Calcified granuloma in the right apex is
noted. No focal infiltrate or sizable effusion is seen.
IMPRESSION: No acute abnormality noted following central line placement.

## 2020-02-22 DIAGNOSIS — J449 Chronic obstructive pulmonary disease, unspecified: Secondary | ICD-10-CM | POA: Diagnosis not present

## 2020-02-22 DIAGNOSIS — I272 Pulmonary hypertension, unspecified: Secondary | ICD-10-CM | POA: Diagnosis not present

## 2020-02-23 DIAGNOSIS — Z933 Colostomy status: Secondary | ICD-10-CM | POA: Diagnosis not present

## 2020-02-23 DIAGNOSIS — K56609 Unspecified intestinal obstruction, unspecified as to partial versus complete obstruction: Secondary | ICD-10-CM | POA: Diagnosis not present

## 2020-02-28 DIAGNOSIS — I10 Essential (primary) hypertension: Secondary | ICD-10-CM | POA: Diagnosis not present

## 2020-02-28 DIAGNOSIS — J449 Chronic obstructive pulmonary disease, unspecified: Secondary | ICD-10-CM | POA: Diagnosis not present

## 2020-02-28 DIAGNOSIS — E890 Postprocedural hypothyroidism: Secondary | ICD-10-CM | POA: Diagnosis not present

## 2020-02-28 DIAGNOSIS — E063 Autoimmune thyroiditis: Secondary | ICD-10-CM | POA: Diagnosis not present

## 2020-03-06 DIAGNOSIS — E890 Postprocedural hypothyroidism: Secondary | ICD-10-CM | POA: Diagnosis not present

## 2020-03-06 DIAGNOSIS — J449 Chronic obstructive pulmonary disease, unspecified: Secondary | ICD-10-CM | POA: Diagnosis not present

## 2020-03-06 DIAGNOSIS — I10 Essential (primary) hypertension: Secondary | ICD-10-CM | POA: Diagnosis not present

## 2020-03-06 DIAGNOSIS — E785 Hyperlipidemia, unspecified: Secondary | ICD-10-CM | POA: Diagnosis not present

## 2020-03-06 DIAGNOSIS — E042 Nontoxic multinodular goiter: Secondary | ICD-10-CM | POA: Diagnosis not present

## 2020-03-06 DIAGNOSIS — E063 Autoimmune thyroiditis: Secondary | ICD-10-CM | POA: Diagnosis not present

## 2020-03-06 DIAGNOSIS — F17210 Nicotine dependence, cigarettes, uncomplicated: Secondary | ICD-10-CM | POA: Diagnosis not present

## 2020-03-22 DIAGNOSIS — E785 Hyperlipidemia, unspecified: Secondary | ICD-10-CM | POA: Diagnosis not present

## 2020-03-22 DIAGNOSIS — I10 Essential (primary) hypertension: Secondary | ICD-10-CM | POA: Diagnosis not present

## 2020-03-22 DIAGNOSIS — E039 Hypothyroidism, unspecified: Secondary | ICD-10-CM | POA: Diagnosis not present

## 2020-03-23 DIAGNOSIS — I272 Pulmonary hypertension, unspecified: Secondary | ICD-10-CM | POA: Diagnosis not present

## 2020-03-23 DIAGNOSIS — J449 Chronic obstructive pulmonary disease, unspecified: Secondary | ICD-10-CM | POA: Diagnosis not present

## 2020-03-25 DIAGNOSIS — Z933 Colostomy status: Secondary | ICD-10-CM | POA: Diagnosis not present

## 2020-03-25 DIAGNOSIS — K56609 Unspecified intestinal obstruction, unspecified as to partial versus complete obstruction: Secondary | ICD-10-CM | POA: Diagnosis not present

## 2020-04-23 DIAGNOSIS — J449 Chronic obstructive pulmonary disease, unspecified: Secondary | ICD-10-CM | POA: Diagnosis not present

## 2020-04-23 DIAGNOSIS — I272 Pulmonary hypertension, unspecified: Secondary | ICD-10-CM | POA: Diagnosis not present

## 2020-04-25 DIAGNOSIS — K56609 Unspecified intestinal obstruction, unspecified as to partial versus complete obstruction: Secondary | ICD-10-CM | POA: Diagnosis not present

## 2020-04-25 DIAGNOSIS — Z933 Colostomy status: Secondary | ICD-10-CM | POA: Diagnosis not present

## 2020-04-30 DIAGNOSIS — Z720 Tobacco use: Secondary | ICD-10-CM | POA: Diagnosis not present

## 2020-04-30 DIAGNOSIS — E785 Hyperlipidemia, unspecified: Secondary | ICD-10-CM | POA: Diagnosis not present

## 2020-04-30 DIAGNOSIS — I10 Essential (primary) hypertension: Secondary | ICD-10-CM | POA: Diagnosis not present

## 2020-05-23 DIAGNOSIS — I272 Pulmonary hypertension, unspecified: Secondary | ICD-10-CM | POA: Diagnosis not present

## 2020-05-23 DIAGNOSIS — J449 Chronic obstructive pulmonary disease, unspecified: Secondary | ICD-10-CM | POA: Diagnosis not present

## 2020-05-24 DIAGNOSIS — K56609 Unspecified intestinal obstruction, unspecified as to partial versus complete obstruction: Secondary | ICD-10-CM | POA: Diagnosis not present

## 2020-05-24 DIAGNOSIS — Z933 Colostomy status: Secondary | ICD-10-CM | POA: Diagnosis not present

## 2020-06-10 DIAGNOSIS — H401131 Primary open-angle glaucoma, bilateral, mild stage: Secondary | ICD-10-CM | POA: Diagnosis not present

## 2020-06-23 DIAGNOSIS — I272 Pulmonary hypertension, unspecified: Secondary | ICD-10-CM | POA: Diagnosis not present

## 2020-06-23 DIAGNOSIS — J449 Chronic obstructive pulmonary disease, unspecified: Secondary | ICD-10-CM | POA: Diagnosis not present

## 2020-06-25 DIAGNOSIS — K56609 Unspecified intestinal obstruction, unspecified as to partial versus complete obstruction: Secondary | ICD-10-CM | POA: Diagnosis not present

## 2020-06-25 DIAGNOSIS — Z933 Colostomy status: Secondary | ICD-10-CM | POA: Diagnosis not present

## 2020-07-24 DIAGNOSIS — J449 Chronic obstructive pulmonary disease, unspecified: Secondary | ICD-10-CM | POA: Diagnosis not present

## 2020-07-24 DIAGNOSIS — I272 Pulmonary hypertension, unspecified: Secondary | ICD-10-CM | POA: Diagnosis not present

## 2020-07-29 DIAGNOSIS — K56609 Unspecified intestinal obstruction, unspecified as to partial versus complete obstruction: Secondary | ICD-10-CM | POA: Diagnosis not present

## 2020-07-29 DIAGNOSIS — Z933 Colostomy status: Secondary | ICD-10-CM | POA: Diagnosis not present

## 2020-08-08 ENCOUNTER — Inpatient Hospital Stay (HOSPITAL_COMMUNITY)
Admission: EM | Admit: 2020-08-08 | Discharge: 2020-08-19 | DRG: 336 | Disposition: A | Discharge status: Home Health | Payer: Medicare HMO | Attending: Family Medicine | Admitting: Family Medicine

## 2020-08-08 ENCOUNTER — Other Ambulatory Visit: Payer: Self-pay

## 2020-08-08 ENCOUNTER — Encounter (HOSPITAL_COMMUNITY): Payer: Self-pay | Admitting: *Deleted

## 2020-08-08 DIAGNOSIS — E039 Hypothyroidism, unspecified: Secondary | ICD-10-CM | POA: Diagnosis present

## 2020-08-08 DIAGNOSIS — Z4659 Encounter for fitting and adjustment of other gastrointestinal appliance and device: Secondary | ICD-10-CM

## 2020-08-08 DIAGNOSIS — J9811 Atelectasis: Secondary | ICD-10-CM | POA: Diagnosis not present

## 2020-08-08 DIAGNOSIS — Z933 Colostomy status: Secondary | ICD-10-CM

## 2020-08-08 DIAGNOSIS — I2781 Cor pulmonale (chronic): Secondary | ICD-10-CM | POA: Diagnosis not present

## 2020-08-08 DIAGNOSIS — K435 Parastomal hernia without obstruction or  gangrene: Secondary | ICD-10-CM | POA: Diagnosis not present

## 2020-08-08 DIAGNOSIS — K660 Peritoneal adhesions (postprocedural) (postinfection): Secondary | ICD-10-CM | POA: Diagnosis present

## 2020-08-08 DIAGNOSIS — J439 Emphysema, unspecified: Secondary | ICD-10-CM | POA: Diagnosis present

## 2020-08-08 DIAGNOSIS — I340 Nonrheumatic mitral (valve) insufficiency: Secondary | ICD-10-CM | POA: Diagnosis not present

## 2020-08-08 DIAGNOSIS — Z9049 Acquired absence of other specified parts of digestive tract: Secondary | ICD-10-CM | POA: Diagnosis not present

## 2020-08-08 DIAGNOSIS — R109 Unspecified abdominal pain: Secondary | ICD-10-CM | POA: Diagnosis not present

## 2020-08-08 DIAGNOSIS — I071 Rheumatic tricuspid insufficiency: Secondary | ICD-10-CM | POA: Diagnosis present

## 2020-08-08 DIAGNOSIS — I10 Essential (primary) hypertension: Secondary | ICD-10-CM | POA: Diagnosis present

## 2020-08-08 DIAGNOSIS — J9611 Chronic respiratory failure with hypoxia: Secondary | ICD-10-CM | POA: Diagnosis not present

## 2020-08-08 DIAGNOSIS — I4891 Unspecified atrial fibrillation: Secondary | ICD-10-CM | POA: Diagnosis not present

## 2020-08-08 DIAGNOSIS — E876 Hypokalemia: Secondary | ICD-10-CM | POA: Diagnosis present

## 2020-08-08 DIAGNOSIS — Z7982 Long term (current) use of aspirin: Secondary | ICD-10-CM | POA: Diagnosis not present

## 2020-08-08 DIAGNOSIS — Z79890 Hormone replacement therapy (postmenopausal): Secondary | ICD-10-CM | POA: Diagnosis not present

## 2020-08-08 DIAGNOSIS — Z978 Presence of other specified devices: Secondary | ICD-10-CM

## 2020-08-08 DIAGNOSIS — K562 Volvulus: Secondary | ICD-10-CM | POA: Diagnosis not present

## 2020-08-08 DIAGNOSIS — I2729 Other secondary pulmonary hypertension: Secondary | ICD-10-CM | POA: Diagnosis not present

## 2020-08-08 DIAGNOSIS — R188 Other ascites: Secondary | ICD-10-CM | POA: Diagnosis not present

## 2020-08-08 DIAGNOSIS — Z20822 Contact with and (suspected) exposure to covid-19: Secondary | ICD-10-CM | POA: Diagnosis not present

## 2020-08-08 DIAGNOSIS — Z720 Tobacco use: Secondary | ICD-10-CM | POA: Diagnosis present

## 2020-08-08 DIAGNOSIS — Z23 Encounter for immunization: Secondary | ICD-10-CM

## 2020-08-08 DIAGNOSIS — Z9851 Tubal ligation status: Secondary | ICD-10-CM | POA: Diagnosis not present

## 2020-08-08 DIAGNOSIS — I700 Atherosclerosis of aorta: Secondary | ICD-10-CM | POA: Diagnosis not present

## 2020-08-08 DIAGNOSIS — R0602 Shortness of breath: Secondary | ICD-10-CM | POA: Diagnosis not present

## 2020-08-08 DIAGNOSIS — D62 Acute posthemorrhagic anemia: Secondary | ICD-10-CM | POA: Diagnosis not present

## 2020-08-08 DIAGNOSIS — N281 Cyst of kidney, acquired: Secondary | ICD-10-CM | POA: Diagnosis not present

## 2020-08-08 DIAGNOSIS — K5939 Other megacolon: Secondary | ICD-10-CM | POA: Diagnosis not present

## 2020-08-08 DIAGNOSIS — K56609 Unspecified intestinal obstruction, unspecified as to partial versus complete obstruction: Secondary | ICD-10-CM | POA: Diagnosis present

## 2020-08-08 DIAGNOSIS — Z79899 Other long term (current) drug therapy: Secondary | ICD-10-CM | POA: Diagnosis not present

## 2020-08-08 DIAGNOSIS — R739 Hyperglycemia, unspecified: Secondary | ICD-10-CM | POA: Diagnosis present

## 2020-08-08 DIAGNOSIS — Z86010 Personal history of colonic polyps: Secondary | ICD-10-CM

## 2020-08-08 DIAGNOSIS — R911 Solitary pulmonary nodule: Secondary | ICD-10-CM | POA: Diagnosis not present

## 2020-08-08 DIAGNOSIS — R918 Other nonspecific abnormal finding of lung field: Secondary | ICD-10-CM | POA: Diagnosis not present

## 2020-08-08 DIAGNOSIS — F17210 Nicotine dependence, cigarettes, uncomplicated: Secondary | ICD-10-CM | POA: Diagnosis not present

## 2020-08-08 DIAGNOSIS — I471 Supraventricular tachycardia: Secondary | ICD-10-CM | POA: Diagnosis not present

## 2020-08-08 DIAGNOSIS — J90 Pleural effusion, not elsewhere classified: Secondary | ICD-10-CM | POA: Diagnosis not present

## 2020-08-08 DIAGNOSIS — F17200 Nicotine dependence, unspecified, uncomplicated: Secondary | ICD-10-CM | POA: Diagnosis not present

## 2020-08-08 DIAGNOSIS — I350 Nonrheumatic aortic (valve) stenosis: Secondary | ICD-10-CM | POA: Diagnosis not present

## 2020-08-08 DIAGNOSIS — Z4682 Encounter for fitting and adjustment of non-vascular catheter: Secondary | ICD-10-CM | POA: Diagnosis not present

## 2020-08-08 DIAGNOSIS — R410 Disorientation, unspecified: Secondary | ICD-10-CM | POA: Diagnosis not present

## 2020-08-08 DIAGNOSIS — R1110 Vomiting, unspecified: Secondary | ICD-10-CM | POA: Diagnosis not present

## 2020-08-08 DIAGNOSIS — I361 Nonrheumatic tricuspid (valve) insufficiency: Secondary | ICD-10-CM | POA: Diagnosis not present

## 2020-08-08 DIAGNOSIS — Z452 Encounter for adjustment and management of vascular access device: Secondary | ICD-10-CM | POA: Diagnosis not present

## 2020-08-08 DIAGNOSIS — J449 Chronic obstructive pulmonary disease, unspecified: Secondary | ICD-10-CM | POA: Diagnosis present

## 2020-08-08 DIAGNOSIS — K567 Ileus, unspecified: Secondary | ICD-10-CM | POA: Diagnosis not present

## 2020-08-08 LAB — COMPREHENSIVE METABOLIC PANEL
ALT: 13 U/L (ref 0–44)
AST: 15 U/L (ref 15–41)
Albumin: 4.6 g/dL (ref 3.5–5.0)
Alkaline Phosphatase: 35 U/L — ABNORMAL LOW (ref 38–126)
Anion gap: 14 (ref 5–15)
BUN: 14 mg/dL (ref 8–23)
CO2: 27 mmol/L (ref 22–32)
Calcium: 9.7 mg/dL (ref 8.9–10.3)
Chloride: 95 mmol/L — ABNORMAL LOW (ref 98–111)
Creatinine, Ser: 0.77 mg/dL (ref 0.44–1.00)
GFR calc Af Amer: >60 mL/min (ref >60)
GFR calc non Af Amer: >60 mL/min (ref >60)
Glucose, Bld: 170 mg/dL — ABNORMAL HIGH (ref 70–99)
Potassium: 2.8 mmol/L — ABNORMAL LOW (ref 3.5–5.1)
Sodium: 136 mmol/L (ref 135–145)
Total Bilirubin: 0.7 mg/dL (ref 0.3–1.2)
Total Protein: 8.7 g/dL — ABNORMAL HIGH (ref 6.5–8.1)

## 2020-08-08 LAB — CBC
HCT: 46.6 % — ABNORMAL HIGH (ref 36.0–46.0)
Hemoglobin: 15 g/dL (ref 12.0–15.0)
MCH: 31.4 pg (ref 26.0–34.0)
MCHC: 32.2 g/dL (ref 30.0–36.0)
MCV: 97.5 fL (ref 80.0–100.0)
Platelets: 218 10*3/uL (ref 150–400)
RBC: 4.78 MIL/uL (ref 3.87–5.11)
RDW: 13.7 % (ref 11.5–15.5)
WBC: 9.3 10*3/uL (ref 4.0–10.5)
nRBC: 0 % (ref 0.0–0.2)

## 2020-08-08 LAB — LIPASE, BLOOD: Lipase: 34 U/L (ref 11–51)

## 2020-08-08 MED ORDER — SODIUM CHLORIDE 0.9 % IV BOLUS
500.0000 mL | Freq: Once | INTRAVENOUS | Status: AC
  Administered 2020-08-09: 500 mL via INTRAVENOUS

## 2020-08-08 MED ORDER — ONDANSETRON HCL 4 MG/2ML IJ SOLN
4.0000 mg | Freq: Once | INTRAMUSCULAR | Status: AC
  Administered 2020-08-09: 4 mg via INTRAVENOUS
  Filled 2020-08-08: qty 2

## 2020-08-08 MED ORDER — FENTANYL CITRATE (PF) 100 MCG/2ML IJ SOLN
50.0000 ug | Freq: Once | INTRAMUSCULAR | Status: AC
  Administered 2020-08-09: 50 ug via INTRAVENOUS
  Filled 2020-08-08: qty 2

## 2020-08-08 NOTE — ED Triage Notes (Signed)
Pt with upper abd pain across since 1630 today, emesis x1 when she arrived here.  Pt has  Colostomy.

## 2020-08-09 ENCOUNTER — Inpatient Hospital Stay (HOSPITAL_COMMUNITY): Payer: Medicare HMO | Admitting: Anesthesiology

## 2020-08-09 ENCOUNTER — Encounter (HOSPITAL_COMMUNITY)
Admission: EM | Disposition: A | Discharge status: Home Health | Payer: Self-pay | Source: Home / Self Care | Attending: Internal Medicine

## 2020-08-09 ENCOUNTER — Emergency Department (HOSPITAL_COMMUNITY): Payer: Medicare HMO

## 2020-08-09 DIAGNOSIS — I4891 Unspecified atrial fibrillation: Secondary | ICD-10-CM | POA: Diagnosis not present

## 2020-08-09 DIAGNOSIS — I700 Atherosclerosis of aorta: Secondary | ICD-10-CM | POA: Diagnosis not present

## 2020-08-09 DIAGNOSIS — K435 Parastomal hernia without obstruction or  gangrene: Secondary | ICD-10-CM | POA: Diagnosis not present

## 2020-08-09 DIAGNOSIS — K562 Volvulus: Secondary | ICD-10-CM | POA: Diagnosis present

## 2020-08-09 DIAGNOSIS — I071 Rheumatic tricuspid insufficiency: Secondary | ICD-10-CM | POA: Diagnosis present

## 2020-08-09 DIAGNOSIS — E039 Hypothyroidism, unspecified: Secondary | ICD-10-CM | POA: Diagnosis present

## 2020-08-09 DIAGNOSIS — R188 Other ascites: Secondary | ICD-10-CM | POA: Diagnosis not present

## 2020-08-09 DIAGNOSIS — I10 Essential (primary) hypertension: Secondary | ICD-10-CM | POA: Diagnosis present

## 2020-08-09 DIAGNOSIS — Z4682 Encounter for fitting and adjustment of non-vascular catheter: Secondary | ICD-10-CM | POA: Diagnosis not present

## 2020-08-09 DIAGNOSIS — F17210 Nicotine dependence, cigarettes, uncomplicated: Secondary | ICD-10-CM | POA: Diagnosis present

## 2020-08-09 DIAGNOSIS — R1110 Vomiting, unspecified: Secondary | ICD-10-CM | POA: Diagnosis not present

## 2020-08-09 DIAGNOSIS — K56609 Unspecified intestinal obstruction, unspecified as to partial versus complete obstruction: Secondary | ICD-10-CM | POA: Diagnosis present

## 2020-08-09 DIAGNOSIS — Z86010 Personal history of colonic polyps: Secondary | ICD-10-CM | POA: Diagnosis not present

## 2020-08-09 DIAGNOSIS — R739 Hyperglycemia, unspecified: Secondary | ICD-10-CM | POA: Diagnosis present

## 2020-08-09 DIAGNOSIS — I2729 Other secondary pulmonary hypertension: Secondary | ICD-10-CM | POA: Diagnosis present

## 2020-08-09 DIAGNOSIS — Z933 Colostomy status: Secondary | ICD-10-CM | POA: Diagnosis not present

## 2020-08-09 DIAGNOSIS — Z79890 Hormone replacement therapy (postmenopausal): Secondary | ICD-10-CM | POA: Diagnosis not present

## 2020-08-09 DIAGNOSIS — R911 Solitary pulmonary nodule: Secondary | ICD-10-CM | POA: Diagnosis not present

## 2020-08-09 DIAGNOSIS — J439 Emphysema, unspecified: Secondary | ICD-10-CM | POA: Diagnosis present

## 2020-08-09 DIAGNOSIS — F17200 Nicotine dependence, unspecified, uncomplicated: Secondary | ICD-10-CM | POA: Diagnosis not present

## 2020-08-09 DIAGNOSIS — D62 Acute posthemorrhagic anemia: Secondary | ICD-10-CM | POA: Diagnosis not present

## 2020-08-09 DIAGNOSIS — K660 Peritoneal adhesions (postprocedural) (postinfection): Secondary | ICD-10-CM | POA: Diagnosis present

## 2020-08-09 DIAGNOSIS — Z20822 Contact with and (suspected) exposure to covid-19: Secondary | ICD-10-CM | POA: Diagnosis present

## 2020-08-09 DIAGNOSIS — Z7982 Long term (current) use of aspirin: Secondary | ICD-10-CM | POA: Diagnosis not present

## 2020-08-09 DIAGNOSIS — I2781 Cor pulmonale (chronic): Secondary | ICD-10-CM | POA: Diagnosis present

## 2020-08-09 DIAGNOSIS — Z79899 Other long term (current) drug therapy: Secondary | ICD-10-CM | POA: Diagnosis not present

## 2020-08-09 DIAGNOSIS — R918 Other nonspecific abnormal finding of lung field: Secondary | ICD-10-CM | POA: Diagnosis not present

## 2020-08-09 DIAGNOSIS — Z23 Encounter for immunization: Secondary | ICD-10-CM | POA: Diagnosis present

## 2020-08-09 DIAGNOSIS — N281 Cyst of kidney, acquired: Secondary | ICD-10-CM | POA: Diagnosis not present

## 2020-08-09 DIAGNOSIS — Z9851 Tubal ligation status: Secondary | ICD-10-CM | POA: Diagnosis not present

## 2020-08-09 DIAGNOSIS — I471 Supraventricular tachycardia: Secondary | ICD-10-CM | POA: Diagnosis not present

## 2020-08-09 DIAGNOSIS — R109 Unspecified abdominal pain: Secondary | ICD-10-CM | POA: Diagnosis not present

## 2020-08-09 DIAGNOSIS — J90 Pleural effusion, not elsewhere classified: Secondary | ICD-10-CM | POA: Diagnosis not present

## 2020-08-09 DIAGNOSIS — Z452 Encounter for adjustment and management of vascular access device: Secondary | ICD-10-CM | POA: Diagnosis not present

## 2020-08-09 DIAGNOSIS — J9811 Atelectasis: Secondary | ICD-10-CM | POA: Diagnosis not present

## 2020-08-09 DIAGNOSIS — I361 Nonrheumatic tricuspid (valve) insufficiency: Secondary | ICD-10-CM | POA: Diagnosis not present

## 2020-08-09 DIAGNOSIS — E876 Hypokalemia: Secondary | ICD-10-CM | POA: Diagnosis present

## 2020-08-09 DIAGNOSIS — I340 Nonrheumatic mitral (valve) insufficiency: Secondary | ICD-10-CM | POA: Diagnosis not present

## 2020-08-09 DIAGNOSIS — I350 Nonrheumatic aortic (valve) stenosis: Secondary | ICD-10-CM | POA: Diagnosis not present

## 2020-08-09 DIAGNOSIS — Z9049 Acquired absence of other specified parts of digestive tract: Secondary | ICD-10-CM | POA: Diagnosis not present

## 2020-08-09 DIAGNOSIS — J9611 Chronic respiratory failure with hypoxia: Secondary | ICD-10-CM | POA: Diagnosis present

## 2020-08-09 DIAGNOSIS — K5939 Other megacolon: Secondary | ICD-10-CM | POA: Diagnosis not present

## 2020-08-09 HISTORY — PX: LAPAROTOMY: SHX154

## 2020-08-09 HISTORY — PX: LYSIS OF ADHESION: SHX5961

## 2020-08-09 LAB — CBC WITH DIFFERENTIAL/PLATELET
Abs Immature Granulocytes: 0.1 10*3/uL — ABNORMAL HIGH (ref 0.00–0.07)
Basophils Absolute: 0.1 10*3/uL (ref 0.0–0.1)
Basophils Relative: 0 %
Eosinophils Absolute: 0.1 10*3/uL (ref 0.0–0.5)
Eosinophils Relative: 0 %
HCT: 49.6 % — ABNORMAL HIGH (ref 36.0–46.0)
Hemoglobin: 15.7 g/dL — ABNORMAL HIGH (ref 12.0–15.0)
Immature Granulocytes: 1 %
Lymphocytes Relative: 4 %
Lymphs Abs: 0.7 10*3/uL (ref 0.7–4.0)
MCH: 31.7 pg (ref 26.0–34.0)
MCHC: 31.7 g/dL (ref 30.0–36.0)
MCV: 100 fL (ref 80.0–100.0)
Monocytes Absolute: 0.6 10*3/uL (ref 0.1–1.0)
Monocytes Relative: 3 %
Neutro Abs: 19.5 10*3/uL — ABNORMAL HIGH (ref 1.7–7.7)
Neutrophils Relative %: 92 %
Platelets: 255 10*3/uL (ref 150–400)
RBC: 4.96 MIL/uL (ref 3.87–5.11)
RDW: 13.8 % (ref 11.5–15.5)
WBC: 21 10*3/uL — ABNORMAL HIGH (ref 4.0–10.5)
nRBC: 0 % (ref 0.0–0.2)

## 2020-08-09 LAB — TYPE AND SCREEN
ABO/RH(D): O POS
Antibody Screen: NEGATIVE

## 2020-08-09 LAB — URINALYSIS, ROUTINE W REFLEX MICROSCOPIC
Bacteria, UA: NONE SEEN
Bilirubin Urine: NEGATIVE
Glucose, UA: NEGATIVE mg/dL
Ketones, ur: NEGATIVE mg/dL
Leukocytes,Ua: NEGATIVE
Nitrite: NEGATIVE
Protein, ur: NEGATIVE mg/dL
Specific Gravity, Urine: 1.044 — ABNORMAL HIGH (ref 1.005–1.030)
pH: 6 (ref 5.0–8.0)

## 2020-08-09 LAB — BASIC METABOLIC PANEL
Anion gap: 12 (ref 5–15)
BUN: 15 mg/dL (ref 8–23)
CO2: 26 mmol/L (ref 22–32)
Calcium: 8.5 mg/dL — ABNORMAL LOW (ref 8.9–10.3)
Chloride: 99 mmol/L (ref 98–111)
Creatinine, Ser: 0.87 mg/dL (ref 0.44–1.00)
GFR calc Af Amer: >60 mL/min (ref >60)
GFR calc non Af Amer: >60 mL/min (ref >60)
Glucose, Bld: 149 mg/dL — ABNORMAL HIGH (ref 70–99)
Potassium: 3.7 mmol/L (ref 3.5–5.1)
Sodium: 137 mmol/L (ref 135–145)

## 2020-08-09 LAB — HEMOGLOBIN A1C
Hgb A1c MFr Bld: 5.2 % (ref 4.8–5.6)
Mean Plasma Glucose: 102.54 mg/dL

## 2020-08-09 LAB — GLUCOSE, CAPILLARY
Glucose-Capillary: 129 mg/dL — ABNORMAL HIGH (ref 70–99)
Glucose-Capillary: 142 mg/dL — ABNORMAL HIGH (ref 70–99)
Glucose-Capillary: 145 mg/dL — ABNORMAL HIGH (ref 70–99)

## 2020-08-09 LAB — TSH: TSH: 0.777 u[IU]/mL (ref 0.350–4.500)

## 2020-08-09 LAB — MAGNESIUM: Magnesium: 2.1 mg/dL (ref 1.7–2.4)

## 2020-08-09 LAB — SARS CORONAVIRUS 2 BY RT PCR (HOSPITAL ORDER, PERFORMED IN ~~LOC~~ HOSPITAL LAB): SARS Coronavirus 2: NEGATIVE

## 2020-08-09 SURGERY — LAPAROTOMY, EXPLORATORY
Anesthesia: General | Site: Abdomen

## 2020-08-09 MED ORDER — MORPHINE SULFATE (PF) 2 MG/ML IV SOLN: 2.0000 mg | INTRAVENOUS | Status: DC | PRN

## 2020-08-09 MED ORDER — BUPIVACAINE LIPOSOME 1.3 % IJ SUSP
INTRAMUSCULAR | Status: DC | PRN
  Administered 2020-08-09: 20 mL

## 2020-08-09 MED ORDER — PROPOFOL 10 MG/ML IV BOLUS
INTRAVENOUS | Status: DC | PRN
  Administered 2020-08-09: 70 mg via INTRAVENOUS

## 2020-08-09 MED ORDER — SUCCINYLCHOLINE CHLORIDE 200 MG/10ML IV SOSY
PREFILLED_SYRINGE | INTRAVENOUS | Status: AC
  Filled 2020-08-09: qty 20

## 2020-08-09 MED ORDER — LIDOCAINE 2% (20 MG/ML) 5 ML SYRINGE
INTRAMUSCULAR | Status: DC | PRN
  Administered 2020-08-09: 80 mg via INTRAVENOUS

## 2020-08-09 MED ORDER — CHLORHEXIDINE GLUCONATE CLOTH 2 % EX PADS
6.0000 | MEDICATED_PAD | Freq: Once | CUTANEOUS | Status: AC
  Administered 2020-08-09: 6 via TOPICAL

## 2020-08-09 MED ORDER — GLYCOPYRROLATE PF 0.2 MG/ML IJ SOSY
PREFILLED_SYRINGE | INTRAMUSCULAR | Status: AC
  Filled 2020-08-09: qty 2

## 2020-08-09 MED ORDER — ETOMIDATE 2 MG/ML IV SOLN
INTRAVENOUS | Status: AC
  Filled 2020-08-09: qty 10

## 2020-08-09 MED ORDER — HYDROMORPHONE HCL 1 MG/ML IJ SOLN: 0.2500 mg | INTRAMUSCULAR | Status: DC | PRN

## 2020-08-09 MED ORDER — ONDANSETRON HCL 4 MG/2ML IJ SOLN: 4.0000 mg | Freq: Once | INTRAMUSCULAR | Status: DC | PRN

## 2020-08-09 MED ORDER — PHENYLEPHRINE 40 MCG/ML (10ML) SYRINGE FOR IV PUSH (FOR BLOOD PRESSURE SUPPORT)
PREFILLED_SYRINGE | INTRAVENOUS | Status: DC | PRN
  Administered 2020-08-09 (×2): 120 ug via INTRAVENOUS
  Administered 2020-08-09 (×2): 80 ug via INTRAVENOUS

## 2020-08-09 MED ORDER — FENTANYL CITRATE (PF) 100 MCG/2ML IJ SOLN
50.0000 ug | Freq: Once | INTRAMUSCULAR | Status: AC
  Administered 2020-08-09: 50 ug via INTRAVENOUS
  Filled 2020-08-09: qty 2

## 2020-08-09 MED ORDER — HYDRALAZINE HCL 20 MG/ML IJ SOLN: 10.0000 mg | Freq: Four times a day (QID) | INTRAMUSCULAR | Status: DC | PRN

## 2020-08-09 MED ORDER — FENTANYL CITRATE (PF) 250 MCG/5ML IJ SOLN
INTRAMUSCULAR | Status: AC
  Filled 2020-08-09: qty 5

## 2020-08-09 MED ORDER — INFLUENZA VAC A&B SA ADJ QUAD 0.5 ML IM PRSY
0.5000 mL | PREFILLED_SYRINGE | INTRAMUSCULAR | Status: DC
  Filled 2020-08-09: qty 0.5

## 2020-08-09 MED ORDER — ONDANSETRON HCL 4 MG/2ML IJ SOLN
4.0000 mg | Freq: Once | INTRAMUSCULAR | Status: AC
  Administered 2020-08-09: 4 mg via INTRAVENOUS
  Filled 2020-08-09: qty 2

## 2020-08-09 MED ORDER — ROCURONIUM BROMIDE 10 MG/ML (PF) SYRINGE
PREFILLED_SYRINGE | INTRAVENOUS | Status: AC
  Filled 2020-08-09: qty 10

## 2020-08-09 MED ORDER — FENTANYL CITRATE (PF) 100 MCG/2ML IJ SOLN
INTRAMUSCULAR | Status: DC | PRN
Start: 2020-08-09 — End: 2020-08-09
  Administered 2020-08-09 (×3): 50 ug via INTRAVENOUS

## 2020-08-09 MED ORDER — POTASSIUM CHLORIDE 10 MEQ/100ML IV SOLN
10.0000 meq | INTRAVENOUS | Status: AC
  Administered 2020-08-09: 10 meq via INTRAVENOUS
  Filled 2020-08-09: qty 100

## 2020-08-09 MED ORDER — METOCLOPRAMIDE HCL 5 MG/ML IJ SOLN
5.0000 mg | Freq: Once | INTRAMUSCULAR | Status: AC
  Administered 2020-08-09: 5 mg via INTRAVENOUS
  Filled 2020-08-09: qty 2

## 2020-08-09 MED ORDER — PHENOL 1.4 % MT LIQD
1.0000 | OROMUCOSAL | Status: DC | PRN
  Administered 2020-08-09: 1 via OROMUCOSAL
  Filled 2020-08-09: qty 177

## 2020-08-09 MED ORDER — IOHEXOL 300 MG/ML  SOLN
100.0000 mL | Freq: Once | INTRAMUSCULAR | Status: AC | PRN
  Administered 2020-08-09: 100 mL via INTRAVENOUS

## 2020-08-09 MED ORDER — HYDROCHLOROTHIAZIDE 25 MG PO TABS: 25.0000 mg | ORAL_TABLET | Freq: Every day | ORAL | Status: DC

## 2020-08-09 MED ORDER — ONDANSETRON HCL 4 MG PO TABS: 4.0000 mg | ORAL_TABLET | Freq: Four times a day (QID) | ORAL | Status: DC | PRN

## 2020-08-09 MED ORDER — ROCURONIUM BROMIDE 10 MG/ML (PF) SYRINGE
PREFILLED_SYRINGE | INTRAVENOUS | Status: DC | PRN
  Administered 2020-08-09: 40 mg via INTRAVENOUS

## 2020-08-09 MED ORDER — DEXAMETHASONE SODIUM PHOSPHATE 10 MG/ML IJ SOLN
INTRAMUSCULAR | Status: AC
  Filled 2020-08-09: qty 3

## 2020-08-09 MED ORDER — HEPARIN SODIUM (PORCINE) 5000 UNIT/ML IJ SOLN
5000.0000 [IU] | Freq: Three times a day (TID) | INTRAMUSCULAR | Status: DC
  Administered 2020-08-09 (×2): 5000 [IU] via SUBCUTANEOUS
  Filled 2020-08-09 (×3): qty 1

## 2020-08-09 MED ORDER — SODIUM CHLORIDE 0.9 % IR SOLN
Status: DC | PRN
  Administered 2020-08-09: 2000 mL

## 2020-08-09 MED ORDER — CHLORHEXIDINE GLUCONATE 0.12 % MT SOLN: 15.0000 mL | Freq: Once | OROMUCOSAL | Status: DC

## 2020-08-09 MED ORDER — LACTATED RINGERS IV BOLUS: 1000.0000 mL | Freq: Once | INTRAVENOUS | Status: DC

## 2020-08-09 MED ORDER — LIDOCAINE HCL (PF) 0.5 % IJ SOLN
INTRAMUSCULAR | Status: AC
  Filled 2020-08-09: qty 50

## 2020-08-09 MED ORDER — DEXAMETHASONE SODIUM PHOSPHATE 10 MG/ML IJ SOLN
INTRAMUSCULAR | Status: DC | PRN
  Administered 2020-08-09: 10 mg via INTRAVENOUS

## 2020-08-09 MED ORDER — LACTATED RINGERS IV SOLN: Freq: Once | INTRAVENOUS | Status: DC

## 2020-08-09 MED ORDER — ONDANSETRON HCL 4 MG/2ML IJ SOLN
4.0000 mg | Freq: Four times a day (QID) | INTRAMUSCULAR | Status: DC | PRN
  Administered 2020-08-12 – 2020-08-15 (×4): 4 mg via INTRAVENOUS
  Filled 2020-08-09 (×4): qty 2

## 2020-08-09 MED ORDER — PHENYLEPHRINE 40 MCG/ML (10ML) SYRINGE FOR IV PUSH (FOR BLOOD PRESSURE SUPPORT)
PREFILLED_SYRINGE | INTRAVENOUS | Status: AC
  Filled 2020-08-09: qty 30

## 2020-08-09 MED ORDER — SODIUM CHLORIDE 0.9 % IV SOLN
INTRAVENOUS | Status: AC
  Filled 2020-08-09: qty 2

## 2020-08-09 MED ORDER — PHENYLEPHRINE HCL (PRESSORS) 10 MG/ML IV SOLN
INTRAVENOUS | Status: AC
  Filled 2020-08-09: qty 1

## 2020-08-09 MED ORDER — METOPROLOL TARTRATE 5 MG/5ML IV SOLN
5.0000 mg | Freq: Once | INTRAVENOUS | Status: AC
  Administered 2020-08-09: 5 mg via INTRAVENOUS
  Filled 2020-08-09: qty 5

## 2020-08-09 MED ORDER — MORPHINE SULFATE (PF) 4 MG/ML IV SOLN
4.0000 mg | INTRAVENOUS | Status: DC | PRN
  Administered 2020-08-09 – 2020-08-14 (×8): 4 mg via INTRAVENOUS
  Filled 2020-08-09 (×9): qty 1

## 2020-08-09 MED ORDER — BUPIVACAINE LIPOSOME 1.3 % IJ SUSP
INTRAMUSCULAR | Status: AC
  Filled 2020-08-09: qty 20

## 2020-08-09 MED ORDER — ONDANSETRON HCL 4 MG/2ML IJ SOLN
INTRAMUSCULAR | Status: AC
  Filled 2020-08-09: qty 2

## 2020-08-09 MED ORDER — LEVOTHYROXINE SODIUM 100 MCG PO TABS: 100.0000 ug | ORAL_TABLET | Freq: Every day | ORAL | Status: DC

## 2020-08-09 MED ORDER — ONDANSETRON HCL 4 MG/2ML IJ SOLN
INTRAMUSCULAR | Status: DC | PRN
  Administered 2020-08-09: 4 mg via INTRAVENOUS

## 2020-08-09 MED ORDER — LACTATED RINGERS IV SOLN: INTRAVENOUS | Status: DC | PRN

## 2020-08-09 MED ORDER — ETOMIDATE 2 MG/ML IV SOLN
INTRAVENOUS | Status: AC
  Filled 2020-08-09: qty 20

## 2020-08-09 MED ORDER — SUGAMMADEX SODIUM 200 MG/2ML IV SOLN
INTRAVENOUS | Status: DC | PRN
  Administered 2020-08-09: 200 mg via INTRAVENOUS

## 2020-08-09 MED ORDER — PROPOFOL 10 MG/ML IV BOLUS
INTRAVENOUS | Status: AC
  Filled 2020-08-09: qty 20

## 2020-08-09 MED ORDER — SUCCINYLCHOLINE CHLORIDE 200 MG/10ML IV SOSY
PREFILLED_SYRINGE | INTRAVENOUS | Status: AC
  Filled 2020-08-09: qty 10

## 2020-08-09 MED ORDER — SODIUM CHLORIDE 0.9 % IV SOLN: INTRAVENOUS | Status: DC

## 2020-08-09 MED ORDER — LIDOCAINE 2% (20 MG/ML) 5 ML SYRINGE
INTRAMUSCULAR | Status: AC
  Filled 2020-08-09: qty 5

## 2020-08-09 MED ORDER — VERAPAMIL HCL ER 240 MG PO TBCR: 240.0000 mg | EXTENDED_RELEASE_TABLET | Freq: Every day | ORAL | Status: DC

## 2020-08-09 MED ORDER — CHLORHEXIDINE GLUCONATE CLOTH 2 % EX PADS
6.0000 | MEDICATED_PAD | Freq: Every day | CUTANEOUS | Status: DC
  Administered 2020-08-10 – 2020-08-19 (×7): 6 via TOPICAL

## 2020-08-09 MED ORDER — SODIUM CHLORIDE 0.9 % IV SOLN
2.0000 g | INTRAVENOUS | Status: AC
  Administered 2020-08-09: 2 g via INTRAVENOUS
  Filled 2020-08-09: qty 2

## 2020-08-09 MED ORDER — ORAL CARE MOUTH RINSE: 15.0000 mL | Freq: Once | OROMUCOSAL | Status: DC

## 2020-08-09 MED ORDER — SUCCINYLCHOLINE CHLORIDE 200 MG/10ML IV SOSY
PREFILLED_SYRINGE | INTRAVENOUS | Status: DC | PRN
  Administered 2020-08-09: 120 mg via INTRAVENOUS

## 2020-08-09 MED ORDER — POTASSIUM CHLORIDE 10 MEQ/100ML IV SOLN
10.0000 meq | INTRAVENOUS | Status: AC
  Administered 2020-08-09 (×2): 10 meq via INTRAVENOUS
  Filled 2020-08-09 (×2): qty 100

## 2020-08-09 SURGICAL SUPPLY — 67 items
APL PRP STRL LF DISP 70% ISPRP (MISCELLANEOUS) ×1
APPLIER CLIP 11 MED OPEN (CLIP)
APPLIER CLIP 13 LRG OPEN (CLIP)
APR CLP LRG 13 20 CLIP (CLIP)
APR CLP MED 11 20 MLT OPN (CLIP)
BARRIER SKIN 2 3/4 (OSTOMY) IMPLANT
BARRIER SKIN OD2.25 2 3/4 FLNG (OSTOMY) IMPLANT
BRR SKN FLT 2.75X2.25 2 PC (OSTOMY)
CELLS DAT CNTRL 66122 CELL SVR (MISCELLANEOUS) IMPLANT
CHLORAPREP W/TINT 26 (MISCELLANEOUS) ×2 IMPLANT
CLAMP POUCH DRAINAGE QUIET (OSTOMY) IMPLANT
CLIP APPLIE 11 MED OPEN (CLIP) IMPLANT
CLIP APPLIE 13 LRG OPEN (CLIP) IMPLANT
CLOTH BEACON ORANGE TIMEOUT ST (SAFETY) ×2 IMPLANT
COVER LIGHT HANDLE STERIS (MISCELLANEOUS) ×4 IMPLANT
COVER WAND RF STERILE (DRAPES) ×2 IMPLANT
DRAPE EENT ADH APERT 31X51 STR (DRAPES) ×1 IMPLANT
DRAPE INCISE IOBAN 44X35 STRL (DRAPES) ×2 IMPLANT
DRAPE WARM FLUID 44X44 (DRAPES) ×2 IMPLANT
DRSG OPSITE POSTOP 4X8 (GAUZE/BANDAGES/DRESSINGS) ×1 IMPLANT
ELECT REM PT RETURN 9FT ADLT (ELECTROSURGICAL) ×2
ELECTRODE REM PT RTRN 9FT ADLT (ELECTROSURGICAL) ×1 IMPLANT
GLOVE BIO SURGEON STRL SZ 6.5 (GLOVE) ×2 IMPLANT
GLOVE BIOGEL PI IND STRL 6.5 (GLOVE) ×1 IMPLANT
GLOVE BIOGEL PI IND STRL 7.0 (GLOVE) ×2 IMPLANT
GLOVE BIOGEL PI INDICATOR 6.5 (GLOVE) ×1
GLOVE BIOGEL PI INDICATOR 7.0 (GLOVE) ×2
GLOVE SURG SS PI 7.5 STRL IVOR (GLOVE) ×2 IMPLANT
GOWN STRL REUS W/TWL LRG LVL3 (GOWN DISPOSABLE) ×6 IMPLANT
HANDLE SUCTION POOLE (INSTRUMENTS) ×1 IMPLANT
INST SET MAJOR GENERAL (KITS) ×2 IMPLANT
KIT REMOVER STAPLE SKIN (MISCELLANEOUS) IMPLANT
KIT TURNOVER KIT A (KITS) ×2 IMPLANT
MANIFOLD NEPTUNE II (INSTRUMENTS) ×2 IMPLANT
NDL HYPO 18GX1.5 BLUNT FILL (NEEDLE) ×1 IMPLANT
NEEDLE HYPO 18GX1.5 BLUNT FILL (NEEDLE) ×4 IMPLANT
NEEDLE HYPO 22GX1.5 SAFETY (NEEDLE) ×2 IMPLANT
NS IRRIG 1000ML POUR BTL (IV SOLUTION) ×4 IMPLANT
PACK ABDOMINAL MAJOR (CUSTOM PROCEDURE TRAY) ×1 IMPLANT
PAD ARMBOARD 7.5X6 YLW CONV (MISCELLANEOUS) ×2 IMPLANT
PENCIL SMOKE EVACUATOR COATED (MISCELLANEOUS) ×2 IMPLANT
POUCH OSTOMY 2 3/4  H 3804 (WOUND CARE)
POUCH OSTOMY 2 3/4 H 3804 (WOUND CARE)
POUCH OSTOMY 2 PC DRNBL 2.75 (WOUND CARE) IMPLANT
RELOAD LINEAR CUT PROX 55 BLUE (ENDOMECHANICALS) IMPLANT
RELOAD PROXIMATE 75MM BLUE (ENDOMECHANICALS) IMPLANT
RELOAD STAPLE 55 3.8 BLU REG (ENDOMECHANICALS) IMPLANT
RELOAD STAPLE 75 3.8 BLU REG (ENDOMECHANICALS) IMPLANT
RETRACTOR WND ALEXIS 18 MED (MISCELLANEOUS) IMPLANT
RETRACTOR WND ALEXIS 25 LRG (MISCELLANEOUS) IMPLANT
RTRCTR WOUND ALEXIS 18CM MED (MISCELLANEOUS)
RTRCTR WOUND ALEXIS 25CM LRG (MISCELLANEOUS)
SET BASIN LINEN APH (SET/KITS/TRAYS/PACK) ×2 IMPLANT
SPONGE LAP 18X18 RF (DISPOSABLE) ×2 IMPLANT
STAPLER GUN LINEAR PROX 60 (STAPLE) IMPLANT
STAPLER PROXIMATE 55 BLUE (STAPLE) IMPLANT
STAPLER PROXIMATE 75MM BLUE (STAPLE) IMPLANT
STAPLER VISISTAT (STAPLE) ×2 IMPLANT
SUCTION POOLE HANDLE (INSTRUMENTS) ×2
SUT CHROMIC 0 SH (SUTURE) IMPLANT
SUT CHROMIC 2 0 SH (SUTURE) IMPLANT
SUT CHROMIC 3 0 SH 27 (SUTURE) IMPLANT
SUT PDS AB CT VIOLET #0 27IN (SUTURE) ×4 IMPLANT
SUT PROLENE 2 0 SH 30 (SUTURE) ×1 IMPLANT
SUT SILK 3 0 SH CR/8 (SUTURE) ×2 IMPLANT
SYR 20ML LL LF (SYRINGE) ×5 IMPLANT
TRAY FOLEY MTR SLVR 16FR STAT (SET/KITS/TRAYS/PACK) ×2 IMPLANT

## 2020-08-09 NOTE — Consult Note (Signed)
[facility] Consult  Reason for Consult: Small bowel volvulus Referring Physician:  Dr. Lynelle Doctor, MD   Chief Complaint    Abdominal Pain      HPI: Michelle Wall is a 86 y.o. female with a history of pulmonary HTN, HTN, Tricuspid insufficiency, who had a prior emergency surgery for perforated diverticulitis with me and end colostomy placement in 2019. She came in with abdominal pain and distention. She had no output from her ostomy for the remainder of the day. She had some associated nausea and vomiting. We had talked about colostomy reversal in 2019 but she never returned.   Dr. Lynelle Doctor called and I reviewed her imaging. NG was placed and resuscitation with K was started in the ED and IVF.   Past Medical History:  Diagnosis Date  . Carotid atherosclerosis   . Cor pulmonale (HCC)   . Emphysema   . Hypertension   . Hypothyroidism   . Pulmonary hypertension (HCC)    moderate  last 2D echo EF greater than 55%  mild to moderate tricuspid insufficiency  . Tricuspid insufficiency    moderate to severe    Past Surgical History:  Procedure Laterality Date  . COLONOSCOPY N/A 12/14/2018   Procedure: COLONOSCOPY;  Surgeon: Malissa Hippo, MD;  Location: [facility];  Service: Endoscopy;  Laterality: N/A;  1:00  . COLOSTOMY Left 06/14/2018   Procedure: COLOSTOMY;  Surgeon: Lucretia Roers, MD;  Location: [facility] ORS;  Service: General;  Laterality: Left;  . PARTIAL COLECTOMY N/A 06/14/2018   Procedure: PARTIAL COLECTOMY;  Surgeon: Lucretia Roers, MD;  Location: [facility] ORS;  Service: General;  Laterality: N/A;  . POLYPECTOMY  12/14/2018   Procedure: POLYPECTOMY;  Surgeon: Malissa Hippo, MD;  Location: [facility];  Service: Endoscopy;;  rectum  . THYROIDECTOMY, PARTIAL    . TUBAL LIGATION     midline scar ? possibly tubal ligation    Family History  Problem Relation Age of Onset  . Cancer Other     Social History   Tobacco Use  . Smoking status: Current Every  Day Smoker    Packs/day: 0.50    Years: 60.00    Pack years: 30.00    Types: Cigarettes  . Smokeless tobacco: Never Used  Vaping Use  . Vaping Use: Never used  Substance Use Topics  . Alcohol use: No  . Drug use: No    Medications: I have reviewed the patient's current medications. Current Facility-Administered Medications  Medication Dose Route Frequency Provider Last Rate Last Admin  . 0.9 %  sodium chloride infusion   Intravenous Continuous Zierle-Ghosh, Asia B, DO   Stopped at 08/09/20 2202  . cefoTEtan (CEFOTAN) 2 g in sodium chloride 0.9 % 100 mL IVPB  2 g Intravenous On Call to OR Zierle-Ghosh, Asia B, DO      . heparin injection 5,000 Units  5,000 Units Subcutaneous Q8H Zierle-Ghosh, Asia B, DO      . hydrALAZINE (APRESOLINE) injection 10 mg  10 mg Intravenous Q6H PRN Zierle-Ghosh, Asia B, DO      . [START ON 08/10/2020] hydrochlorothiazide (HYDRODIURIL) tablet 25 mg  25 mg Oral Daily Zierle-Ghosh, Asia B, DO      . lactated ringers bolus 1,000 mL  1,000 mL Intravenous Once Roselie Skinner C, MD      . Melene Muller ON 08/10/2020] levothyroxine (SYNTHROID) tablet 100 mcg  100 mcg Oral Q0600 Zierle-Ghosh, Asia B, DO      . morphine 4 MG/ML  injection 4 mg  4 mg Intravenous Q2H PRN Zierle-Ghosh, Asia B, DO   4 mg at 08/09/20 0500  . ondansetron (ZOFRAN) tablet 4 mg  4 mg Oral Q6H PRN Zierle-Ghosh, Asia B, DO       Or  . ondansetron (ZOFRAN) injection 4 mg  4 mg Intravenous Q6H PRN Zierle-Ghosh, Asia B, DO      . potassium chloride 10 mEq in 100 mL IVPB  10 mEq Intravenous Q1 Hr x 4 Zierle-Ghosh, Asia B, DO   Stopped at 08/09/20 16100623  . [START ON 08/10/2020] verapamil (CALAN-SR) CR tablet 240 mg  240 mg Oral Daily Zierle-Ghosh, Asia B, DO       Current Outpatient Medications  Medication Sig Dispense Refill Last Dose  . aspirin EC 81 MG tablet Take 1 tablet (81 mg total) by mouth daily.     . cholecalciferol (VITAMIN D3) 25 MCG (1000 UT) tablet Take 1,000 Units by mouth daily.     Marland Kitchen.  docusate sodium (COLACE) 100 MG capsule Take 1 capsule (100 mg total) by mouth 2 (two) times daily. 60 capsule 2   . DOK 100 MG capsule TAKE 1 CAPSULE(100 MG) BY MOUTH TWICE DAILY 60 capsule 2   . hydrochlorothiazide (HYDRODIURIL) 25 MG tablet Take 25 mg by mouth daily.      Marland Kitchen. latanoprost (XALATAN) 0.005 % ophthalmic solution Place 1 drop into both eyes at bedtime.     Marland Kitchen. SYNTHROID 100 MCG tablet Take 100 mcg by mouth daily before breakfast.   3   . verapamil (CALAN-SR) 240 MG CR tablet Take 240 mg by mouth daily.        No Known Allergies   ROS:  A comprehensive review of systems was negative except for: Gastrointestinal: positive for abdominal pain, nausea, vomiting and no ostomy output  Blood pressure (!) 165/71, pulse 81, temperature 98.7 F (37.1 C), temperature source Oral, resp. rate 13, height 5\' 3"  (1.6 m), weight 65.8 kg, SpO2 93 %. Physical Exam Vitals reviewed.  Constitutional:      Appearance: She is well-developed.  HENT:     Head: Normocephalic.  Eyes:     Extraocular Movements: Extraocular movements intact.  Cardiovascular:     Rate and Rhythm: Normal rate.  Pulmonary:     Effort: Pulmonary effort is normal.  Abdominal:     General: There is distension.     Tenderness: There is abdominal tenderness.  Musculoskeletal:     Comments: No leg swelling  Skin:    General: Skin is warm and dry.  Neurological:     Mental Status: She is alert.  Psychiatric:        Mood and Affect: Mood normal.        Behavior: Behavior normal.     Results: Results for orders placed or performed during the hospital encounter of 08/08/20 (from the past 48 hour(s))  Lipase, blood     Status: None   Collection Time: 08/08/20  8:53 PM  Result Value Ref Range   Lipase 34 11 - 51 U/L    Comment: Performed at [facility], [address] [place], [place] [address]  Comprehensive metabolic panel     Status: Abnormal   Collection Time: 08/08/20  8:53 PM  Result Value Ref Range   Sodium  136 135 - 145 mmol/L   Potassium 2.8 (L) 3.5 - 5.1 mmol/L   Chloride 95 (L) 98 - 111 mmol/L   CO2 27 22 - 32 mmol/L   Glucose,  Bld 170 (H) 70 - 99 mg/dL    Comment: Glucose reference range applies only to samples taken after fasting for at least 8 hours.   BUN 14 8 - 23 mg/dL   Creatinine, Ser 4.94 0.44 - 1.00 mg/dL   Calcium 9.7 8.9 - 49.6 mg/dL   Total Protein 8.7 (H) 6.5 - 8.1 g/dL   Albumin 4.6 3.5 - 5.0 g/dL   AST 15 15 - 41 U/L   ALT 13 0 - 44 U/L   Alkaline Phosphatase 35 (L) 38 - 126 U/L   Total Bilirubin 0.7 0.3 - 1.2 mg/dL   GFR calc non Af Amer >60 >60 mL/min   GFR calc Af Amer >60 >60 mL/min   Anion gap 14 5 - 15    Comment: Performed at [facility], [address] [place], [place] [address]  CBC     Status: Abnormal   Collection Time: 08/08/20  8:53 PM  Result Value Ref Range   WBC 9.3 4.0 - 10.5 K/uL   RBC 4.78 3.87 - 5.11 MIL/uL   Hemoglobin 15.0 12.0 - 15.0 g/dL   HCT 38.4 (H) 36 - 46 %   MCV 97.5 80.0 - 100.0 fL   MCH 31.4 26.0 - 34.0 pg   MCHC 32.2 30.0 - 36.0 g/dL   RDW 66.5 99.3 - 57.0 %   Platelets 218 150 - 400 K/uL   nRBC 0.0 0.0 - 0.2 %    Comment: Performed at [facility], [address], [place], [place] [address]  TSH     Status: None   Collection Time: 08/08/20  8:53 PM  Result Value Ref Range   TSH 0.777 0.350 - 4.500 uIU/mL    Comment: Performed by a 3rd Generation assay with a functional sensitivity of <=0.01 uIU/mL. Performed at [facility], [address], [place], [place] [address]   SARS Coronavirus 2 by RT PCR (hospital order, performed in [facility] hospital lab) Nasopharyngeal Nasopharyngeal Swab     Status: None   Collection Time: 08/09/20  3:02 AM   Specimen: Nasopharyngeal Swab  Result Value Ref Range   SARS Coronavirus 2 NEGATIVE NEGATIVE    Comment: (NOTE) SARS-CoV-2 target nucleic acids are NOT DETECTED.  The SARS-CoV-2 RNA is generally detectable in upper and lower respiratory specimens during the acute phase of  infection. The lowest concentration of SARS-CoV-2 viral copies this assay can detect is 250 copies / mL. A negative result does not preclude SARS-CoV-2 infection and should not be used as the sole basis for treatment or other patient management decisions.  A negative result may occur with improper specimen collection / handling, submission of specimen other than nasopharyngeal swab, presence of viral mutation(s) within the areas targeted by this assay, and inadequate number of viral copies (<250 copies / mL). A negative result must be combined with clinical observations, patient history, and epidemiological information.  Fact Sheet for Patients:   BoilerBrush.com.cy  Fact Sheet for Healthcare Providers: https://pope.com/  This test is not yet approved or  cleared by the [place] FDA and has been authorized for detection and/or diagnosis of SARS-CoV-2 by FDA under an Emergency Use Authorization (EUA).  This EUA will remain in effect (meaning this test can be used) for the duration of the COVID-19 declaration under Section 564(b)(1) of the Act, 21 U.S.C. section 360bbb-3(b)(1), unless the authorization is terminated or revoked sooner.  Performed at [facility], [address] [place], [place] [address]   Type and screen [facility]  [facility]     Status: None   Collection Time: 08/09/20  5:08 AM  Result Value Ref Range   ABO/RH(D) O POS    Antibody Screen NEG    Sample Expiration      08/12/2020,2359 Performed at [facility], [address], [place], [place] [address]    Personally reviewed- twisted small bowel mesentery with closed loop obstruction, ostomy in place  CT Abdomen Pelvis W Contrast  Result Date: 08/09/2020 CLINICAL DATA:  Upper abdominal pain, emesis EXAM: CT ABDOMEN AND PELVIS WITH CONTRAST TECHNIQUE: Multidetector CT imaging of the abdomen and pelvis was performed using the standard protocol following bolus  administration of intravenous contrast. CONTRAST:  OMNIPAQUE IOHEXOL 300 MG/ML  SOLN COMPARISON:  06/14/2018 FINDINGS: Lower chest: Hypoventilatory changes are seen at the lung bases. Hepatobiliary: Stable 9 mm hypodensity right lobe liver likely a small cyst or hemangioma. The remainder of the liver is unremarkable. The gallbladder is normal. No biliary dilation. Pancreas: Unremarkable. No pancreatic ductal dilatation or surrounding inflammatory changes. Spleen: Normal in size without focal abnormality. Adrenals/Urinary Tract: Multiple bilateral renal cortical cysts are seen, largest in the upper pole right kidney measuring 6.3 cm. No hydronephrosis or nephrolithiasis. Bladder is unremarkable. Stomach/Bowel: Dilated segment of jejunum within the mid abdomen is seen, measuring up to 4 cm in diameter. There is twisting of the central mesentery, compatible with closed loop obstruction and midgut volvulus. No evidence of bowel wall ischemia or pneumatosis. Normal appendix right lower quadrant. Postsurgical changes from distal colectomy, with left lower quadrant colostomy. Vascular/Lymphatic: Aortic atherosclerosis. No enlarged abdominal or pelvic lymph nodes. Reproductive: Uterus and bilateral adnexa are unremarkable. Other: Small volume ascites within the right upper quadrant and lower pelvis. No free intraperitoneal gas. There is a parastomal hernia within the left lower quadrant containing fat and a portion of colon. Musculoskeletal: No acute or destructive bony lesions. Reconstructed images demonstrate no additional findings. IMPRESSION: 1. Closed loop small-bowel obstruction involving the mid jejunum, with twisting of the mesenteric vessels consistent with midgut volvulus. No evidence of bowel wall ischemia at this time. Surgical consultation recommended. 2. Small volume ascites. 3. Distal colectomy, with left lower quadrant colostomy and small parastomal hernia. 4.  Aortic Atherosclerosis (ICD10-I70.0).  These results were called by telephone at the time of interpretation on 08/09/2020 at 3:00 am to provider IVA KNAPP , who verbally acknowledged these results. Electronically Signed   By: Sharlet Salina M.D.   On: 08/09/2020 03:01   DG Chest Portable 1 View  Result Date: 08/09/2020 CLINICAL DATA:  Nasogastric tube placement EXAM: PORTABLE CHEST 1 VIEW COMPARISON:  06/14/2018 FINDINGS: The enteric tube reaches the stomach with side port near the GE junction. Streaky density at the lung bases. Aortic tortuosity and stable heart size. Calcified right upper lobe nodule. No edema, effusion, or pneumothorax. IMPRESSION: Enteric tube in expected position. Electronically Signed   By: Marnee Spring M.D.   On: 08/09/2020 04:15     Assessment & Plan:  Michelle Wall is a 87 y.o. 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 She had a K to 2.8 but has received 2 replacement mEq. She has received 1000L in the ED prior. She has had some runs of V tach but received metoprolol and her HR 80s.  Her BP is normotensive. No signs of ischemia on CT. Discussed with patient and daughter surgery and plan for exploration possible bowel resection. Discussed risk of bleeding, infection, resection of bowel, need to stay intubated and  ICU care. Discussed that she could get sicker. Discussed possible need for blood and she consented. Discussed possible longterm stay in ICU and need for PEG and Trach. She does not want PEG or Trach but does want to be a Full Code. Her daughter was in the room during these discussion.    All questions were answered to the satisfaction of the patient and family.    Lucretia Roers 08/09/2020, 6:21 AM

## 2020-08-09 NOTE — Anesthesia Postprocedure Evaluation (Signed)
Anesthesia Post Note  Patient: [name]  Procedure(s) Performed: EXPLORATORY LAPAROTOMY (N/A Abdomen) LYSIS OF ADHESIONS (N/A Abdomen)  Patient location during evaluation: PACU Anesthesia Type: General Level of consciousness: awake and alert, oriented and sedated Pain management: pain level controlled Vital Signs Assessment: post-procedure vital signs reviewed and stable Respiratory status: spontaneous breathing, respiratory function stable and patient connected to nasal cannula oxygen Cardiovascular status: blood pressure returned to baseline Postop Assessment: no apparent nausea or vomiting Anesthetic complications: no   No complications documented.   Last Vitals:  Vitals:   08/09/20 1000 08/09/20 1007  BP: (!) 148/69 (!) 157/74  Pulse:  75  Resp: 11 18  Temp:  37 C  SpO2: 96% 100%    Last Pain:  Vitals:   08/09/20 1007  TempSrc: Oral  PainSc: 0-No pain                 Darcelle Herrada C Alzada Brazee

## 2020-08-09 NOTE — H&P (Signed)
TRH H&P    Patient Demographics:    Michelle Wall, is a 86 y.o. female  MRN: [id number]  DOB - Mar 28, 1933  Admit Date - 08/08/2020  Referring MD/NP/PA: Lynelle DoctorKnapp  Outpatient Primary MD for the patient is Mirna MiresHill, Gerald, MD  Patient coming from: Home  Chief complaint- abdominal pain   HPI:    Michelle Wall  is a 86 y.o. 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  Patient reports that the pain started at 4:30 PM on 08/08/2020.  She reports it was gradual in onset, but eventually became severe.  The pain is located across both upper quadrants.  It does not radiate.  Is been constant since it started.  It feels like crampy and sharp.  Patient has associated emesis 3 times that was nonbloody.  She reports that her last normal bowel movement was the morning of 08/08/2020.  She had a normal appetite and felt normal throughout the day until the pain started at 4:30 PM.  Nothing has made the pain better.  Palpation makes it worse.  Patient denies any associated fever, chest pain, palpitations, shortness of breath.  Patient is a current smoker and smokes a pack a day.  She declines nicotine patch at this time.  Of note approximately 2 years ago patient had perforated bowel, with colectomy and left lower quadrant colostomy.  Patient reports no bloody or abnormal output in her colostomy bag.   In the ED Temperature 98.7, heart rate 92, respiratory rate 14, blood pressure 163/76, saturating 100% No leukocytosis with a white blood cell count of 9.3 CHEM panel reveals hypokalemia at 2.8, hyperglycemia at 170 Gap 14 Lipase 34 Covid test pending CT abdomen pelvis shows closed-loop small bowel obstruction involving the mid jejunum with twisting of the  mesenteric vessels consistent with midgut volvulus Cefotetan, fentanyl, Zofran x2, 20 mEq of potassium given in the ED Chest x-ray pending    Review of systems:    In addition to the HPI above,  No Fever-chills, No Headache, No changes with Vision or hearing, No problems swallowing food or Liquids, No Chest pain, Cough or Shortness of Breath, Positive for abdominal pain, nausea, vomiting, No Blood in stool or Urine, No dysuria, No new skin rashes or bruises, No new joints pains-aches,  No new weakness, tingling, numbness in any extremity, No recent weight gain or loss, No polyuria, polydypsia or polyphagia, No significant Mental Stressors.  All other systems reviewed and are negative.    Past History of the following :    Past Medical History:  Diagnosis Date  . Carotid atherosclerosis   . Cor pulmonale (HCC)   . Emphysema   . Hypertension   . Hypothyroidism   . Pulmonary hypertension (HCC)    moderate  last 2D echo EF greater than 55%  mild to moderate tricuspid insufficiency  . Tricuspid insufficiency    moderate to severe      Past  Surgical History:  Procedure Laterality Date  . COLONOSCOPY N/A 12/14/2018   Procedure: COLONOSCOPY;  Surgeon: Malissa Hippo, MD;  [facility];  Service: Endoscopy;  Laterality: N/A;  1:00  . COLOSTOMY Left 06/14/2018   Procedure: COLOSTOMY;  Surgeon: Lucretia Roers, MD;  Location: [facility];  Service: General;  Laterality: Left;  . PARTIAL COLECTOMY N/A 06/14/2018   Procedure: PARTIAL COLECTOMY;  Surgeon: Lucretia Roers, MD;  Location: [facility];  Service: General;  Laterality: N/A;  . POLYPECTOMY  12/14/2018   Procedure: POLYPECTOMY;  Surgeon: Malissa Hippo, MD;  Location: [facility];  Service: Endoscopy;;  rectum  . THYROIDECTOMY, PARTIAL    . TUBAL LIGATION     midline scar ? possibly tubal ligation      Social History:      Social History   Tobacco Use  . Smoking status: Current Every Day Smoker     Packs/day: 0.50    Years: 60.00    Pack years: 30.00    Types: Cigarettes  . Smokeless tobacco: Never Used  Substance Use Topics  . Alcohol use: No       Family History :     Family History  Problem Relation Age of Onset  . Cancer Other       Home Medications:   Prior to Admission medications   Medication Sig Start Date End Date Taking? Authorizing Provider  aspirin EC 81 MG tablet Take 1 tablet (81 mg total) by mouth daily. 12/15/18   Malissa Hippo, MD  cholecalciferol (VITAMIN D3) 25 MCG (1000 UT) tablet Take 1,000 Units by mouth daily.    [provider]  docusate sodium (COLACE) 100 MG capsule Take 1 capsule (100 mg total) by mouth 2 (two) times daily. 01/17/19   Lucretia Roers, MD  DOK 100 MG capsule TAKE 1 CAPSULE(100 MG) BY MOUTH TWICE DAILY 01/17/19   Lucretia Roers, MD  hydrochlorothiazide (HYDRODIURIL) 25 MG tablet Take 25 mg by mouth daily.     [provider]  latanoprost (XALATAN) 0.005 % ophthalmic solution Place 1 drop into both eyes at bedtime. 04/17/14   [provider]  SYNTHROID 100 MCG tablet Take 100 mcg by mouth daily before breakfast.  03/22/18   [provider]  verapamil (CALAN-SR) 240 MG CR tablet Take 240 mg by mouth daily.     [provider]     Allergies:    No Known Allergies   Physical Exam:   Vitals  Blood pressure (!) 152/73, pulse (!) 101, temperature 98.7 F (37.1 C), temperature source Oral, resp. rate 20, height  (1.6 m), weight 65.8 kg, SpO2 90 %.  1.  General: Lying supine in bed in no acute distress  2. Psychiatric: Mood and behavior normal for situation  3. Neurologic: At baseline, cranial nerves intact, moves all 4 extremities voluntarily  4. HEENMT:  Head is atraumatic, normocephalic, sclera are nonicteric, neck is supple with soft palpable mass within the right carotid triangle - daughter reports that it has been there for years, mucous membranes are moist,  trachea is midline  5. Respiratory : Lungs are clear to auscultation bilaterally  6. Cardiovascular : Heart rate and rhythm are regular, no murmurs rubs or gallops  7. Gastrointestinal:  Abdomen is diffusely tender, left lower quadrant colostomy, no distention  8. Skin:  No acute lesions on limited skin exam  9.Musculoskeletal:  No acute deformity or peripheral edema    Data Review:  CBC Recent Labs  Lab 08/08/20 2053  WBC 9.3  HGB 15.0  HCT 46.6*  PLT 218  MCV 97.5  MCH 31.4  MCHC 32.2  RDW 13.7   ------------------------------------------------------------------------------------------------------------------  Results for orders placed or performed during the hospital encounter of 08/08/20 (from the past 48 hour(s))  Lipase, blood     Status: None   Collection Time: 08/08/20  8:53 PM  Result Value Ref Range   Lipase 34 11 - 51 U/L    Comment: Performed at [facility], [address] [place], [place] [address]  Comprehensive metabolic panel     Status: Abnormal   Collection Time: 08/08/20  8:53 PM  Result Value Ref Range   Sodium 136 135 - 145 mmol/L   Potassium 2.8 (L) 3.5 - 5.1 mmol/L   Chloride 95 (L) 98 - 111 mmol/L   CO2 27 22 - 32 mmol/L   Glucose, Bld 170 (H) 70 - 99 mg/dL    Comment: Glucose reference range applies only to samples taken after fasting for at least 8 hours.   BUN 14 8 - 23 mg/dL   Creatinine, Ser 6.04 0.44 - 1.00 mg/dL   Calcium 9.7 8.9 - 54.0 mg/dL   Total Protein 8.7 (H) 6.5 - 8.1 g/dL   Albumin 4.6 3.5 - 5.0 g/dL   AST 15 15 - 41 U/L   ALT 13 0 - 44 U/L   Alkaline Phosphatase 35 (L) 38 - 126 U/L   Total Bilirubin 0.7 0.3 - 1.2 mg/dL   GFR calc non Af Amer >60 >60 mL/min   GFR calc Af Amer >60 >60 mL/min   Anion gap 14 5 - 15    Comment: Performed at [facility], [address] [place], [place] [address]  CBC     Status: Abnormal   Collection Time: 08/08/20  8:53 PM  Result Value Ref Range   WBC 9.3 4.0 - 10.5 K/uL   RBC  4.78 3.87 - 5.11 MIL/uL   Hemoglobin 15.0 12.0 - 15.0 g/dL   HCT 14.7 (H) 36 - 46 %   MCV 97.5 80.0 - 100.0 fL   MCH 31.4 26.0 - 34.0 pg   MCHC 32.2 30.0 - 36.0 g/dL   RDW 82.9 56.2 - 13.0 %   Platelets 218 150 - 400 K/uL   nRBC 0.0 0.0 - 0.2 %    Comment: Performed at [facility], [address] [place], [place] [address]  SARS Coronavirus 2 by RT PCR (hospital order, performed in [facility] hospital lab) Nasopharyngeal Nasopharyngeal Swab     Status: None   Collection Time: 08/09/20  3:02 AM   Specimen: Nasopharyngeal Swab  Result Value Ref Range   SARS Coronavirus 2 NEGATIVE NEGATIVE    Comment: (NOTE) SARS-CoV-2 target nucleic acids are NOT DETECTED.  The SARS-CoV-2 RNA is generally detectable in upper and lower respiratory specimens during the acute phase of infection. The lowest concentration of SARS-CoV-2 viral copies this assay can detect is 250 copies / mL. A negative result does not preclude SARS-CoV-2 infection and should not be used as the sole basis for treatment or other patient management decisions.  A negative result may occur with improper specimen collection / handling, submission of specimen other than nasopharyngeal swab, presence of viral mutation(s) within the areas targeted by this assay, and inadequate number of viral copies (<250 copies / mL). A negative result must be combined with clinical observations, patient history, and epidemiological information.  Fact Sheet for Patients:  BoilerBrush.com.cy  Fact Sheet for Healthcare Providers: https://pope.com/  This test is not yet approved or  cleared by the [place] FDA and has been authorized for detection and/or diagnosis of SARS-CoV-2 by FDA under an Emergency Use Authorization (EUA).  This EUA will remain in effect (meaning this test can be used) for the duration of the COVID-19 declaration under Section 564(b)(1) of the Act, 21 U.S.C. section  360bbb-3(b)(1), unless the authorization is terminated or revoked sooner.  Performed at [facility], [address], [place], [place] [address]     Chemistries  Recent Labs  Lab 08/08/20 2053  NA 136  K 2.8*  CL 95*  CO2 27  GLUCOSE 170*  BUN 14  CREATININE 0.77  CALCIUM 9.7  AST 15  ALT 13  ALKPHOS 35*  BILITOT 0.7   ------------------------------------------------------------------------------------------------------------------  ------------------------------------------------------------------------------------------------------------------ GFR: Estimated Creatinine Clearance: 46.9 mL/min (by C-G formula based on SCr of 0.77 mg/dL). Liver Function Tests: Recent Labs  Lab 08/08/20 2053  AST 15  ALT 13  ALKPHOS 35*  BILITOT 0.7  PROT 8.7*  ALBUMIN 4.6   Recent Labs  Lab 08/08/20 2053  LIPASE 34   No results for input(s): AMMONIA in the last 168 hours. Coagulation Profile: No results for input(s): INR, PROTIME in the last 168 hours. Cardiac Enzymes: No results for input(s): CKTOTAL, CKMB, CKMBINDEX, TROPONINI in the last 168 hours. BNP (last 3 results) No results for input(s): PROBNP in the last 8760 hours. HbA1C: No results for input(s): HGBA1C in the last 72 hours. CBG: No results for input(s): GLUCAP in the last 168 hours. Lipid Profile: No results for input(s): CHOL, HDL, LDLCALC, TRIG, CHOLHDL, LDLDIRECT in the last 72 hours. Thyroid Function Tests: No results for input(s): TSH, T4TOTAL, FREET4, T3FREE, THYROIDAB in the last 72 hours. Anemia Panel: No results for input(s): VITAMINB12, FOLATE, FERRITIN, TIBC, IRON, RETICCTPCT in the last 72 hours.  --------------------------------------------------------------------------------------------------------------- Urine analysis:    Component Value Date/Time   COLORURINE YELLOW 06/14/2018 2348   APPEARANCEUR HAZY (A) 06/14/2018 2348   LABSPEC >1.046 (H) 06/14/2018 2348   PHURINE 5.0 06/14/2018 2348    GLUCOSEU NEGATIVE 06/14/2018 2348   HGBUR SMALL (A) 06/14/2018 2348   BILIRUBINUR NEGATIVE 06/14/2018 2348   KETONESUR NEGATIVE 06/14/2018 2348   PROTEINUR NEGATIVE 06/14/2018 2348   NITRITE NEGATIVE 06/14/2018 2348   LEUKOCYTESUR NEGATIVE 06/14/2018 2348      Imaging Results:    CT Abdomen Pelvis W Contrast  Result Date: 08/09/2020 CLINICAL DATA:  Upper abdominal pain, emesis EXAM: CT ABDOMEN AND PELVIS WITH CONTRAST TECHNIQUE: Multidetector CT imaging of the abdomen and pelvis was performed using the standard protocol following bolus administration of intravenous contrast. CONTRAST:  OMNIPAQUE IOHEXOL 300 MG/ML  SOLN COMPARISON:  06/14/2018 FINDINGS: Lower chest: Hypoventilatory changes are seen at the lung bases. Hepatobiliary: Stable 9 mm hypodensity right lobe liver likely a small cyst or hemangioma. The remainder of the liver is unremarkable. The gallbladder is normal. No biliary dilation. Pancreas: Unremarkable. No pancreatic ductal dilatation or surrounding inflammatory changes. Spleen: Normal in size without focal abnormality. Adrenals/Urinary Tract: Multiple bilateral renal cortical cysts are seen, largest in the upper pole right kidney measuring 6.3 cm. No hydronephrosis or nephrolithiasis. Bladder is unremarkable. Stomach/Bowel: Dilated segment of jejunum within the mid abdomen is seen, measuring up to 4 cm in diameter. There is twisting of the central mesentery, compatible with closed loop obstruction and midgut volvulus. No evidence of bowel wall ischemia or pneumatosis. Normal appendix right lower quadrant. Postsurgical  changes from distal colectomy, with left lower quadrant colostomy. Vascular/Lymphatic: Aortic atherosclerosis. No enlarged abdominal or pelvic lymph nodes. Reproductive: Uterus and bilateral adnexa are unremarkable. Other: Small volume ascites within the right upper quadrant and lower pelvis. No free intraperitoneal gas. There is a parastomal hernia within the  left lower quadrant containing fat and a portion of colon. Musculoskeletal: No acute or destructive bony lesions. Reconstructed images demonstrate no additional findings. IMPRESSION: 1. Closed loop small-bowel obstruction involving the mid jejunum, with twisting of the mesenteric vessels consistent with midgut volvulus. No evidence of bowel wall ischemia at this time. Surgical consultation recommended. 2. Small volume ascites. 3. Distal colectomy, with left lower quadrant colostomy and small parastomal hernia. 4.  Aortic Atherosclerosis (ICD10-I70.0). These results were called by telephone at the time of interpretation on 08/09/2020 at 3:00 am to provider IVA KNAPP , who verbally acknowledged these results. Electronically Signed   By: Sharlet Salina M.D.   On: 08/09/2020 03:01   DG Chest Portable 1 View  Result Date: 08/09/2020 CLINICAL DATA:  Nasogastric tube placement EXAM: PORTABLE CHEST 1 VIEW COMPARISON:  06/14/2018 FINDINGS: The enteric tube reaches the stomach with side port near the GE junction. Streaky density at the lung bases. Aortic tortuosity and stable heart size. Calcified right upper lobe nodule. No edema, effusion, or pneumothorax. IMPRESSION: Enteric tube in expected position. Electronically Signed   By: Marnee Spring M.D.   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 Gen surg plans to take to OR early this AM 3. NPO 4. Maintenance fluids 5. Antibiotics started by gen surg 6. Type and screen 7. CXR, UA and EKG pending 8. Class II risk with a 6% 30 day risk of death, MI or cardiac arrest on the  Revised Cardiac Risk Index for Pre-Operative Risk 2. Hypokalemia 1. Replace 2. Trend at noon 3. HTN 1. Start HTN medications tomorrow - NPO for surgery this AM 2. IV hydralazine PRN  3. Continue to monitor 4. Hypothyroid 1. Start synthroid tomorrow - NPO for surgery this  AM 2. Check TSH at next blood draw 5. Hyperglycemia 1. Monitor q8H CBG 2. Check Hgb A1C    DVT Prophylaxis-   Heparin - SCDs   AM Labs Ordered, also please review Full Orders  Family Communication: Admission, patients condition and plan of care including tests being ordered have been discussed with the patient and daughter who indicate understanding and agree with the plan and Code Status.  Code Status:  Full  Admission status: Inpatient :The appropriate admission status for this patient is INPATIENT. Inpatient status is judged to be reasonable and necessary in order to provide the required intensity of service to ensure the patient's safety. The patient's presenting symptoms, physical exam findings, and initial radiographic and laboratory data in the context of their chronic comorbidities is felt to place them at high risk for further clinical deterioration. Furthermore, it is not anticipated that the patient will be medically stable for discharge from the hospital within 2 midnights of admission. The following factors support the admission status of inpatient.     The patient's presenting symptoms include Abdominal pain. The worrisome physical exam findings include Abdominal tenderness The initial radiographic and laboratory data are worrisome because of CT scan shows jejunal volvulus The chronic co-morbidities include LLQ colostomy, hypothyroid, HTN       *  I certify that at the point of admission it is my clinical judgment that the patient will require inpatient hospital care spanning beyond 2 midnights from the point of admission due to high intensity of service, high risk for further deterioration and high frequency of surveillance required.*  Time spent in minutes : 64   Merlene Dante B Zierle-Ghosh DO

## 2020-08-09 NOTE — Transfer of Care (Signed)
Immediate Anesthesia Transfer of Care Note  Patient: Michelle Wall  Procedure(s) Performed: EXPLORATORY LAPAROTOMY, possible bowel resection (N/A Abdomen)  Patient Location: PACU  Anesthesia Type:General  Level of Consciousness: awake, alert  and oriented  Airway & Oxygen Therapy: Patient Spontanous Breathing and Patient connected to face mask oxygen  Post-op Assessment: Report given to RN and Post -op Vital signs reviewed and stable  Post vital signs: Reviewed and stable  Last Vitals:  Vitals Value Taken Time  BP 166/73 08/09/20 0833  Temp    Pulse 91   Resp 15 08/09/20 0837  SpO2 98%   Vitals shown include unvalidated device data.  Last Pain:  Vitals:   08/09/20 0533  TempSrc:   PainSc: 2          Complications: No complications documented.

## 2020-08-09 NOTE — Plan of Care (Signed)

## 2020-08-09 NOTE — ED Notes (Signed)
Pt sats dropped to 84% on RA. Pt states she uses 2L O2 prn at home. Placed on 2L [place]. Sats increased to 95%.

## 2020-08-09 NOTE — Progress Notes (Signed)
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 Nausea and vomiting and no colostomy output. Ng now, Covid now, IVF now, Hospitalist to admit. No mesenteric edema or pneumatosis on Ct concerning for ischemia. Will got to OR this AM.  Michelle Wall

## 2020-08-09 NOTE — Progress Notes (Signed)
Patient admitted to the hospital earlier this morning by Dr. Carren Rang  Patient seen and examined. Denies any significant abdominal pain. She is not passing gas. No nausea or vomiting. NG tube in place.  Assessment/plan:  1. Small bowel volvulus. Seen by general surgery and underwent exploratory laparotomy and lysis of adhesions with reduction of volvulus. Currently has NG tube in place. General surgery for further postoperative care.  2. Hypokalemia. Replace. Check magnesium  3. Hypothyroidism. Resume Synthroid once able to take n.p.o.  4. Hyperglycemia. A1c 5.2. Possible stress response.  5. Hypertension. Currently n.p.o., use IV hydralazine as needed.  [employer]

## 2020-08-09 NOTE — ED Provider Notes (Signed)
[facility] EMERGENCY DEPARTMENT Provider Note   CSN: [id number] Arrival date & time: 08/08/20  1958   Time seen 11:50 PM  History Chief Complaint  Patient presents with  . Abdominal Pain    Michelle Wall is a 86 y.o. female.  HPI   Patient is here for her daughter.  She states the pain started gradually and got worse.  She states the pain is in her upper abdomen.  She has had nausea with vomiting twice.  She has a colostomy and has not had any output since this morning.  That seems to not be her typical pattern.  She has had constipation in the past and had to take MiraLAX.  She denies any fever.  She states she has not had this pain before.  She denies any change in her diet or being around anybody else who is sick.  PCP Mirna Mires, MD   Past Medical History:  Diagnosis Date  . Carotid atherosclerosis   . Cor pulmonale (HCC)   . Emphysema   . Hypertension   . Hypothyroidism   . Pulmonary hypertension (HCC)    moderate  last 2D echo EF greater than 55%  mild to moderate tricuspid insufficiency  . Tricuspid insufficiency    moderate to severe    Patient Active Problem List   Diagnosis Date Noted  . SBO (small bowel obstruction) (HCC) 08/09/2020  . Perforation of sigmoid colon (HCC) 10/11/2018  . Encounter for screening colonoscopy 10/11/2018  . Colostomy in [facility]) 10/04/2018  . Sepsis (HCC) 06/14/2018  . Hypothyroid 06/14/2018  . Perforated sigmoid colon (HCC)   . Tobacco abuse 05/16/2013  . Essential hypertension 05/16/2013  . COPD (chronic obstructive pulmonary disease) (HCC) 05/16/2013  . Pulmonary HTN (HCC) 05/16/2013  . Moderate tricuspid insufficiency 05/16/2013    Past Surgical History:  Procedure Laterality Date  . COLONOSCOPY N/A 12/14/2018   Procedure: COLONOSCOPY;  Surgeon: Malissa Hippo, MD;  Location: [facility] ENDO SU[facility];  Service: Endoscopy;  Laterality: N/A;  1:00  . COLOSTOMY Left 06/14/2018   Procedure: COLOSTOMY;  Surgeon: Lucretia Roers, MD;  Location: [facility] ORS;  Service: General;  Laterality: Left;  . PARTIAL COLECTOMY N/A 06/14/2018   Procedure: PARTIAL COLECTOMY;  Surgeon: Lucretia Roers, MD;  Location: [facility] ORS;  Service: General;  Laterality: N/A;  . POLYPECTOMY  12/14/2018   Procedure: POLYPECTOMY;  Surgeon: Malissa Hippo, MD;  Location: [facility] ENDO SUITE;  Service: Endoscopy;;  rectum  . THYROIDECTOMY, PARTIAL    . TUBAL LIGATION     midline scar ? possibly tubal ligation     OB History    Gravida  8   Para  8   Term  8   Preterm      AB      Living  7     SAB      TAB      Ectopic      Multiple      Live Births              Family History  Problem Relation Age of Onset  . Cancer Other     Social History   Tobacco Use  . Smoking status: Current Every Day Smoker    Packs/day: 0.50    Years: 60.00    Pack years: 30.00    Types: Cigarettes  . Smokeless tobacco: Never Used  Vaping Use  . Vaping Use: Never used  Substance Use Topics  . Alcohol  use: No  . Drug use: No  lives at home  Home Medications Prior to Admission medications   Medication Sig Start Date End Date Taking? Authorizing Provider  aspirin EC 81 MG tablet Take 1 tablet (81 mg total) by mouth daily. 12/15/18   Malissa Hippo, MD  cholecalciferol (VITAMIN D3) 25 MCG (1000 UT) tablet Take 1,000 Units by mouth daily.    [provider]  docusate sodium (COLACE) 100 MG capsule Take 1 capsule (100 mg total) by mouth 2 (two) times daily. 01/17/19   Lucretia Roers, MD  DOK 100 MG capsule TAKE 1 CAPSULE(100 MG) BY MOUTH TWICE DAILY 01/17/19   Lucretia Roers, MD  hydrochlorothiazide (HYDRODIURIL) 25 MG tablet Take 25 mg by mouth daily.     [provider]  latanoprost (XALATAN) 0.005 % ophthalmic solution Place 1 drop into both eyes at bedtime. 04/17/14   [provider]  SYNTHROID 100 MCG tablet Take 100 mcg by mouth daily before breakfast.  03/22/18   [provider]    verapamil (CALAN-SR) 240 MG CR tablet Take 240 mg by mouth daily.     [provider]    Allergies    Patient has no known allergies.  Review of Systems   Review of Systems  All other systems reviewed and are negative.   Physical Exam Updated Vital Signs BP (!) 172/85   Pulse 98   Temp 98.7 F (37.1 C) (Oral)   Resp 18   Ht 5\' 3"  (1.6 m)   Wt 65.8 kg   SpO2 93%   BMI 25.69 kg/m   Physical Exam Vitals and nursing note reviewed.  Constitutional:      General: She is not in acute distress.    Appearance: Normal appearance. She is normal weight.  HENT:     Head: Normocephalic and atraumatic.     Right Ear: External ear normal.     Left Ear: External ear normal.  Eyes:     Extraocular Movements: Extraocular movements intact.     Conjunctiva/sclera: Conjunctivae normal.  Cardiovascular:     Rate and Rhythm: Normal rate and regular rhythm.     Pulses: Normal pulses.     Heart sounds: No murmur heard.   Pulmonary:     Effort: Pulmonary effort is normal. No respiratory distress.     Breath sounds: Normal breath sounds.  Abdominal:     General: Bowel sounds are absent.     Tenderness: There is abdominal tenderness. There is no guarding or rebound.     Comments: No output was noted in her colostomy bag.  There is also no air in the bag.  Musculoskeletal:        General: No deformity.     Cervical back: Normal range of motion.  Skin:    General: Skin is warm and dry.  Neurological:     General: No focal deficit present.     Mental Status: She is alert and oriented to person, place, and time.     Cranial Nerves: No cranial nerve deficit.  Psychiatric:        Mood and Affect: Mood normal.        Behavior: Behavior normal.        Thought Content: Thought content normal.     ED Results / Procedures / Treatments   Labs (all labs ordered are listed, but only abnormal results are displayed) Results for orders placed or performed during the hospital encounter  of 08/08/20  SARS Coronavirus 2 by RT PCR (hospital order, performed in [facility] hospital lab) Nasopharyngeal Nasopharyngeal Swab   Specimen: Nasopharyngeal Swab  Result Value Ref Range   SARS Coronavirus 2 NEGATIVE NEGATIVE  Lipase, blood  Result Value Ref Range   Lipase 34 11 - 51 U/L  Comprehensive metabolic panel  Result Value Ref Range   Sodium 136 135 - 145 mmol/L   Potassium 2.8 (L) 3.5 - 5.1 mmol/L   Chloride 95 (L) 98 - 111 mmol/L   CO2 27 22 - 32 mmol/L   Glucose, Bld 170 (H) 70 - 99 mg/dL   BUN 14 8 - 23 mg/dL   Creatinine, Ser 1.61 0.44 - 1.00 mg/dL   Calcium 9.7 8.9 - 09.6 mg/dL   Total Protein 8.7 (H) 6.5 - 8.1 g/dL   Albumin 4.6 3.5 - 5.0 g/dL   AST 15 15 - 41 U/L   ALT 13 0 - 44 U/L   Alkaline Phosphatase 35 (L) 38 - 126 U/L   Total Bilirubin 0.7 0.3 - 1.2 mg/dL   GFR calc non Af Amer >60 >60 mL/min   GFR calc Af Amer >60 >60 mL/min   Anion gap 14 5 - 15  CBC  Result Value Ref Range   WBC 9.3 4.0 - 10.5 K/uL   RBC 4.78 3.87 - 5.11 MIL/uL   Hemoglobin 15.0 12.0 - 15.0 g/dL   HCT 04.5 (H) 36 - 46 %   MCV 97.5 80.0 - 100.0 fL   MCH 31.4 26.0 - 34.0 pg   MCHC 32.2 30.0 - 36.0 g/dL   RDW 40.9 81.1 - 91.4 %   Platelets 218 150 - 400 K/uL   nRBC 0.0 0.0 - 0.2 %   Laboratory interpretation all normal except hypokalemia,hyperglycemia    EKG None  Radiology CT Abdomen Pelvis W Contrast  Result Date: 08/09/2020 CLINICAL DATA:  Upper abdominal pain, emesis EXAM: CT ABDOMEN AND PELVIS WITH CONTRAST TECHNIQUE: Multidetector CT imaging of the abdomen and pelvis was performed using the standard protocol following bolus administration of intravenous contrast. CONTRAST:  OMNIPAQUE IOHEXOL 300 MG/ML  SOLN COMPARISON:  06/14/2018 FINDINGS: Lower chest: Hypoventilatory changes are seen at the lung bases. Hepatobiliary: Stable 9 mm hypodensity right lobe liver likely a small cyst or hemangioma. The remainder of the liver is unremarkable. The gallbladder is normal.  No biliary dilation. Pancreas: Unremarkable. No pancreatic ductal dilatation or surrounding inflammatory changes. Spleen: Normal in size without focal abnormality. Adrenals/Urinary Tract: Multiple bilateral renal cortical cysts are seen, largest in the upper pole right kidney measuring 6.3 cm. No hydronephrosis or nephrolithiasis. Bladder is unremarkable. Stomach/Bowel: Dilated segment of jejunum within the mid abdomen is seen, measuring up to 4 cm in diameter. There is twisting of the central mesentery, compatible with closed loop obstruction and midgut volvulus. No evidence of bowel wall ischemia or pneumatosis. Normal appendix right lower quadrant. Postsurgical changes from distal colectomy, with left lower quadrant colostomy. Vascular/Lymphatic: Aortic atherosclerosis. No enlarged abdominal or pelvic lymph nodes. Reproductive: Uterus and bilateral adnexa are unremarkable. Other: Small volume ascites within the right upper quadrant and lower pelvis. No free intraperitoneal gas. There is a parastomal hernia within the left lower quadrant containing fat and a portion of colon. Musculoskeletal: No acute or destructive bony lesions. Reconstructed images demonstrate no additional findings. IMPRESSION: 1. Closed loop small-bowel obstruction involving the mid jejunum, with twisting of the mesenteric vessels consistent with midgut volvulus. No evidence of bowel wall ischemia at this time. Surgical consultation  recommended. 2. Small volume ascites. 3. Distal colectomy, with left lower quadrant colostomy and small parastomal hernia. 4.  Aortic Atherosclerosis (ICD10-I70.0). These results were called by telephone at the time of interpretation on 08/09/2020 at 3:00 am to provider Crystina Borrayo , who verbally acknowledged these results. Electronically Signed   By: Sharlet SalinaMichael  Brown M.D.   On: 08/09/2020 03:01    Procedures .Critical Care Performed by: Devoria AlbeKnapp, Kateri Balch, MD Authorized by: Devoria AlbeKnapp, Carly Applegate, MD   Critical care provider  statement:    Critical care time (minutes):  41   Critical care was necessary to treat or prevent imminent or life-threatening deterioration of the following conditions: Intestinal compromise.   Critical care was time spent personally by me on the following activities:  Discussions with consultants, examination of patient, obtaining history from patient or surrogate, ordering and review of laboratory studies, ordering and review of radiographic studies, pulse oximetry, re-evaluation of patient's condition and review of old charts   (including critical care time)  Medications Ordered in ED Medications  sodium chloride 0.9 % bolus 500 mL (0 mLs Intravenous Stopped 08/09/20 0138)  fentaNYL (SUBLIMAZE) injection 50 mcg (50 mcg Intravenous Given 08/09/20 0046)  ondansetron (ZOFRAN) injection 4 mg (4 mg Intravenous Given 08/09/20 0046)  iohexol (OMNIPAQUE) 300 MG/ML solution 100 mL (100 mLs Intravenous Contrast Given 08/09/20 0226)  fentaNYL (SUBLIMAZE) injection 50 mcg (50 mcg Intravenous Given 08/09/20 0259)  ondansetron (ZOFRAN) injection 4 mg (4 mg Intravenous Given 08/09/20 0335)  metoCLOPramide (REGLAN) injection 5 mg (5 mg Intravenous Given 08/09/20 0423)    ED Course  I have reviewed the triage vital signs and the nursing notes.  Pertinent labs & imaging results that were available during my care of the patient were reviewed by me and considered in my medical decision making (see chart for details).    MDM Rules/Calculators/A&P                         Patient's exam was worrisome and that she had no bowel sounds and any quadrant.  She does not have guarding.  Her pain is most evident in the epigastric area and then the left upper quadrant.  Concern for vascular compromise is low because the pain is not diffuse.  Concern is for obstruction although you would think she would have increased bowel sounds.  She has some mild hypokalemia maybe she has a ileus.  CT scan was done to further evaluate her  complaints.  3:00 AM radiologist called her CT which shows a volvulus in the mid jejunum without mesentery ischemia yet.  Surgical consult was called.  I talked to the patient and her daughter about her CT results and the need for surgery to treat this twisting of her intestines.  On reexam her abdomen appears to be diffusely distended compared to before.  Covid testing was ordered.  03:12 AM Dr Henreitta LeberBridges, states to have hospitalist admit due to her underlying medical problems, give IV fluids and place NG tube, [facility].  3:25 AM patient noted to be very bradycardic heart rate was in the 40s, when I went in the room nursing staff had just put in her NG tube.  She does complain of nausea.  Her heart rate now is back up to 100's.  She was given Zofran IV.  03:57 AM Dr Carren RangZierle-Ghosh, hospitalist, will admit.   4:20 AM I walked past the room and  the daughter was holding the emesis bag to the mother's/patient's mouth, as if she still nauseated and he said yes.  She was given low-dose Reglan IV.   Final Clinical Impression(s) / ED Diagnoses Final diagnoses:  Small bowel obstruction (HCC)  Volvulus of jejunum (HCC)    Rx / DC Orders  Plan admission  Devoria Albe, MD, Concha Pyo, MD 08/09/20 [phone]

## 2020-08-09 NOTE — Anesthesia Preprocedure Evaluation (Addendum)
Anesthesia Evaluation  Patient identified by MRN, date of birth, ID band Patient awake    Reviewed: Allergy & Precautions, NPO status , Patient's Chart, lab work & pertinent test results  History of Anesthesia Complications Negative for: history of anesthetic complications  Airway Mallampati: III  TM Distance: >3 FB Neck ROM: Full    Dental  (+) Upper Dentures, Edentulous Lower   Pulmonary COPD,  COPD inhaler, Current Smoker and Patient abstained from smoking.,    Pulmonary exam normal breath sounds clear to auscultation       Cardiovascular hypertension, + dysrhythmias (VTACH this morning, metoprolol given) Ventricular Tachycardia + Valvular Problems/Murmurs (TR)  Rhythm:Regular Rate:Tachycardia + Systolic murmurs [id number] 04:44:00 [facility] ROUTINE RECORD Sinus tachycardia Atrial premature complexes in couplets Abnormal R-wave progression, early transition Inferior infarct, old Consider anterior infarct Baseline wander in lead(s) I   Neuro/Psych negative psych ROS   GI/Hepatic Neg liver ROS, Colostomy, Volvulus    Endo/Other  Hypothyroidism   Renal/GU negative Renal ROS     Musculoskeletal   Abdominal   Peds  Hematology   Anesthesia Other Findings Left ventricle: The cavity size was normal. Systolic function was   normal. The estimated ejection fraction was in the range of 60% to   65%. Wall motion was normal; there were no regional wall motion   abnormalities.  - Aortic valve: Mild regurgitation.  - Mitral valve: Mild regurgitation.  - Right ventricle: The cavity size was moderately dilated. Wall   thickness was normal.  - Right atrium: The atrium was mildly dilated.  - Tricuspid valve: Moderate-severe regurgitation.  - Pulmonary arteries: PA peak pressure: 66mm Hg (S).   Reproductive/Obstetrics                            Anesthesia  Physical Anesthesia Plan  ASA: IV and emergent  Anesthesia Plan: General   Post-op Pain Management:    Induction: Intravenous  PONV Risk Score and Plan: Ondansetron and Dexamethasone  Airway Management Planned: Oral ETT  Additional Equipment:   Intra-op Plan:   Post-operative Plan: Possible Post-op intubation/ventilation and Extubation in OR  Informed Consent: I have reviewed the patients History and Physical, chart, labs and discussed the procedure including the risks, benefits and alternatives for the proposed anesthesia with the patient or authorized representative who has indicated his/her understanding and acceptance.     Dental advisory given  Plan Discussed with: CRNA  Anesthesia Plan Comments:       Anesthesia Quick Evaluation

## 2020-08-09 NOTE — Op Note (Signed)
[facility] Operative Note  08/09/20  Preoperative Diagnosis: Small bowel volvulus with obstruction    Postoperative Diagnosis: Same   Procedure(s) Performed: Exploratory laparotomy and lysis of adhesions, reduction of volvulus    Surgeon: Leatrice Jewels. Henreitta Leber, MD   Assistants: No qualified resident was available    Anesthesia: General endotracheal   Anesthesiologist: Molli Barrows, MD    Specimens: None   Estimated Blood Loss: Minimal   Blood Replacement: None    Complications: None   Wound Class: Clean   Operative Indications: Ms. Poth is a 86 yo with prior end colostomy for diverticulitis who came in with abdominal pain and nausea/vomiting and no output from her ostomy. CT scan demonstrated a SBO with volvulus of the jejunum. She had no signs of ischemia at that time. We discussed the risk of exploration including bleeding, infection, need for resection, potential ICU and continued intubation needed. She opted to proceed.   Findings: Multiple adhesions from the small bowel to the anterior abdominal wall and pelvis, with small bowel twisting around an adhesion in the pelvis    Procedure: The patient was taken to the operating room and placed supine. General endotracheal anesthesia was induced. Intravenous antibiotics were administered per protocol.  A nasogastric tube was already positioned to decompress the stomach. The abdomen was prepared and draped in the usual sterile fashion with exclusion of the colostomy bag. A Ioban was used.   A midline incision was made over her prior scar and carried down through to the fascia where I carefully elevated the fashion and entered with Metzenbaum scissors. There was adhesion to the anterior abdominal wall and small bowel but I was able to get these down with scissor dissection. The bowel was hyperemic with signs of some ischemia and was twisted. Adhesions in the pelvis to the distal decompressed small bowel were  taken down with scissors, and with careful manipulation the small intestine was eviscerated and the volvulus portion was straighten out. The bowel was allowed to declare it self while interloop adhesions were taken down. The bowl involved in the volvulus was dilated hyperemic at first but became pink and was viable.  The bowel was ran several times and decompressed. A few minor serosal tears were closed with Lembert 3-0 Interrupted sutures. There were no enterotomies. NG placement was confirmed in the mid stomach. Irrigation was performed. Final inspection revealed acceptable hemostasis.   The abdomen was closed with 0 PDS suture in the standard fashion. The skin was closed with staples. All counts were correct at the end of the case. The patient was awakened from anesthesia and extubated without complication.  The patient went to the PACU in stable condition.   Algis Greenhouse, MD [facility] [address] [place], [place] [address] [phone] (office)

## 2020-08-09 NOTE — Anesthesia Procedure Notes (Signed)
Procedure Name: Intubation Date/Time: 08/09/2020 7:08 AM Performed by: Genelle Bal, CRNA Pre-anesthesia Checklist: Patient identified, Emergency Drugs available, Suction available and Patient being monitored Patient Re-evaluated:Patient Re-evaluated prior to induction Oxygen Delivery Method: Circle system utilized Preoxygenation: Pre-oxygenation with 100% oxygen Induction Type: IV induction, Rapid sequence and Cricoid Pressure applied Laryngoscope Size: Mac and 3 Grade View: Grade I Tube type: Oral Tube size: 7.0 mm Number of attempts: 1 Airway Equipment and Method: Stylet and Oral airway Placement Confirmation: ETT inserted through vocal cords under direct vision,  positive ETCO2 and breath sounds checked- equal and bilateral Secured at: 20 cm Tube secured with: Tape Dental Injury: Teeth and Oropharynx as per pre-operative assessment

## 2020-08-09 NOTE — Progress Notes (Signed)
[facility]  Patient's daughter notified. No bowel resected.  NPO NG, cardiac monitoring.   Algis Greenhouse, MD [facility] [address] [place], [place] [address] [phone] (office)

## 2020-08-09 NOTE — ED Notes (Addendum)
Pt ran a 39 beat run of V-tach at 0504. Dr. Carren Rang made aware.

## 2020-08-10 DIAGNOSIS — I471 Supraventricular tachycardia: Secondary | ICD-10-CM

## 2020-08-10 DIAGNOSIS — D62 Acute posthemorrhagic anemia: Secondary | ICD-10-CM

## 2020-08-10 DIAGNOSIS — E039 Hypothyroidism, unspecified: Secondary | ICD-10-CM

## 2020-08-10 DIAGNOSIS — E876 Hypokalemia: Secondary | ICD-10-CM

## 2020-08-10 LAB — OCCULT BLOOD X 1 CARD TO LAB, STOOL: Fecal Occult Bld: POSITIVE — AB

## 2020-08-10 LAB — BASIC METABOLIC PANEL
Anion gap: 8 (ref 5–15)
BUN: 25 mg/dL — ABNORMAL HIGH (ref 8–23)
CO2: 29 mmol/L (ref 22–32)
Calcium: 8.2 mg/dL — ABNORMAL LOW (ref 8.9–10.3)
Chloride: 103 mmol/L (ref 98–111)
Creatinine, Ser: 0.76 mg/dL (ref 0.44–1.00)
GFR calc Af Amer: >60 mL/min (ref >60)
GFR calc non Af Amer: >60 mL/min (ref >60)
Glucose, Bld: 138 mg/dL — ABNORMAL HIGH (ref 70–99)
Potassium: 3.6 mmol/L (ref 3.5–5.1)
Sodium: 140 mmol/L (ref 135–145)

## 2020-08-10 LAB — CBC WITH DIFFERENTIAL/PLATELET
Abs Immature Granulocytes: 0.06 10*3/uL (ref 0.00–0.07)
Basophils Absolute: 0 10*3/uL (ref 0.0–0.1)
Basophils Relative: 0 %
Eosinophils Absolute: 0 10*3/uL (ref 0.0–0.5)
Eosinophils Relative: 0 %
HCT: 36.3 % (ref 36.0–46.0)
Hemoglobin: 11.8 g/dL — ABNORMAL LOW (ref 12.0–15.0)
Immature Granulocytes: 0 %
Lymphocytes Relative: 8 %
Lymphs Abs: 1.2 10*3/uL (ref 0.7–4.0)
MCH: 31.7 pg (ref 26.0–34.0)
MCHC: 32.5 g/dL (ref 30.0–36.0)
MCV: 97.6 fL (ref 80.0–100.0)
Monocytes Absolute: 1.3 10*3/uL — ABNORMAL HIGH (ref 0.1–1.0)
Monocytes Relative: 9 %
Neutro Abs: 12.3 10*3/uL — ABNORMAL HIGH (ref 1.7–7.7)
Neutrophils Relative %: 83 %
Platelets: 213 10*3/uL (ref 150–400)
RBC: 3.72 MIL/uL — ABNORMAL LOW (ref 3.87–5.11)
RDW: 13.7 % (ref 11.5–15.5)
WBC: 14.8 10*3/uL — ABNORMAL HIGH (ref 4.0–10.5)
nRBC: 0 % (ref 0.0–0.2)

## 2020-08-10 LAB — CBC
HCT: 33.2 % — ABNORMAL LOW (ref 36.0–46.0)
Hemoglobin: 10.6 g/dL — ABNORMAL LOW (ref 12.0–15.0)
MCH: 31.7 pg (ref 26.0–34.0)
MCHC: 31.9 g/dL (ref 30.0–36.0)
MCV: 99.4 fL (ref 80.0–100.0)
Platelets: 185 10*3/uL (ref 150–400)
RBC: 3.34 MIL/uL — ABNORMAL LOW (ref 3.87–5.11)
RDW: 13.8 % (ref 11.5–15.5)
WBC: 13.7 10*3/uL — ABNORMAL HIGH (ref 4.0–10.5)
nRBC: 0 % (ref 0.0–0.2)

## 2020-08-10 LAB — GLUCOSE, CAPILLARY
Glucose-Capillary: 102 mg/dL — ABNORMAL HIGH (ref 70–99)
Glucose-Capillary: 121 mg/dL — ABNORMAL HIGH (ref 70–99)
Glucose-Capillary: 126 mg/dL — ABNORMAL HIGH (ref 70–99)
Glucose-Capillary: 139 mg/dL — ABNORMAL HIGH (ref 70–99)
Glucose-Capillary: 143 mg/dL — ABNORMAL HIGH (ref 70–99)

## 2020-08-10 LAB — PHOSPHORUS: Phosphorus: 2.2 mg/dL — ABNORMAL LOW (ref 2.5–4.6)

## 2020-08-10 MED ORDER — MENTHOL 3 MG MT LOZG
1.0000 | LOZENGE | OROMUCOSAL | Status: DC | PRN
  Administered 2020-08-11: 3 mg via ORAL
  Filled 2020-08-10: qty 9

## 2020-08-10 MED ORDER — PANTOPRAZOLE SODIUM 40 MG IV SOLR
40.0000 mg | Freq: Two times a day (BID) | INTRAVENOUS | Status: DC
  Administered 2020-08-10 – 2020-08-17 (×15): 40 mg via INTRAVENOUS
  Filled 2020-08-10 (×15): qty 40

## 2020-08-10 MED ORDER — METOPROLOL TARTRATE 5 MG/5ML IV SOLN
5.0000 mg | Freq: Four times a day (QID) | INTRAVENOUS | Status: DC
  Administered 2020-08-10 – 2020-08-19 (×34): 5 mg via INTRAVENOUS
  Filled 2020-08-10 (×35): qty 5

## 2020-08-10 MED ORDER — DILTIAZEM HCL 25 MG/5ML IV SOLN
20.0000 mg | Freq: Once | INTRAVENOUS | Status: AC
  Administered 2020-08-10: 10 mg via INTRAVENOUS
  Filled 2020-08-10: qty 5

## 2020-08-10 NOTE — Plan of Care (Signed)

## 2020-08-10 NOTE — Progress Notes (Signed)
Called to the patients room, patient had some red-tinged sputum.  NGT contents also has a red cast in the tubing.  Abdomen is soft and dressing is clean and intact.  On-call Md notified.  No new orders at this time. Will continue to monitor.

## 2020-08-10 NOTE — Progress Notes (Signed)
PROGRESS NOTE    Michelle Wall  UEA:[id number] DOB: [birth year] DOA: 08/08/2020 PCP: Mirna Mires, MD    Brief Narrative:  Michelle Wall  is a 86 y.o. 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  Patient reports that the pain started at 4:30 PM on 08/08/2020.  She reports it was gradual in onset, but eventually became severe.  The pain is located across both upper quadrants.  It does not radiate.  Is been constant since it started.  It feels like crampy and sharp.  Patient has associated emesis 3 times that was nonbloody.  She reports that her last normal bowel movement was the morning of 08/08/2020.  She had a normal appetite and felt normal throughout the day until the pain started at 4:30 PM.  Nothing has made the pain better.  Palpation makes it worse.  Patient denies any associated fever, chest pain, palpitations, shortness of breath.  Patient is a current smoker and smokes a pack a day.  She declines nicotine patch at this time.  Of note approximately 2 years ago patient had perforated bowel, with colectomy and left lower quadrant colostomy.  Patient reports no bloody or abnormal output in her colostomy bag.  In the ED Temperature 98.7, heart rate 92, respiratory rate 14, blood pressure 163/76, saturating 100% No leukocytosis with a white blood cell count of 9.3 CHEM panel reveals hypokalemia at 2.8, hyperglycemia at 170 Gap 14 Lipase 34 Covid test pending CT abdomen pelvis shows closed-loop small bowel obstruction involving the mid jejunum with twisting of the mesenteric vessels consistent with midgut volvulus Cefotetan, fentanyl, Zofran x2, 20 mEq of potassium given in the ED Chest x-ray pending   Assessment & Plan:   Principal Problem:   Volvulus of jejunum (HCC) Active Problems:   Small  bowel obstruction (HCC)   1. Small bowel volvulus. Seen by general surgery and underwent exploratory laparotomy and lysis of adhesions with reduction of volvulus. Currently has NG tube in place. Await return of bowel function. General surgery for further postoperative care.  2. Hypokalemia. Replace. Check magnesium  3. Hypothyroidism. Resume Synthroid once able to take p.o.  4. Hyperglycemia. A1c 5.2. Possible stress response.  5. Hypertension. Currently n.p.o., use IV hydralazine as needed.  6. SVT.  Patient became tachycardic with a heart rate in the 170s earlier today.  She received IV metoprolol and Cardizem with improvement of heart rate.  We will continue on IV metoprolol until she is able to take oral verapamil again.  7.  Anemia, possibly related to GI blood loss.  Noted to have dark-colored output from NG tube.  May be related to NG tube trauma.  Start on Protonix.  Continue to follow serial hemoglobins.  Consider transfusion for hemoglobin less than 7.     DVT prophylaxis: SCDs Start: 08/09/20 0436 SCD's Start: 08/09/20 0355  Code Status: Full code Family Communication: Discussed with daughter at the bedside Disposition Plan: Status is: Inpatient  Remains inpatient appropriate because:Inpatient level of care appropriate due to severity of illness   Dispo: The patient is from: Home              Anticipated d/c is to: Home              Anticipated d/c date is: 2 days  Patient currently is not medically stable to d/c.    Consultants:   General surgery  Procedures:   9/17 exploratory laparotomy with lysis of adhesions  Antimicrobials:      Subjective: Denies any chest pain or shortness of breath.  Denies any palpitations.  Was noted to be in rapid atrial flutter/SVT earlier this morning.  NG tube in place with dark fluid noted in canister.  Objective: Vitals:   08/10/20 0505 08/10/20 0936 08/10/20 1348 08/10/20 1511  BP: (!) 147/65 (!)  112/55 (!) 126/57 (!) 133/54  Pulse: (!) 106 (!) 136 98 93  Resp: 16   19  Temp: 98.8 F (37.1 C)   (!) 97.5 F (36.4 C)  TempSrc: Oral     SpO2: 100%   99%  Weight:      Height:        Intake/Output Summary (Last 24 hours) at 08/10/2020 1903 Last data filed at 08/10/2020 1400 Gross per 24 hour  Intake 1458.94 ml  Output 1300 ml  Net 158.94 ml   Filed Weights   08/08/20 2014 08/10/20 0500  Weight: 65.8 kg 66.3 kg    Examination:  General exam: Appears calm and comfortable  Respiratory system: Clear to auscultation. Respiratory effort normal. Cardiovascular system: S1 & S2 heard, RRR. No JVD, murmurs, rubs, gallops or clicks. No pedal edema. Gastrointestinal system: Abdomen is nondistended, soft and nontender. No organomegaly or masses felt. Normal bowel sounds heard. Central nervous system: Alert and oriented. No focal neurological deficits. Extremities: Symmetric 5 x 5 power. Skin: No rashes, lesions or ulcers Psychiatry: Judgement and insight appear normal. Mood & affect appropriate.     Data Reviewed: I have personally reviewed following labs and imaging studies  CBC: Recent Labs  Lab 08/08/20 2053 08/09/20 0943 08/10/20 0604 08/10/20 1605  WBC 9.3 21.0* 14.8* 13.7*  NEUTROABS  --  19.5* 12.3*  --   HGB 15.0 15.7* 11.8* 10.6*  HCT 46.6* 49.6* 36.3 33.2*  MCV 97.5 100.0 97.6 99.4  PLT 218 255 213 185   Basic Metabolic Panel: Recent Labs  Lab 08/08/20 2053 08/09/20 0943 08/10/20 0604  NA 136 137 140  K 2.8* 3.7 3.6  CL 95* 99 103  CO2 27 26 29   GLUCOSE 170* 149* 138*  BUN 14 15 25*  CREATININE 0.77 0.87 0.76  CALCIUM 9.7 8.5* 8.2*  MG  --  2.1  --   PHOS  --   --  2.2*   GFR: Estimated Creatinine Clearance: 47.1 mL/min (by C-G formula based on SCr of 0.76 mg/dL). Liver Function Tests: Recent Labs  Lab 08/08/20 2053  AST 15  ALT 13  ALKPHOS 35*  BILITOT 0.7  PROT 8.7*  ALBUMIN 4.6   Recent Labs  Lab 08/08/20 2053  LIPASE 34   No  results for input(s): AMMONIA in the last 168 hours. Coagulation Profile: No results for input(s): INR, PROTIME in the last 168 hours. Cardiac Enzymes: No results for input(s): CKTOTAL, CKMB, CKMBINDEX, TROPONINI in the last 168 hours. BNP (last 3 results) No results for input(s): PROBNP in the last 8760 hours. HbA1C: Recent Labs    08/08/20 2053  HGBA1C 5.2   CBG: Recent Labs  Lab 08/09/20 2023 08/10/20 0012 08/10/20 0422 08/10/20 0808 08/10/20 1633  GLUCAP 145* 143* 139* 126* 121*   Lipid Profile: No results for input(s): CHOL, HDL, LDLCALC, TRIG, CHOLHDL, LDLDIRECT in the last 72 hours. Thyroid Function Tests: Recent Labs    08/08/20 2053  TSH 0.777  Anemia Panel: No results for input(s): VITAMINB12, FOLATE, FERRITIN, TIBC, IRON, RETICCTPCT in the last 72 hours. Sepsis Labs: No results for input(s): PROCALCITON, LATICACIDVEN in the last 168 hours.  Recent Results (from the past 240 hour(s))  SARS Coronavirus 2 by RT PCR (hospital order, performed in [facility] hospital lab) Nasopharyngeal Nasopharyngeal Swab     Status: None   Collection Time: 08/09/20  3:02 AM   Specimen: Nasopharyngeal Swab  Result Value Ref Range Status   SARS Coronavirus 2 NEGATIVE NEGATIVE Final    Comment: (NOTE) SARS-CoV-2 target nucleic acids are NOT DETECTED.  The SARS-CoV-2 RNA is generally detectable in upper and lower respiratory specimens during the acute phase of infection. The lowest concentration of SARS-CoV-2 viral copies this assay can detect is 250 copies / mL. A negative result does not preclude SARS-CoV-2 infection and should not be used as the sole basis for treatment or other patient management decisions.  A negative result may occur with improper specimen collection / handling, submission of specimen other than nasopharyngeal swab, presence of viral mutation(s) within the areas targeted by this assay, and inadequate number of viral copies (<250 copies / mL). A  negative result must be combined with clinical observations, patient history, and epidemiological information.  Fact Sheet for Patients:   BoilerBrush.com.cy  Fact Sheet for Healthcare Providers: https://pope.com/  This test is not yet approved or  cleared by the [place] FDA and has been authorized for detection and/or diagnosis of SARS-CoV-2 by FDA under an Emergency Use Authorization (EUA).  This EUA will remain in effect (meaning this test can be used) for the duration of the COVID-19 declaration under Section 564(b)(1) of the Act, 21 U.S.C. section 360bbb-3(b)(1), unless the authorization is terminated or revoked sooner.  Performed at [facility], [address], [place], [place] [address]          Radiology Studies: CT Abdomen Pelvis W Contrast  Result Date: 08/09/2020 CLINICAL DATA:  Upper abdominal pain, emesis EXAM: CT ABDOMEN AND PELVIS WITH CONTRAST TECHNIQUE: Multidetector CT imaging of the abdomen and pelvis was performed using the standard protocol following bolus administration of intravenous contrast. CONTRAST:  OMNIPAQUE IOHEXOL 300 MG/ML  SOLN COMPARISON:  06/14/2018 FINDINGS: Lower chest: Hypoventilatory changes are seen at the lung bases. Hepatobiliary: Stable 9 mm hypodensity right lobe liver likely a small cyst or hemangioma. The remainder of the liver is unremarkable. The gallbladder is normal. No biliary dilation. Pancreas: Unremarkable. No pancreatic ductal dilatation or surrounding inflammatory changes. Spleen: Normal in size without focal abnormality. Adrenals/Urinary Tract: Multiple bilateral renal cortical cysts are seen, largest in the upper pole right kidney measuring 6.3 cm. No hydronephrosis or nephrolithiasis. Bladder is unremarkable. Stomach/Bowel: Dilated segment of jejunum within the mid abdomen is seen, measuring up to 4 cm in diameter. There is twisting of the central mesentery, compatible  with closed loop obstruction and midgut volvulus. No evidence of bowel wall ischemia or pneumatosis. Normal appendix right lower quadrant. Postsurgical changes from distal colectomy, with left lower quadrant colostomy. Vascular/Lymphatic: Aortic atherosclerosis. No enlarged abdominal or pelvic lymph nodes. Reproductive: Uterus and bilateral adnexa are unremarkable. Other: Small volume ascites within the right upper quadrant and lower pelvis. No free intraperitoneal gas. There is a parastomal hernia within the left lower quadrant containing fat and a portion of colon. Musculoskeletal: No acute or destructive bony lesions. Reconstructed images demonstrate no additional findings. IMPRESSION: 1. Closed loop small-bowel obstruction involving the mid jejunum, with twisting of the mesenteric vessels consistent with  midgut volvulus. No evidence of bowel wall ischemia at this time. Surgical consultation recommended. 2. Small volume ascites. 3. Distal colectomy, with left lower quadrant colostomy and small parastomal hernia. 4.  Aortic Atherosclerosis (ICD10-I70.0). These results were called by telephone at the time of interpretation on 08/09/2020 at 3:00 am to provider IVA KNAPP , who verbally acknowledged these results. Electronically Signed   By: Sharlet Salina M.D.   On: 08/09/2020 03:01   DG Chest Portable 1 View  Result Date: 08/09/2020 CLINICAL DATA:  Nasogastric tube placement EXAM: PORTABLE CHEST 1 VIEW COMPARISON:  06/14/2018 FINDINGS: The enteric tube reaches the stomach with side port near the GE junction. Streaky density at the lung bases. Aortic tortuosity and stable heart size. Calcified right upper lobe nodule. No edema, effusion, or pneumothorax. IMPRESSION: Enteric tube in expected position. Electronically Signed   By: Marnee Spring M.D.   On: 08/09/2020 04:15        Scheduled Meds: . Chlorhexidine Gluconate Cloth  6 each Topical Daily  . influenza vaccine adjuvanted  0.5 mL Intramuscular  Tomorrow-1000  . metoprolol tartrate  5 mg Intravenous Q6H  . pantoprazole (PROTONIX) IV  40 mg Intravenous Q12H   Continuous Infusions: . sodium chloride 75 mL/hr at 08/10/20 1340  . lactated ringers       LOS: 1 day    Time spent:    Erick Blinks, MD [facility]   If 7PM-7AM, please contact night-coverage [email or web address]  08/10/2020, 7:03 PM

## 2020-08-10 NOTE — Progress Notes (Signed)
[facility] Progress Note  1 Day Post-Op  Subjective: Feeling better. Improving overall. NG with about 500 dark fluid. No gas in bag. Had some SVT/ Afib last night. Improved now. H&H drifting down.   Objective: Vital signs in last 24 hours: Temp:  [98.8 F (37.1 C)-99.8 F (37.7 C)] 98.8 F (37.1 C) (09/18 0505) Pulse Rate:  [90-136] 136 (09/18 0936) Resp:  [16] 16 (09/18 0505) BP: (112-160)/(55-72) 112/55 (09/18 0936) SpO2:  [97 %-100 %] 100 % (09/18 0505) Weight:  [66.3 kg] 66.3 kg (09/18 0500) Last BM Date: 08/06/20  Intake/Output from previous day: 09/17 0701 - 09/18 0700 In: 4758.9 [I.V.:4658.9; IV Piggyback:100] Out: 895 [Urine:345; Emesis/NG output:300; Blood:100] Intake/Output this shift: No intake/output data recorded.  General appearance: alert, cooperative and no distress Resp: normal work of breathing GI: soft, distended, no gas in ostomy bag, midline with honeycomb dressing, no erythema or drainage  Lab Results:  Recent Labs    08/09/20 0943 08/10/20 0604  WBC 21.0* 14.8*  HGB 15.7* 11.8*  HCT 49.6* 36.3  PLT 255 213   BMET Recent Labs    08/09/20 0943 08/10/20 0604  NA 137 140  K 3.7 3.6  CL 99 103  CO2 26 29  GLUCOSE 149* 138*  BUN 15 25*  CREATININE 0.87 0.76  CALCIUM 8.5* 8.2*   PT/INR No results for input(s): LABPROT, INR in the last 72 hours.  Studies/Results: CT Abdomen Pelvis W Contrast  Result Date: 08/09/2020 CLINICAL DATA:  Upper abdominal pain, emesis EXAM: CT ABDOMEN AND PELVIS WITH CONTRAST TECHNIQUE: Multidetector CT imaging of the abdomen and pelvis was performed using the standard protocol following bolus administration of intravenous contrast. CONTRAST:  OMNIPAQUE IOHEXOL 300 MG/ML  SOLN COMPARISON:  06/14/2018 FINDINGS: Lower chest: Hypoventilatory changes are seen at the lung bases. Hepatobiliary: Stable 9 mm hypodensity right lobe liver likely a small cyst or hemangioma. The remainder of the liver  is unremarkable. The gallbladder is normal. No biliary dilation. Pancreas: Unremarkable. No pancreatic ductal dilatation or surrounding inflammatory changes. Spleen: Normal in size without focal abnormality. Adrenals/Urinary Tract: Multiple bilateral renal cortical cysts are seen, largest in the upper pole right kidney measuring 6.3 cm. No hydronephrosis or nephrolithiasis. Bladder is unremarkable. Stomach/Bowel: Dilated segment of jejunum within the mid abdomen is seen, measuring up to 4 cm in diameter. There is twisting of the central mesentery, compatible with closed loop obstruction and midgut volvulus. No evidence of bowel wall ischemia or pneumatosis. Normal appendix right lower quadrant. Postsurgical changes from distal colectomy, with left lower quadrant colostomy. Vascular/Lymphatic: Aortic atherosclerosis. No enlarged abdominal or pelvic lymph nodes. Reproductive: Uterus and bilateral adnexa are unremarkable. Other: Small volume ascites within the right upper quadrant and lower pelvis. No free intraperitoneal gas. There is a parastomal hernia within the left lower quadrant containing fat and a portion of colon. Musculoskeletal: No acute or destructive bony lesions. Reconstructed images demonstrate no additional findings. IMPRESSION: 1. Closed loop small-bowel obstruction involving the mid jejunum, with twisting of the mesenteric vessels consistent with midgut volvulus. No evidence of bowel wall ischemia at this time. Surgical consultation recommended. 2. Small volume ascites. 3. Distal colectomy, with left lower quadrant colostomy and small parastomal hernia. 4.  Aortic Atherosclerosis (ICD10-I70.0). These results were called by telephone at the time of interpretation on 08/09/2020 at 3:00 am to provider IVA KNAPP , who verbally acknowledged these results. Electronically Signed   By: Sharlet Salina M.D.   On: 08/09/2020 03:01   DG Chest  Portable 1 View  Result Date: 08/09/2020 CLINICAL DATA:   Nasogastric tube placement EXAM: PORTABLE CHEST 1 VIEW COMPARISON:  06/14/2018 FINDINGS: The enteric tube reaches the stomach with side port near the GE junction. Streaky density at the lung bases. Aortic tortuosity and stable heart size. Calcified right upper lobe nodule. No edema, effusion, or pneumothorax. IMPRESSION: Enteric tube in expected position. Electronically Signed   By: Marnee Spring M.D.   On: 08/09/2020 04:15    Anti-infectives: Anti-infectives (From admission, onward)   Start     Dose/Rate Route Frequency Ordered Stop   08/09/20 0600  cefoTEtan (CEFOTAN) 2 g in sodium chloride 0.9 % 100 mL IVPB        2 g 200 mL/hr over 30 Minutes Intravenous On call to O.R. 08/09/20 0354 08/09/20 0742      Assessment/Plan: Michelle Wall is 86 yo POD 1 s/p Ex lap, LOA for SBO from vovulous. Improving but has ileus which is not unexpected. NG output a little dark could be traumatic bleeding.  PRN for pain IS, OOB Needs to ambulate so she does not get weak NPO, NG await bowel function Agree with protonix H&H drifting, do not see any acute signs of bleeding other than dark NG, Dr. Kerry Hough repeating CBC later today, some of it is likely dilution IVF UOP good, Foley out SCDs holding prophylaxis due to drifting H&H    LOS: 1 day    Michelle Wall 08/10/2020

## 2020-08-11 ENCOUNTER — Inpatient Hospital Stay (HOSPITAL_COMMUNITY): Payer: Medicare HMO

## 2020-08-11 LAB — GLUCOSE, CAPILLARY
Glucose-Capillary: 105 mg/dL — ABNORMAL HIGH (ref 70–99)
Glucose-Capillary: 86 mg/dL (ref 70–99)
Glucose-Capillary: 89 mg/dL (ref 70–99)
Glucose-Capillary: 90 mg/dL (ref 70–99)
Glucose-Capillary: 96 mg/dL (ref 70–99)

## 2020-08-11 LAB — CBC
HCT: 28.7 % — ABNORMAL LOW (ref 36.0–46.0)
Hemoglobin: 9 g/dL — ABNORMAL LOW (ref 12.0–15.0)
MCH: 31 pg (ref 26.0–34.0)
MCHC: 31.4 g/dL (ref 30.0–36.0)
MCV: 99 fL (ref 80.0–100.0)
Platelets: 168 10*3/uL (ref 150–400)
RBC: 2.9 MIL/uL — ABNORMAL LOW (ref 3.87–5.11)
RDW: 13.8 % (ref 11.5–15.5)
WBC: 12 10*3/uL — ABNORMAL HIGH (ref 4.0–10.5)
nRBC: 0 % (ref 0.0–0.2)

## 2020-08-11 LAB — COMPREHENSIVE METABOLIC PANEL
ALT: 10 U/L (ref 0–44)
AST: 13 U/L — ABNORMAL LOW (ref 15–41)
Albumin: 2.7 g/dL — ABNORMAL LOW (ref 3.5–5.0)
Alkaline Phosphatase: 27 U/L — ABNORMAL LOW (ref 38–126)
Anion gap: 6 (ref 5–15)
BUN: 26 mg/dL — ABNORMAL HIGH (ref 8–23)
CO2: 27 mmol/L (ref 22–32)
Calcium: 8.1 mg/dL — ABNORMAL LOW (ref 8.9–10.3)
Chloride: 108 mmol/L (ref 98–111)
Creatinine, Ser: 0.56 mg/dL (ref 0.44–1.00)
GFR calc Af Amer: >60 mL/min (ref >60)
GFR calc non Af Amer: >60 mL/min (ref >60)
Glucose, Bld: 102 mg/dL — ABNORMAL HIGH (ref 70–99)
Potassium: 3.3 mmol/L — ABNORMAL LOW (ref 3.5–5.1)
Sodium: 141 mmol/L (ref 135–145)
Total Bilirubin: 0.7 mg/dL (ref 0.3–1.2)
Total Protein: 5.7 g/dL — ABNORMAL LOW (ref 6.5–8.1)

## 2020-08-11 MED ORDER — POTASSIUM CHLORIDE 10 MEQ/100ML IV SOLN
10.0000 meq | INTRAVENOUS | Status: AC
  Administered 2020-08-11 (×3): 10 meq via INTRAVENOUS
  Filled 2020-08-11 (×3): qty 100

## 2020-08-11 NOTE — Progress Notes (Signed)
2 Days Post-Op  Subjective: Patient has no incisional pain.  Objective: Vital signs in last 24 hours: Temp:  [97.5 F (36.4 C)-100 F (37.8 C)] 99.5 F (37.5 C) (09/19 0521) Pulse Rate:  [86-109] 86 (09/19 0521) Resp:  [18-19] 18 (09/19 0521) BP: (117-134)/(49-57) 134/53 (09/19 0521) SpO2:  [90 %-99 %] 95 % (09/19 0521) Weight:  [65.8 kg] 65.8 kg (09/19 0500) Last BM Date: 08/06/20  Intake/Output from previous day: 09/18 0701 - 09/19 0700 In: 0  Out: 1300 [Urine:1300] Intake/Output this shift: No intake/output data recorded.  General appearance: alert, cooperative and no distress GI: Soft, no bowel sounds appreciated.  Incision healing well.  Ostomy bag empty.  Lab Results:  Recent Labs    08/10/20 1605 08/11/20 0747  WBC 13.7* 12.0*  HGB 10.6* 9.0*  HCT 33.2* 28.7*  PLT 185 168   BMET Recent Labs    08/10/20 0604 08/11/20 0747  NA 140 141  K 3.6 3.3*  CL 103 108  CO2 29 27  GLUCOSE 138* 102*  BUN 25* 26*  CREATININE 0.76 0.56  CALCIUM 8.2* 8.1*   PT/INR No results for input(s): LABPROT, INR in the last 72 hours.  Studies/Results: No results found.  Anti-infectives: Anti-infectives (From admission, onward)   Start     Dose/Rate Route Frequency Ordered Stop   08/09/20 0600  cefoTEtan (CEFOTAN) 2 g in sodium chloride 0.9 % 100 mL IVPB        2 g 200 mL/hr over 30 Minutes Intravenous On call to O.R. 08/09/20 0354 08/09/20 0742      Assessment/Plan: s/p Procedure(s): EXPLORATORY LAPAROTOMY LYSIS OF ADHESIONS Impression: Stable on postoperative day 2.  Awaiting return of bowel function.  Mild hypokalemia will be addressed.  Hemoglobin remains relatively stable.  NG tube output is decreasing.  May have ice chips.  Discussed with Dr. Kerry Hough.  LOS: 2 days    Franky Macho 08/11/2020

## 2020-08-11 NOTE — Progress Notes (Signed)
RN notified that pt c/o chest pain.  Assessment of pt, she states it is "gas-like" pain that is in the right side of her chest and in her back on the same side.  She stated that it is a sudden pain.  Tele monitor shows SR at the time, MD notified and will be on floor soon to assess her.  Will continue to monitor.

## 2020-08-11 NOTE — Progress Notes (Signed)
PROGRESS NOTE    Michelle Wall  GYI:[id number] DOB: [birth year] DOA: 08/08/2020 PCP: Mirna Mires, MD    Brief Narrative:  Michelle Wall  is a 86 y.o. 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  Patient reports that the pain started at 4:30 PM on 08/08/2020.  She reports it was gradual in onset, but eventually became severe.  The pain is located across both upper quadrants.  It does not radiate.  Is been constant since it started.  It feels like crampy and sharp.  Patient has associated emesis 3 times that was nonbloody.  She reports that her last normal bowel movement was the morning of 08/08/2020.  She had a normal appetite and felt normal throughout the day until the pain started at 4:30 PM.  Nothing has made the pain better.  Palpation makes it worse.  Patient denies any associated fever, chest pain, palpitations, shortness of breath.  Patient is a current smoker and smokes a pack a day.  She declines nicotine patch at this time.  Of note approximately 2 years ago patient had perforated bowel, with colectomy and left lower quadrant colostomy.  Patient reports no bloody or abnormal output in her colostomy bag.  In the ED Temperature 98.7, heart rate 92, respiratory rate 14, blood pressure 163/76, saturating 100% No leukocytosis with a white blood cell count of 9.3 CHEM panel reveals hypokalemia at 2.8, hyperglycemia at 170 Gap 14 Lipase 34 Covid test pending CT abdomen pelvis shows closed-loop small bowel obstruction involving the mid jejunum with twisting of the mesenteric vessels consistent with midgut volvulus Cefotetan, fentanyl, Zofran x2, 20 mEq of potassium given in the ED Chest x-ray pending   Assessment & Plan:   Principal Problem:   Volvulus of jejunum (HCC) Active Problems:   Small  bowel obstruction (HCC)   1. Small bowel volvulus. Seen by general surgery and underwent exploratory laparotomy and lysis of adhesions with reduction of volvulus. Currently has NG tube in place. Await return of bowel function. General surgery for further postoperative care.  2. Hypokalemia. Replace. Check magnesium  3. Hypothyroidism. Resume Synthroid once able to take p.o.  4. Hyperglycemia. A1c 5.2. Possible stress response.  5. Hypertension. Currently n.p.o., use IV hydralazine as needed.  6. SVT.  Patient became tachycardic with a heart rate in the 170s.  She received IV metoprolol and Cardizem with improvement of heart rate.  We will continue on IV metoprolol until she is able to take oral verapamil again.  7.  Anemia, possibly related to GI blood loss.  Noted to have dark-colored output from NG tube.  May be related to NG tube trauma.  Started on Protonix.  Continue to follow serial hemoglobins.  Consider transfusion for hemoglobin less than 7.     DVT prophylaxis: SCDs Start: 08/09/20 0436 SCD's Start: 08/09/20 0355  Code Status: Full code Family Communication: Discussed with daughter at the bedside Disposition Plan: Status is: Inpatient  Remains inpatient appropriate because:Inpatient level of care appropriate due to severity of illness   Dispo: The patient is from: Home              Anticipated d/c is to: Home              Anticipated d/c date is: 2 days              Patient currently  is not medically stable to d/c.    Consultants:   General surgery  Procedures:   9/17 exploratory laparotomy with lysis of adhesions  Antimicrobials:      Subjective: Patient describes some discomfort in her left upper quadrant which radiated to her right shoulder.  Feels as though it may be gas.  No shortness of breath.  No abdominal pain, nausea or vomiting.  No output from ostomy.  Objective: Vitals:   08/11/20 0521 08/11/20 1346 08/11/20 1940 08/11/20 2003  BP:  (!) 134/53 (!) 146/54  (!) 134/50  Pulse: 86 90  96  Resp: 18 18    Temp: 99.5 F (37.5 C) 98.3 F (36.8 C)  99.8 F (37.7 C)  TempSrc: Oral     SpO2: 95% 100% (!) 86% 99%  Weight:      Height:        Intake/Output Summary (Last 24 hours) at 08/11/2020 2021 Last data filed at 08/11/2020 1100 Gross per 24 hour  Intake --  Output 450 ml  Net -450 ml   Filed Weights   08/08/20 2014 08/10/20 0500 08/11/20 0500  Weight: 65.8 kg 66.3 kg 65.8 kg    Examination:  General exam: Alert, awake, oriented x 3 Respiratory system: Clear to auscultation. Respiratory effort normal. Cardiovascular system: Irregular. No murmurs, rubs, gallops. Gastrointestinal system: Abdomen is nondistended, soft and nontender. No organomegaly or masses felt.  Bowel sounds are absent.  Ostomy in left lower quadrant without any output Central nervous system: Alert and oriented. No focal neurological deficits. Extremities: No C/C/E, +pedal pulses Skin: No rashes, lesions or ulcers Psychiatry: Judgement and insight appear normal. Mood & affect appropriate.   Data Reviewed: I have personally reviewed following labs and imaging studies  CBC: Recent Labs  Lab 08/08/20 2053 08/09/20 0943 08/10/20 0604 08/10/20 1605 08/11/20 0747  WBC 9.3 21.0* 14.8* 13.7* 12.0*  NEUTROABS  --  19.5* 12.3*  --   --   HGB 15.0 15.7* 11.8* 10.6* 9.0*  HCT 46.6* 49.6* 36.3 33.2* 28.7*  MCV 97.5 100.0 97.6 99.4 99.0  PLT 218 255 213 185 168   Basic Metabolic Panel: Recent Labs  Lab 08/08/20 2053 08/09/20 0943 08/10/20 0604 08/11/20 0747  NA 136 137 140 141  K 2.8* 3.7 3.6 3.3*  CL 95* 99 103 108  CO2 27 26 29 27   GLUCOSE 170* 149* 138* 102*  BUN 14 15 25* 26*  CREATININE 0.77 0.87 0.76 0.56  CALCIUM 9.7 8.5* 8.2* 8.1*  MG  --  2.1  --   --   PHOS  --   --  2.2*  --    GFR: Estimated Creatinine Clearance: 46.9 mL/min (by C-G formula based on SCr of 0.56 mg/dL). Liver Function Tests: Recent Labs  Lab  08/08/20 2053 08/11/20 0747  AST 15 13*  ALT 13 10  ALKPHOS 35* 27*  BILITOT 0.7 0.7  PROT 8.7* 5.7*  ALBUMIN 4.6 2.7*   Recent Labs  Lab 08/08/20 2053  LIPASE 34   No results for input(s): AMMONIA in the last 168 hours. Coagulation Profile: No results for input(s): INR, PROTIME in the last 168 hours. Cardiac Enzymes: No results for input(s): CKTOTAL, CKMB, CKMBINDEX, TROPONINI in the last 168 hours. BNP (last 3 results) No results for input(s): PROBNP in the last 8760 hours. HbA1C: Recent Labs    08/08/20 2053  HGBA1C 5.2   CBG: Recent Labs  Lab 08/11/20 0003 08/11/20 0342 08/11/20 0749 08/11/20 1113 08/11/20 2007  GLUCAP 105*  96 86 90 89   Lipid Profile: No results for input(s): CHOL, HDL, LDLCALC, TRIG, CHOLHDL, LDLDIRECT in the last 72 hours. Thyroid Function Tests: Recent Labs    08/08/20 2053  TSH 0.777   Anemia Panel: No results for input(s): VITAMINB12, FOLATE, FERRITIN, TIBC, IRON, RETICCTPCT in the last 72 hours. Sepsis Labs: No results for input(s): PROCALCITON, LATICACIDVEN in the last 168 hours.  Recent Results (from the past 240 hour(s))  SARS Coronavirus 2 by RT PCR (hospital order, performed in [facility] hospital lab) Nasopharyngeal Nasopharyngeal Swab     Status: None   Collection Time: 08/09/20  3:02 AM   Specimen: Nasopharyngeal Swab  Result Value Ref Range Status   SARS Coronavirus 2 NEGATIVE NEGATIVE Final    Comment: (NOTE) SARS-CoV-2 target nucleic acids are NOT DETECTED.  The SARS-CoV-2 RNA is generally detectable in upper and lower respiratory specimens during the acute phase of infection. The lowest concentration of SARS-CoV-2 viral copies this assay can detect is 250 copies / mL. A negative result does not preclude SARS-CoV-2 infection and should not be used as the sole basis for treatment or other patient management decisions.  A negative result may occur with improper specimen collection / handling, submission of  specimen other than nasopharyngeal swab, presence of viral mutation(s) within the areas targeted by this assay, and inadequate number of viral copies (<250 copies / mL). A negative result must be combined with clinical observations, patient history, and epidemiological information.  Fact Sheet for Patients:   BoilerBrush.com.cy  Fact Sheet for Healthcare Providers: https://pope.com/  This test is not yet approved or  cleared by the [place] FDA and has been authorized for detection and/or diagnosis of SARS-CoV-2 by FDA under an Emergency Use Authorization (EUA).  This EUA will remain in effect (meaning this test can be used) for the duration of the COVID-19 declaration under Section 564(b)(1) of the Act, 21 U.S.C. section 360bbb-3(b)(1), unless the authorization is terminated or revoked sooner.  Performed at [facility], [address], [place], [place] [address]          Radiology Studies: No results found.      Scheduled Meds: . Chlorhexidine Gluconate Cloth  6 each Topical Daily  . influenza vaccine adjuvanted  0.5 mL Intramuscular Tomorrow-1000  . metoprolol tartrate  5 mg Intravenous Q6H  . pantoprazole (PROTONIX) IV  40 mg Intravenous Q12H   Continuous Infusions: . sodium chloride 75 mL/hr at 08/11/20 0308  . lactated ringers       LOS: 2 days    Time spent:    Erick Blinks, MD [facility]   If 7PM-7AM, please contact night-coverage [email or web address]  08/11/2020, 8:21 PM

## 2020-08-12 ENCOUNTER — Encounter (HOSPITAL_COMMUNITY): Payer: Self-pay | Admitting: General Surgery

## 2020-08-12 LAB — BASIC METABOLIC PANEL
Anion gap: 7 (ref 5–15)
BUN: 14 mg/dL (ref 8–23)
CO2: 26 mmol/L (ref 22–32)
Calcium: 8.2 mg/dL — ABNORMAL LOW (ref 8.9–10.3)
Chloride: 106 mmol/L (ref 98–111)
Creatinine, Ser: 0.55 mg/dL (ref 0.44–1.00)
GFR calc Af Amer: >60 mL/min (ref >60)
GFR calc non Af Amer: >60 mL/min (ref >60)
Glucose, Bld: 93 mg/dL (ref 70–99)
Potassium: 3.6 mmol/L (ref 3.5–5.1)
Sodium: 139 mmol/L (ref 135–145)

## 2020-08-12 LAB — CBC
HCT: 28.2 % — ABNORMAL LOW (ref 36.0–46.0)
Hemoglobin: 8.8 g/dL — ABNORMAL LOW (ref 12.0–15.0)
MCH: 31 pg (ref 26.0–34.0)
MCHC: 31.2 g/dL (ref 30.0–36.0)
MCV: 99.3 fL (ref 80.0–100.0)
Platelets: 172 10*3/uL (ref 150–400)
RBC: 2.84 MIL/uL — ABNORMAL LOW (ref 3.87–5.11)
RDW: 13.6 % (ref 11.5–15.5)
WBC: 11.1 10*3/uL — ABNORMAL HIGH (ref 4.0–10.5)
nRBC: 0 % (ref 0.0–0.2)

## 2020-08-12 LAB — MAGNESIUM: Magnesium: 2.1 mg/dL (ref 1.7–2.4)

## 2020-08-12 LAB — PHOSPHORUS: Phosphorus: 2.2 mg/dL — ABNORMAL LOW (ref 2.5–4.6)

## 2020-08-12 LAB — GLUCOSE, CAPILLARY
Glucose-Capillary: 95 mg/dL (ref 70–99)
Glucose-Capillary: 93 mg/dL (ref 70–99)

## 2020-08-12 MED ORDER — POTASSIUM PHOSPHATES 15 MMOLE/5ML IV SOLN
15.0000 mmol | Freq: Once | INTRAVENOUS | Status: AC
  Administered 2020-08-12: 15 mmol via INTRAVENOUS
  Filled 2020-08-12: qty 5

## 2020-08-12 MED ORDER — MAGNESIUM HYDROXIDE 400 MG/5ML PO SUSP
30.0000 mL | Freq: Two times a day (BID) | ORAL | Status: DC
  Administered 2020-08-12 – 2020-08-14 (×4): 30 mL via ORAL
  Filled 2020-08-12 (×5): qty 30

## 2020-08-12 MED ORDER — INFLUENZA VAC A&B SA ADJ QUAD 0.5 ML IM PRSY
0.5000 mL | PREFILLED_SYRINGE | Freq: Once | INTRAMUSCULAR | Status: AC
  Administered 2020-08-12: 0.5 mL via INTRAMUSCULAR
  Filled 2020-08-12: qty 0.5

## 2020-08-12 NOTE — Progress Notes (Signed)
Patient called for the bathroom.  On arrival to the room this nurse noticed that her NGT was not in.  Patient helped to bathroom and back to bed.  16 french NGT placed in the right nare, chest Xray to confirm.  Will continue to monitor.

## 2020-08-12 NOTE — Progress Notes (Signed)
3 Days Post-Op  Subjective: Patient denies any abdominal pain or nausea.  Objective: Vital signs in last 24 hours: Temp:  [98.3 F (36.8 C)-99.8 F (37.7 C)] 98.8 F (37.1 C) (09/20 0527) Pulse Rate:  [81-96] 81 (09/20 0527) Resp:  [18-20] 20 (09/20 0527) BP: (134-151)/(50-62) 151/62 (09/20 0527) SpO2:  [86 %-100 %] 100 % (09/20 0527) Weight:  [66.3 kg] 66.3 kg (09/20 0500) Last BM Date: 08/06/20  Intake/Output from previous day: 09/19 0701 - 09/20 0700 In: -  Out: 1200 [Urine:450; Emesis/NG output:750] Intake/Output this shift: No intake/output data recorded.  General appearance: alert, cooperative, appears stated age and no distress GI: Soft, incision healing well.  Starting to have gas in her ostomy.  Bowel sounds present.  Lab Results:  Recent Labs    08/11/20 0747 08/12/20 0707  WBC 12.0* 11.1*  HGB 9.0* 8.8*  HCT 28.7* 28.2*  PLT 168 172   BMET Recent Labs    08/11/20 0747 08/12/20 0707  NA 141 139  K 3.3* 3.6  CL 108 106  CO2 27 26  GLUCOSE 102* 93  BUN 26* 14  CREATININE 0.56 0.55  CALCIUM 8.1* 8.2*   PT/INR No results for input(s): LABPROT, INR in the last 72 hours.  Studies/Results: DG Chest 1V REPEAT Same Day  Result Date: 08/11/2020 CLINICAL DATA:  Check gastric catheter placement EXAM: CHEST - 1 VIEW SAME DAY COMPARISON:  08/12/2019 FINDINGS: Gastric catheter now is coiled within the stomach although the tip likely remains in the distal esophagus. Repositioning is recommended. Mild right basilar infiltrate is seen and stable. IMPRESSION: Gastric catheter coiled upon itself with the tip in the distal esophagus although overall improved when compared with the prior study. Electronically Signed   By: Alcide Clever M.D.   On: 08/11/2020 23:02   DG CHEST PORT 1 VIEW  Result Date: 08/11/2020 CLINICAL DATA:  Nasogastric tube placement. EXAM: PORTABLE CHEST 1 VIEW COMPARISON:  August 09, 2020 FINDINGS: A nasogastric tube is seen. Its distal portion  enters the stomach, loops upon itself and then re-enters the distal portion of the esophagus. The distal tip is seen within the mid esophagus. Very mild atelectasis is noted within the bilateral lung bases. A stable calcified lung nodule is seen within the right upper lobe. There is no evidence of a pleural effusion or pneumothorax. The heart size and mediastinal contours are within normal limits. There is moderate severity calcification of the aortic arch. The visualized skeletal structures are unremarkable. IMPRESSION: 1. Nasogastric tube positioning, as described above, with the distal tip terminating within the mid esophagus. NG tube repositioning and subsequent plain film confirmation is recommended to decrease the risk of aspiration. 2. Very mild bibasilar atelectasis. Electronically Signed   By: Aram Candela M.D.   On: 08/11/2020 22:07    Anti-infectives: Anti-infectives (From admission, onward)   Start     Dose/Rate Route Frequency Ordered Stop   08/09/20 0600  cefoTEtan (CEFOTAN) 2 g in sodium chloride 0.9 % 100 mL IVPB        2 g 200 mL/hr over 30 Minutes Intravenous On call to O.R. 08/09/20 0354 08/09/20 0742      Assessment/Plan: s/p Procedure(s): EXPLORATORY LAPAROTOMY LYSIS OF ADHESIONS Impression: Stable on postoperative day 3.  Hemoglobin is remaining stable.  She is starting to have return of bowel function.  Mild hypophosphatemia is noted. Plan: Will remove NG tube but keep patient on ice chips for now.  We will give milk of magnesia.  LOS: 3  days    Michelle Wall 08/12/2020

## 2020-08-12 NOTE — Progress Notes (Signed)
Called by radiologist and told that the NGT needs to be adjusted and then another confirmation xray needs to be done.  Adjusted the NGT and verified by two nurses.  Follow up xray obtained.  NGT needs to be adjusted slightly.

## 2020-08-12 NOTE — Progress Notes (Signed)
PROGRESS NOTE    Michelle Wall  ZOX:[id number] DOB: 1933-01-23 DOA: 08/08/2020 PCP: Mirna MiresHill, Gerald, MD    Brief Narrative:  Michelle Wall  is a 86 y.o. 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  Patient reports that the pain started at 4:30 PM on 08/08/2020.  She reports it was gradual in onset, but eventually became severe.  The pain is located across both upper quadrants.  It does not radiate.  Is been constant since it started.  It feels like crampy and sharp.  Patient has associated emesis 3 times that was nonbloody.  She reports that her last normal bowel movement was the morning of 08/08/2020.  She had a normal appetite and felt normal throughout the day until the pain started at 4:30 PM.  Nothing has made the pain better.  Palpation makes it worse.  Patient denies any associated fever, chest pain, palpitations, shortness of breath.  Patient is a current smoker and smokes a pack a day.  She declines nicotine patch at this time.  Of note approximately 2 years ago patient had perforated bowel, with colectomy and left lower quadrant colostomy.  Patient reports no bloody or abnormal output in her colostomy bag.  In the ED Temperature 98.7, heart rate 92, respiratory rate 14, blood pressure 163/76, saturating 100% No leukocytosis with a white blood cell count of 9.3 CHEM panel reveals hypokalemia at 2.8, hyperglycemia at 170 Gap 14 Lipase 34 Covid test pending CT abdomen pelvis shows closed-loop small bowel obstruction involving the mid jejunum with twisting of the mesenteric vessels consistent with midgut volvulus Cefotetan, fentanyl, Zofran x2, 20 mEq of potassium given in the ED Chest x-ray pending   Assessment & Plan:   Principal Problem:   Volvulus of jejunum (HCC) Active Problems:   Small  bowel obstruction (HCC)   1. Small bowel volvulus. Seen by general surgery and underwent exploratory laparotomy and lysis of adhesions with reduction of volvulus.  NG tube discontinued today and started on milk of magnesia. General surgery for further postoperative care.  Encourage ambulation  2. Hypokalemia. Replaced.  Magnesium normal  3. Hypothyroidism. Resume Synthroid once able to take p.o.  4. Hyperglycemia. A1c 5.2. Possible stress response.  5. Hypertension. Currently n.p.o., use IV hydralazine as needed.  6. SVT.  Patient became tachycardic with a heart rate in the 170s.  She received IV metoprolol and Cardizem with improvement of heart rate.  We will continue on IV metoprolol until she is able to take oral verapamil again.  Follow-up EKG shows sinus rhythm with PAC/PVC  7.  Anemia, possibly related to GI blood loss.  Noted to have dark-colored output from NG tube.  May be related to NG tube trauma.  Started on Protonix.  Continue to follow serial hemoglobins.  Consider transfusion for hemoglobin less than 7.     DVT prophylaxis: SCDs Start: 08/09/20 0436 SCD's Start: 08/09/20 0355  Code Status: Full code Family Communication: Discussed with daughter at the bedside Disposition Plan: Status is: Inpatient  Remains inpatient appropriate because:Inpatient level of care appropriate due to severity of illness   Dispo: The patient is from: Home              Anticipated d/c is to: Home              Anticipated d/c date is: 2 days  Patient currently is not medically stable to d/c.    Consultants:   General surgery  Procedures:   9/17 exploratory laparotomy with lysis of adhesions  Antimicrobials:      Subjective: Has some nausea, but no vomiting.  Has not had any ostomy output.  Objective: Vitals:   08/12/20 0500 08/12/20 0527 08/12/20 1100 08/12/20 1401  BP:  (!) 151/62 (!) 142/60 (!) 146/56  Pulse:  81 78 72  Resp:  20  16  Temp:  98.8 F  (37.1 C)  98.4 F (36.9 C)  TempSrc:    Oral  SpO2:  100% 100% 100%  Weight: 66.3 kg     Height:        Intake/Output Summary (Last 24 hours) at 08/12/2020 1722 Last data filed at 08/12/2020 0530 Gross per 24 hour  Intake --  Output 750 ml  Net -750 ml   Filed Weights   08/10/20 0500 08/11/20 0500 08/12/20 0500  Weight: 66.3 kg 65.8 kg 66.3 kg    Examination:  General exam: Alert, awake, oriented x 3 Respiratory system: Clear to auscultation. Respiratory effort normal. Cardiovascular system:RRR. No murmurs, rubs, gallops. Gastrointestinal system: Abdomen is nondistended, soft and nontender. No organomegaly or masses felt. Normal bowel sounds heard.  Ostomy left lower quadrant with no stool or gas Central nervous system: Alert and oriented. No focal neurological deficits. Extremities: No C/C/E, +pedal pulses Skin: No rashes, lesions or ulcers Psychiatry: Judgement and insight appear normal. Mood & affect appropriate.    Data Reviewed: I have personally reviewed following labs and imaging studies  CBC: Recent Labs  Lab 08/09/20 0943 08/10/20 0604 08/10/20 1605 08/11/20 0747 08/12/20 0707  WBC 21.0* 14.8* 13.7* 12.0* 11.1*  NEUTROABS 19.5* 12.3*  --   --   --   HGB 15.7* 11.8* 10.6* 9.0* 8.8*  HCT 49.6* 36.3 33.2* 28.7* 28.2*  MCV 100.0 97.6 99.4 99.0 99.3  PLT 255 213 185 168 172   Basic Metabolic Panel: Recent Labs  Lab 08/08/20 2053 08/09/20 0943 08/10/20 0604 08/11/20 0747 08/12/20 0707  NA 136 137 140 141 139  K 2.8* 3.7 3.6 3.3* 3.6  CL 95* 99 103 108 106  CO2 27 26 29 27 26   GLUCOSE 170* 149* 138* 102* 93  BUN 14 15 25* 26* 14  CREATININE 0.77 0.87 0.76 0.56 0.55  CALCIUM 9.7 8.5* 8.2* 8.1* 8.2*  MG  --  2.1  --   --  2.1  PHOS  --   --  2.2*  --  2.2*   GFR: Estimated Creatinine Clearance: 47.1 mL/min (by C-G formula based on SCr of 0.55 mg/dL). Liver Function Tests: Recent Labs  Lab 08/08/20 2053 08/11/20 0747  AST 15 13*  ALT 13 10   ALKPHOS 35* 27*  BILITOT 0.7 0.7  PROT 8.7* 5.7*  ALBUMIN 4.6 2.7*   Recent Labs  Lab 08/08/20 2053  LIPASE 34   No results for input(s): AMMONIA in the last 168 hours. Coagulation Profile: No results for input(s): INR, PROTIME in the last 168 hours. Cardiac Enzymes: No results for input(s): CKTOTAL, CKMB, CKMBINDEX, TROPONINI in the last 168 hours. BNP (last 3 results) No results for input(s): PROBNP in the last 8760 hours. HbA1C: No results for input(s): HGBA1C in the last 72 hours. CBG: Recent Labs  Lab 08/11/20 0749 08/11/20 1113 08/11/20 2007 08/12/20 0058 08/12/20 0745  GLUCAP 86 90 89 95 93   Lipid Profile: No results for input(s): CHOL, HDL, LDLCALC, TRIG, CHOLHDL, LDLDIRECT  in the last 72 hours. Thyroid Function Tests: No results for input(s): TSH, T4TOTAL, FREET4, T3FREE, THYROIDAB in the last 72 hours. Anemia Panel: No results for input(s): VITAMINB12, FOLATE, FERRITIN, TIBC, IRON, RETICCTPCT in the last 72 hours. Sepsis Labs: No results for input(s): PROCALCITON, LATICACIDVEN in the last 168 hours.  Recent Results (from the past 240 hour(s))  SARS Coronavirus 2 by RT PCR (hospital order, performed in [facility] hospital lab) Nasopharyngeal Nasopharyngeal Swab     Status: None   Collection Time: 08/09/20  3:02 AM   Specimen: Nasopharyngeal Swab  Result Value Ref Range Status   SARS Coronavirus 2 NEGATIVE NEGATIVE Final    Comment: (NOTE) SARS-CoV-2 target nucleic acids are NOT DETECTED.  The SARS-CoV-2 RNA is generally detectable in upper and lower respiratory specimens during the acute phase of infection. The lowest concentration of SARS-CoV-2 viral copies this assay can detect is 250 copies / mL. A negative result does not preclude SARS-CoV-2 infection and should not be used as the sole basis for treatment or other patient management decisions.  A negative result may occur with improper specimen collection / handling, submission of specimen  other than nasopharyngeal swab, presence of viral mutation(s) within the areas targeted by this assay, and inadequate number of viral copies (<250 copies / mL). A negative result must be combined with clinical observations, patient history, and epidemiological information.  Fact Sheet for Patients:   BoilerBrush.com.cy  Fact Sheet for Healthcare Providers: https://pope.com/  This test is not yet approved or  cleared by the [place] FDA and has been authorized for detection and/or diagnosis of SARS-CoV-2 by FDA under an Emergency Use Authorization (EUA).  This EUA will remain in effect (meaning this test can be used) for the duration of the COVID-19 declaration under Section 564(b)(1) of the Act, 21 U.S.C. section 360bbb-3(b)(1), unless the authorization is terminated or revoked sooner.  Performed at [facility], [address] [place], [place] [address]          Radiology Studies: DG Chest 1V REPEAT Same Day  Result Date: 08/11/2020 CLINICAL DATA:  Check gastric catheter placement EXAM: CHEST - 1 VIEW SAME DAY COMPARISON:  08/12/2019 FINDINGS: Gastric catheter now is coiled within the stomach although the tip likely remains in the distal esophagus. Repositioning is recommended. Mild right basilar infiltrate is seen and stable. IMPRESSION: Gastric catheter coiled upon itself with the tip in the distal esophagus although overall improved when compared with the prior study. Electronically Signed   By: Alcide Clever M.D.   On: 08/11/2020 23:02   DG CHEST PORT 1 VIEW  Result Date: 08/11/2020 CLINICAL DATA:  Nasogastric tube placement. EXAM: PORTABLE CHEST 1 VIEW COMPARISON:  August 09, 2020 FINDINGS: A nasogastric tube is seen. Its distal portion enters the stomach, loops upon itself and then re-enters the distal portion of the esophagus. The distal tip is seen within the mid esophagus. Very mild atelectasis is noted within the  bilateral lung bases. A stable calcified lung nodule is seen within the right upper lobe. There is no evidence of a pleural effusion or pneumothorax. The heart size and mediastinal contours are within normal limits. There is moderate severity calcification of the aortic arch. The visualized skeletal structures are unremarkable. IMPRESSION: 1. Nasogastric tube positioning, as described above, with the distal tip terminating within the mid esophagus. NG tube repositioning and subsequent plain film confirmation is recommended to decrease the risk of aspiration. 2. Very mild bibasilar atelectasis. Electronically Signed   By: Waylan Rocher  [place] M.D.   On: 08/11/2020 22:07        Scheduled Meds: . Chlorhexidine Gluconate Cloth  6 each Topical Daily  . magnesium hydroxide  30 mL Oral q12n4p  . metoprolol tartrate  5 mg Intravenous Q6H  . pantoprazole (PROTONIX) IV  40 mg Intravenous Q12H   Continuous Infusions: . potassium PHOSPHATE IVPB (in mmol) 15 mmol (08/12/20 1718)     LOS: 3 days    Time spent:    Erick Blinks, MD [facility]   If 7PM-7AM, please contact night-coverage [email or web address]  08/12/2020, 5:22 PM

## 2020-08-12 NOTE — Progress Notes (Signed)
NGT adjusted and verified again by two nurses.  Hooked NGT back up to low intermitted suction.  Tube draining brownish drainage.  Will continue to monitor.

## 2020-08-13 LAB — CBC
HCT: 31.7 % — ABNORMAL LOW (ref 36.0–46.0)
Hemoglobin: 10.2 g/dL — ABNORMAL LOW (ref 12.0–15.0)
MCH: 31.8 pg (ref 26.0–34.0)
MCHC: 32.2 g/dL (ref 30.0–36.0)
MCV: 98.8 fL (ref 80.0–100.0)
Platelets: 211 10*3/uL (ref 150–400)
RBC: 3.21 MIL/uL — ABNORMAL LOW (ref 3.87–5.11)
RDW: 13.4 % (ref 11.5–15.5)
WBC: 8.7 10*3/uL (ref 4.0–10.5)
nRBC: 0 % (ref 0.0–0.2)

## 2020-08-13 LAB — BASIC METABOLIC PANEL
Anion gap: 10 (ref 5–15)
BUN: 14 mg/dL (ref 8–23)
CO2: 26 mmol/L (ref 22–32)
Calcium: 8.3 mg/dL — ABNORMAL LOW (ref 8.9–10.3)
Chloride: 101 mmol/L (ref 98–111)
Creatinine, Ser: 0.55 mg/dL (ref 0.44–1.00)
GFR calc Af Amer: >60 mL/min (ref >60)
GFR calc non Af Amer: >60 mL/min (ref >60)
Glucose, Bld: 90 mg/dL (ref 70–99)
Potassium: 3.2 mmol/L — ABNORMAL LOW (ref 3.5–5.1)
Sodium: 137 mmol/L (ref 135–145)

## 2020-08-13 LAB — PHOSPHORUS: Phosphorus: 2.3 mg/dL — ABNORMAL LOW (ref 2.5–4.6)

## 2020-08-13 LAB — GLUCOSE, CAPILLARY
Glucose-Capillary: 82 mg/dL (ref 70–99)
Glucose-Capillary: 98 mg/dL (ref 70–99)

## 2020-08-13 MED ORDER — POLYETHYLENE GLYCOL 3350 17 G PO PACK
17.0000 g | PACK | Freq: Every day | ORAL | Status: DC
  Administered 2020-08-13: 17 g via ORAL
  Filled 2020-08-13 (×3): qty 1

## 2020-08-13 MED ORDER — POTASSIUM CHLORIDE 10 MEQ/100ML IV SOLN
10.0000 meq | INTRAVENOUS | Status: AC
  Administered 2020-08-13 (×4): 10 meq via INTRAVENOUS
  Filled 2020-08-13 (×4): qty 100

## 2020-08-13 NOTE — Progress Notes (Signed)
PROGRESS NOTE    Ciena Concepcion LivingH Quinlivan  WFU:[id number] DOB: 02-21-1933 DOA: 08/08/2020 PCP: Mirna MiresHill, Gerald, MD    Brief Narrative:  Rose Laural BenesJohnson  is a 86 y.o. 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  Patient reports that the pain started at 4:30 PM on 08/08/2020.  She reports it was gradual in onset, but eventually became severe.  The pain is located across both upper quadrants.  It does not radiate.  Is been constant since it started.  It feels like crampy and sharp.  Patient has associated emesis 3 times that was nonbloody.  She reports that her last normal bowel movement was the morning of 08/08/2020.  She had a normal appetite and felt normal throughout the day until the pain started at 4:30 PM.  Nothing has made the pain better.  Palpation makes it worse.  Patient denies any associated fever, chest pain, palpitations, shortness of breath.  Patient is a current smoker and smokes a pack a day.  She declines nicotine patch at this time.  Of note approximately 2 years ago patient had perforated bowel, with colectomy and left lower quadrant colostomy.  Patient reports no bloody or abnormal output in her colostomy bag.  In the ED Temperature 98.7, heart rate 92, respiratory rate 14, blood pressure 163/76, saturating 100% No leukocytosis with a white blood cell count of 9.3 CHEM panel reveals hypokalemia at 2.8, hyperglycemia at 170 Gap 14 Lipase 34 Covid test pending CT abdomen pelvis shows closed-loop small bowel obstruction involving the mid jejunum with twisting of the mesenteric vessels consistent with midgut volvulus Cefotetan, fentanyl, Zofran x2, 20 mEq of potassium given in the ED Chest x-ray pending   Assessment & Plan:   Principal Problem:   Volvulus of jejunum (HCC) Active Problems:   Small  bowel obstruction (HCC)   1. Small bowel volvulus. Seen by general surgery and underwent exploratory laparotomy and lysis of adhesions with reduction of volvulus.  NG tube discontinued and started on milk of magnesia/MiraLAX. General surgery for further postoperative care.  Encourage ambulation.  Await for return of bowel function  2. Hypokalemia. Replaced.  Magnesium normal  3. Hypothyroidism. Resume Synthroid once able to take p.o.  4. Hyperglycemia. A1c 5.2. Possible stress response.  5. Hypertension. Currently n.p.o., use IV hydralazine as needed.  6. SVT.  Patient became tachycardic with a heart rate in the 170s.  She received IV metoprolol and Cardizem with improvement of heart rate.  We will continue on IV metoprolol until she is able to take oral verapamil again.  Follow-up EKG shows sinus rhythm with PAC/PVC  7.  Anemia, possibly related to GI blood loss.  Noted to have dark-colored output from NG tube.  May be related to NG tube trauma.  Started on Protonix.  Continue to follow serial hemoglobins.  Consider transfusion for hemoglobin less than 7.     DVT prophylaxis: SCDs Start: 08/09/20 0436 SCD's Start: 08/09/20 0355  Code Status: Full code Family Communication: Discussed with daughter at the bedside Disposition Plan: Status is: Inpatient  Remains inpatient appropriate because:Inpatient level of care appropriate due to severity of illness   Dispo: The patient is from: Home              Anticipated d/c is to: Home              Anticipated d/c date  is: 2 days              Patient currently is not medically stable to d/c.    Consultants:   General surgery  Procedures:   9/17 exploratory laparotomy with lysis of adhesions  Antimicrobials:      Subjective: She has not had any output from ostomy.  No nausea or vomiting.  Objective: Vitals:   08/13/20 0554 08/13/20 1156 08/13/20 1500 08/13/20 1940  BP: 120/61 (!) 117/44 138/60   Pulse: 82 79 95     Resp: 15     Temp: 98.9 F (37.2 C)     TempSrc: Oral     SpO2: 97%  91% (!) 86%  Weight: 63.8 kg     Height:        Intake/Output Summary (Last 24 hours) at 08/13/2020 2056 Last data filed at 08/13/2020 0600 Gross per 24 hour  Intake 40 ml  Output --  Net 40 ml   Filed Weights   08/11/20 0500 08/12/20 0500 08/13/20 0554  Weight: 65.8 kg 66.3 kg 63.8 kg    Examination:  General exam: Alert, awake, oriented x 3 Respiratory system: Clear to auscultation. Respiratory effort normal. Cardiovascular system:RRR. No murmurs, rubs, gallops. Gastrointestinal system: Abdomen is nondistended, soft and nontender. No organomegaly or masses felt. Normal bowel sounds heard.  Ostomy without any significant output Central nervous system: Alert and oriented. No focal neurological deficits. Extremities: No C/C/E, +pedal pulses Skin: No rashes, lesions or ulcers Psychiatry: Judgement and insight appear normal. Mood & affect appropriate.     Data Reviewed: I have personally reviewed following labs and imaging studies  CBC: Recent Labs  Lab 08/09/20 0943 08/09/20 0943 08/10/20 0604 08/10/20 1605 08/11/20 0747 08/12/20 0707 08/13/20 0511  WBC 21.0*   < > 14.8* 13.7* 12.0* 11.1* 8.7  NEUTROABS 19.5*  --  12.3*  --   --   --   --   HGB 15.7*   < > 11.8* 10.6* 9.0* 8.8* 10.2*  HCT 49.6*   < > 36.3 33.2* 28.7* 28.2* 31.7*  MCV 100.0   < > 97.6 99.4 99.0 99.3 98.8  PLT 255   < > 213 185 168 172 211   < > = values in this interval not displayed.   Basic Metabolic Panel: Recent Labs  Lab 08/09/20 0943 08/10/20 0604 08/11/20 0747 08/12/20 0707 08/13/20 0511  NA 137 140 141 139 137  K 3.7 3.6 3.3* 3.6 3.2*  CL 99 103 108 106 101  CO2 26 29 27 26 26   GLUCOSE 149* 138* 102* 93 90  BUN 15 25* 26* 14 14  CREATININE 0.87 0.76 0.56 0.55 0.55  CALCIUM 8.5* 8.2* 8.1* 8.2* 8.3*  MG 2.1  --   --  2.1  --   PHOS  --  2.2*  --  2.2* 2.3*   GFR: Estimated Creatinine Clearance: 46.3 mL/min  (by C-G formula based on SCr of 0.55 mg/dL). Liver Function Tests: Recent Labs  Lab 08/08/20 2053 08/11/20 0747  AST 15 13*  ALT 13 10  ALKPHOS 35* 27*  BILITOT 0.7 0.7  PROT 8.7* 5.7*  ALBUMIN 4.6 2.7*   Recent Labs  Lab 08/08/20 2053  LIPASE 34   No results for input(s): AMMONIA in the last 168 hours. Coagulation Profile: No results for input(s): INR, PROTIME in the last 168 hours. Cardiac Enzymes: No results for input(s): CKTOTAL, CKMB, CKMBINDEX, TROPONINI in the last 168 hours. BNP (last 3 results) No results for  input(s): PROBNP in the last 8760 hours. HbA1C: No results for input(s): HGBA1C in the last 72 hours. CBG: Recent Labs  Lab 08/11/20 1113 08/11/20 2007 08/12/20 0058 08/12/20 0745 08/13/20 0026  GLUCAP 90 89 95 93 98   Lipid Profile: No results for input(s): CHOL, HDL, LDLCALC, TRIG, CHOLHDL, LDLDIRECT in the last 72 hours. Thyroid Function Tests: No results for input(s): TSH, T4TOTAL, FREET4, T3FREE, THYROIDAB in the last 72 hours. Anemia Panel: No results for input(s): VITAMINB12, FOLATE, FERRITIN, TIBC, IRON, RETICCTPCT in the last 72 hours. Sepsis Labs: No results for input(s): PROCALCITON, LATICACIDVEN in the last 168 hours.  Recent Results (from the past 240 hour(s))  SARS Coronavirus 2 by RT PCR (hospital order, performed in [facility] hospital lab) Nasopharyngeal Nasopharyngeal Swab     Status: None   Collection Time: 08/09/20  3:02 AM   Specimen: Nasopharyngeal Swab  Result Value Ref Range Status   SARS Coronavirus 2 NEGATIVE NEGATIVE Final    Comment: (NOTE) SARS-CoV-2 target nucleic acids are NOT DETECTED.  The SARS-CoV-2 RNA is generally detectable in upper and lower respiratory specimens during the acute phase of infection. The lowest concentration of SARS-CoV-2 viral copies this assay can detect is 250 copies / mL. A negative result does not preclude SARS-CoV-2 infection and should not be used as the sole basis for treatment or  other patient management decisions.  A negative result may occur with improper specimen collection / handling, submission of specimen other than nasopharyngeal swab, presence of viral mutation(s) within the areas targeted by this assay, and inadequate number of viral copies (<250 copies / mL). A negative result must be combined with clinical observations, patient history, and epidemiological information.  Fact Sheet for Patients:   BoilerBrush.com.cy  Fact Sheet for Healthcare Providers: https://pope.com/  This test is not yet approved or  cleared by the [place] FDA and has been authorized for detection and/or diagnosis of SARS-CoV-2 by FDA under an Emergency Use Authorization (EUA).  This EUA will remain in effect (meaning this test can be used) for the duration of the COVID-19 declaration under Section 564(b)(1) of the Act, 21 U.S.C. section 360bbb-3(b)(1), unless the authorization is terminated or revoked sooner.  Performed at [facility], [address], [place], [place] [address]          Radiology Studies: DG Chest 1V REPEAT Same Day  Result Date: 08/11/2020 CLINICAL DATA:  Check gastric catheter placement EXAM: CHEST - 1 VIEW SAME DAY COMPARISON:  08/12/2019 FINDINGS: Gastric catheter now is coiled within the stomach although the tip likely remains in the distal esophagus. Repositioning is recommended. Mild right basilar infiltrate is seen and stable. IMPRESSION: Gastric catheter coiled upon itself with the tip in the distal esophagus although overall improved when compared with the prior study. Electronically Signed   By: Alcide Clever M.D.   On: 08/11/2020 23:02   DG CHEST PORT 1 VIEW  Result Date: 08/11/2020 CLINICAL DATA:  Nasogastric tube placement. EXAM: PORTABLE CHEST 1 VIEW COMPARISON:  August 09, 2020 FINDINGS: A nasogastric tube is seen. Its distal portion enters the stomach, loops upon itself and then  re-enters the distal portion of the esophagus. The distal tip is seen within the mid esophagus. Very mild atelectasis is noted within the bilateral lung bases. A stable calcified lung nodule is seen within the right upper lobe. There is no evidence of a pleural effusion or pneumothorax. The heart size and mediastinal contours are within normal limits. There is moderate severity calcification  of the aortic arch. The visualized skeletal structures are unremarkable. IMPRESSION: 1. Nasogastric tube positioning, as described above, with the distal tip terminating within the mid esophagus. NG tube repositioning and subsequent plain film confirmation is recommended to decrease the risk of aspiration. 2. Very mild bibasilar atelectasis. Electronically Signed   By: Aram Candela M.D.   On: 08/11/2020 22:07        Scheduled Meds:  Chlorhexidine Gluconate Cloth  6 each Topical Daily   magnesium hydroxide  30 mL Oral q12n4p   metoprolol tartrate  5 mg Intravenous Q6H   pantoprazole (PROTONIX) IV  40 mg Intravenous Q12H   polyethylene glycol  17 g Oral Daily   Continuous Infusions:    LOS: 4 days    Time spent:    Erick Blinks, MD [facility]   If 7PM-7AM, please contact night-coverage [email or web address]  08/13/2020, 8:56 PM

## 2020-08-13 NOTE — Progress Notes (Signed)
4 Days Post-Op  Subjective: Patient has no complaints. No emesis noted.  Objective: Vital signs in last 24 hours: Temp:  [98.4 F (36.9 C)-98.9 F (37.2 C)] 98.9 F (37.2 C) (09/21 0554) Pulse Rate:  [72-95] 82 (09/21 0554) Resp:  [15-16] 15 (09/21 0554) BP: (120-146)/(56-61) 120/61 (09/21 0554) SpO2:  [95 %-100 %] 97 % (09/21 0554) Weight:  [63.8 kg] 63.8 kg (09/21 0554) Last BM Date: 08/06/20  Intake/Output from previous day: 09/20 0701 - 09/21 0700 In: 4187.1 [P.O.:40; I.V.:4121.7; IV Piggyback:25.4] Out: -  Intake/Output this shift: No intake/output data recorded.  General appearance: alert, cooperative and no distress GI: Soft with active bowel sounds appreciated. No significant enteric contents in ostomy bag yet. Incision healing well.  Lab Results:  Recent Labs    08/12/20 0707 08/13/20 0511  WBC 11.1* 8.7  HGB 8.8* 10.2*  HCT 28.2* 31.7*  PLT 172 211   BMET Recent Labs    08/12/20 0707 08/13/20 0511  NA 139 137  K 3.6 3.2*  CL 106 101  CO2 26 26  GLUCOSE 93 90  BUN 14 14  CREATININE 0.55 0.55  CALCIUM 8.2* 8.3*   PT/INR No results for input(s): LABPROT, INR in the last 72 hours.  Studies/Results: DG Chest 1V REPEAT Same Day  Result Date: 08/11/2020 CLINICAL DATA:  Check gastric catheter placement EXAM: CHEST - 1 VIEW SAME DAY COMPARISON:  08/12/2019 FINDINGS: Gastric catheter now is coiled within the stomach although the tip likely remains in the distal esophagus. Repositioning is recommended. Mild right basilar infiltrate is seen and stable. IMPRESSION: Gastric catheter coiled upon itself with the tip in the distal esophagus although overall improved when compared with the prior study. Electronically Signed   By: Alcide Clever M.D.   On: 08/11/2020 23:02   DG CHEST PORT 1 VIEW  Result Date: 08/11/2020 CLINICAL DATA:  Nasogastric tube placement. EXAM: PORTABLE CHEST 1 VIEW COMPARISON:  August 09, 2020 FINDINGS: A nasogastric tube is seen. Its  distal portion enters the stomach, loops upon itself and then re-enters the distal portion of the esophagus. The distal tip is seen within the mid esophagus. Very mild atelectasis is noted within the bilateral lung bases. A stable calcified lung nodule is seen within the right upper lobe. There is no evidence of a pleural effusion or pneumothorax. The heart size and mediastinal contours are within normal limits. There is moderate severity calcification of the aortic arch. The visualized skeletal structures are unremarkable. IMPRESSION: 1. Nasogastric tube positioning, as described above, with the distal tip terminating within the mid esophagus. NG tube repositioning and subsequent plain film confirmation is recommended to decrease the risk of aspiration. 2. Very mild bibasilar atelectasis. Electronically Signed   By: Aram Candela M.D.   On: 08/11/2020 22:07    Anti-infectives: Anti-infectives (From admission, onward)   Start     Dose/Rate Route Frequency Ordered Stop   08/09/20 0600  cefoTEtan (CEFOTAN) 2 g in sodium chloride 0.9 % 100 mL IVPB        2 g 200 mL/hr over 30 Minutes Intravenous On call to O.R. 08/09/20 0354 08/09/20 0742      Assessment/Plan: s/p Procedure(s): EXPLORATORY LAPAROTOMY LYSIS OF ADHESIONS Impression: Postoperative day 4, status post exploratory laparotomy and lysis of adhesions. Still awaiting return of bowel function. Hypophosphatemia and hypokalemia being addressed. Patient is ambulating. Will add MiraLAX to see if we can get the patient's GI function to return.  LOS: 4 days    Franky Macho  08/13/2020 

## 2020-08-14 ENCOUNTER — Inpatient Hospital Stay (HOSPITAL_COMMUNITY): Payer: Medicare HMO

## 2020-08-14 DIAGNOSIS — I10 Essential (primary) hypertension: Secondary | ICD-10-CM

## 2020-08-14 DIAGNOSIS — K56609 Unspecified intestinal obstruction, unspecified as to partial versus complete obstruction: Secondary | ICD-10-CM

## 2020-08-14 LAB — BASIC METABOLIC PANEL
Anion gap: 16 — ABNORMAL HIGH (ref 5–15)
BUN: 23 mg/dL (ref 8–23)
CO2: 25 mmol/L (ref 22–32)
Calcium: 8.7 mg/dL — ABNORMAL LOW (ref 8.9–10.3)
Chloride: 98 mmol/L (ref 98–111)
Creatinine, Ser: 0.78 mg/dL (ref 0.44–1.00)
GFR calc Af Amer: >60 mL/min (ref >60)
GFR calc non Af Amer: >60 mL/min (ref >60)
Glucose, Bld: 94 mg/dL (ref 70–99)
Potassium: 3.6 mmol/L (ref 3.5–5.1)
Sodium: 139 mmol/L (ref 135–145)

## 2020-08-14 LAB — GLUCOSE, CAPILLARY
Glucose-Capillary: 95 mg/dL (ref 70–99)
Glucose-Capillary: 85 mg/dL (ref 70–99)
Glucose-Capillary: 92 mg/dL (ref 70–99)

## 2020-08-14 LAB — CBC
HCT: 31.3 % — ABNORMAL LOW (ref 36.0–46.0)
Hemoglobin: 10.5 g/dL — ABNORMAL LOW (ref 12.0–15.0)
MCH: 32.2 pg (ref 26.0–34.0)
MCHC: 33.5 g/dL (ref 30.0–36.0)
MCV: 96 fL (ref 80.0–100.0)
Platelets: 271 10*3/uL (ref 150–400)
RBC: 3.26 MIL/uL — ABNORMAL LOW (ref 3.87–5.11)
RDW: 13.3 % (ref 11.5–15.5)
WBC: 10.2 10*3/uL (ref 4.0–10.5)
nRBC: 0 % (ref 0.0–0.2)

## 2020-08-14 LAB — PHOSPHORUS: Phosphorus: 3 mg/dL (ref 2.5–4.6)

## 2020-08-14 MED ORDER — LEVOTHYROXINE SODIUM 100 MCG/5ML IV SOLN
50.0000 ug | Freq: Every day | INTRAVENOUS | Status: DC
  Administered 2020-08-15 – 2020-08-18 (×4): 50 ug via INTRAVENOUS
  Filled 2020-08-14 (×4): qty 5

## 2020-08-14 MED ORDER — POTASSIUM CHLORIDE 10 MEQ/100ML IV SOLN
10.0000 meq | INTRAVENOUS | Status: AC
  Administered 2020-08-14 (×2): 10 meq via INTRAVENOUS
  Filled 2020-08-14 (×2): qty 100

## 2020-08-14 MED ORDER — POTASSIUM CHLORIDE IN NACL 20-0.9 MEQ/L-% IV SOLN: INTRAVENOUS | Status: DC

## 2020-08-14 MED ORDER — SODIUM CHLORIDE 0.9 % IV SOLN: INTRAVENOUS | Status: DC

## 2020-08-14 MED ORDER — METOCLOPRAMIDE HCL 5 MG/ML IJ SOLN
10.0000 mg | Freq: Four times a day (QID) | INTRAMUSCULAR | Status: AC
  Administered 2020-08-14 – 2020-08-16 (×5): 10 mg via INTRAVENOUS
  Filled 2020-08-14 (×7): qty 2

## 2020-08-14 MED ORDER — PROMETHAZINE HCL 25 MG/ML IJ SOLN
12.5000 mg | Freq: Four times a day (QID) | INTRAMUSCULAR | Status: DC | PRN
  Administered 2020-08-14 (×2): 12.5 mg via INTRAVENOUS
  Filled 2020-08-14 (×2): qty 1

## 2020-08-14 NOTE — Evaluation (Signed)
Physical Therapy Evaluation Patient Details Name: Michelle Wall MRN: [id number] DOB: 03-12-1933 Today's Date: 08/14/2020   History of Present Illness  Michelle Wall  is a 86 y.o. female, s/p Exploratory laparotomy and lysis of adhesions, reduction of volvulus on 08/09/20 with history of seizures, nephrolithiasis, deviated septum, hypertension, hypothyroidism, and history of perforated colon status post colectomy and colostomy, presents to the ED with chief complaint of abdominal pain.  Patient reports that the pain started at 4:30 PM on 08/08/2020.  She reports it was gradual in onset, but eventually became severe.  The pain is located across both upper quadrants.  It does not radiate.  Is been constant since it started.  It feels like crampy and sharp.  Patient has associated emesis 3 times that was nonbloody.  She reports that her last normal bowel movement was the morning of 08/08/2020.  She had a normal appetite and felt normal throughout the day until the pain started at 4:30 PM.  Nothing has made the pain better.  Palpation makes it worse.  Patient denies any associated fever, chest pain, palpitations, shortness of breath.    Clinical Impression  Patient demonstrates slow labored movement for sitting up at bedside with [facility] raised, once seated for approximately 4-5 minutes, patient had seizure like near syncopal episode that resolved within 5 seconds and able to continue with therapy - RN notified.  Patient limited to a few steps at beds requiring hand held assist and tolerated sitting up in chair with her daughter present at bedside - RN aware.  Patient will benefit from continued physical therapy in hospital and recommended venue below to increase strength, balance, endurance for safe ADLs and gait.     Follow Up Recommendations Home health PT;Supervision for mobility/OOB;Supervision/Assistance - 24 hour    Equipment Recommendations  None recommended by PT    Recommendations for Other  Services       Precautions / Restrictions Precautions Precautions: Fall Restrictions Weight Bearing Restrictions: No      Mobility  Bed Mobility Overal bed mobility: Needs Assistance Bed Mobility: Supine to Sit     Supine to sit: Min assist;HOB elevated     General bed mobility comments: slow labored movement  Transfers Overall transfer level: Needs assistance Equipment used: Rolling walker (2 wheeled);1 person hand held assist Transfers: Sit to/from [employer] Sit to Stand: Min assist Stand pivot transfers: Min assist       General transfer comment: unsteady on feet requiring hand held assist to transfer to chair  Ambulation/Gait Ambulation/Gait assistance: Min assist Gait Distance (Feet): 5 Feet Assistive device: 1 person hand held assist Gait Pattern/deviations: Decreased step length - left;Decreased stance time - right;Decreased stride length Gait velocity: decreased   General Gait Details: slightly labored cadence with wide base of support, limited mostly due to fatigue and seizure like episode that lasted 3-4 seconds prior to attempting gait  Stairs            Wheelchair Mobility    Modified Rankin (Stroke Patients Only)       Balance Overall balance assessment: Needs assistance Sitting-balance support: Feet supported;No upper extremity supported Sitting balance-Leahy Scale: Good Sitting balance - Comments: seated at EOB   Standing balance support: During functional activity;Single extremity supported Standing balance-Leahy Scale: Poor Standing balance comment: fair/poor with hand held assist                             Pertinent  Vitals/Pain Pain Assessment: No/denies pain    Home Living Family/patient expects to be discharged to:: Private residence Living Arrangements: Children Available Help at Discharge: Family;Available 24 hours/day Type of Home: Mobile home Home Access: Stairs to enter Entrance  Stairs-Rails: Right;Left;Can reach both Entrance Stairs-Number of Steps: 4-5 Home Layout: One level Home Equipment: Walker - 2 wheels;Shower seat;Bedside commode      Prior Function Level of Independence: Independent with assistive device(s)         Comments: Household and short distanced community ambulator using SPC, RW PRN     Hand Dominance        Extremity/Trunk Assessment   Upper Extremity Assessment Upper Extremity Assessment: Generalized weakness    Lower Extremity Assessment Lower Extremity Assessment: Generalized weakness    Cervical / Trunk Assessment Cervical / Trunk Assessment: Normal  Communication   Communication: No difficulties  Cognition Arousal/Alertness: Awake/alert Behavior During Therapy: WFL for tasks assessed/performed Overall Cognitive Status: Within Functional Limits for tasks assessed                                        General Comments      Exercises     Assessment/Plan    PT Assessment Patient needs continued PT services  PT Problem List Decreased strength;Decreased activity tolerance;Decreased balance;Decreased mobility       PT Treatment Interventions Gait training;Stair training;Functional mobility training;Therapeutic activities;Therapeutic exercise;DME instruction;Balance training;Patient/family education    PT Goals (Current goals can be found in the Care Plan section)  Acute Rehab PT Goals Patient Stated Goal: return home with family to assist PT Goal Formulation: With patient/family Time For Goal Achievement: 08/22/20 Potential to Achieve Goals: Good    Frequency Min 4X/week   Barriers to discharge        Co-evaluation               AM-PAC PT "6 Clicks" Mobility  Outcome Measure Help needed turning from your back to your side while in a flat bed without using bedrails?: A Little Help needed moving from lying on your back to sitting on the side of a flat bed without using bedrails?: A  Little Help needed moving to and from a bed to a chair (including a wheelchair)?: A Little Help needed standing up from a chair using your arms (e.g., wheelchair or bedside chair)?: A Little Help needed to walk in hospital room?: A Lot Help needed climbing 3-5 steps with a railing? : A Lot 6 Click Score: 16    End of Session Equipment Utilized During Treatment: Oxygen Activity Tolerance: Patient tolerated treatment well;Patient limited by fatigue Patient left: in chair;with call bell/phone within reach;with family/visitor present Nurse Communication: Mobility status PT Visit Diagnosis: Unsteadiness on feet (R26.81);Other abnormalities of gait and mobility (R26.89);Muscle weakness (generalized) (M62.81)    Time: 4098-1191 PT Time Calculation (min) (ACUTE ONLY): 23 min   Charges:   PT Evaluation $PT Eval Moderate Complexity: 1 Mod PT Treatments $Therapeutic Activity: 23-37 mins        4:06 PM, 08/14/20 Ocie Bob, MPT Physical Therapist with [facility] 336 [phone] office [phone] mobile phone

## 2020-08-14 NOTE — Progress Notes (Signed)
#  16 Fr NG tube inserted right nare without difficulty. 1450cc dark bile colored output. Pt. Tolerated procedure well.

## 2020-08-14 NOTE — Progress Notes (Signed)
PROGRESS NOTE    Michelle Wall  LDJ:[id number] DOB: December 30, 1932 DOA: 08/08/2020 PCP: Mirna Mires, MD    Brief Narrative:  Michelle Wall  is a 86 y.o. 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  Patient reports that the pain started at 4:30 PM on 08/08/2020.  She reports it was gradual in onset, but eventually became severe.  The pain is located across both upper quadrants.  It does not radiate.  Is been constant since it started.  It feels like crampy and sharp.  Patient has associated emesis 3 times that was nonbloody.  She reports that her last normal bowel movement was the morning of 08/08/2020.  She had a normal appetite and felt normal throughout the day until the pain started at 4:30 PM.  Nothing has made the pain better.  Palpation makes it worse.  Patient denies any associated fever, chest pain, palpitations, shortness of breath.  Patient is a current smoker and smokes a pack a day.  She declines nicotine patch at this time.  Of note approximately 2 years ago patient had perforated bowel, with colectomy and left lower quadrant colostomy.  Patient reports no bloody or abnormal output in her colostomy bag.  In the ED Temperature 98.7, heart rate 92, respiratory rate 14, blood pressure 163/76, saturating 100% No leukocytosis with a white blood cell count of 9.3 CHEM panel reveals hypokalemia at 2.8, hyperglycemia at 170 Gap 14 Lipase 34 Covid test pending CT abdomen pelvis shows closed-loop small bowel obstruction involving the mid jejunum with twisting of the mesenteric vessels consistent with midgut volvulus Cefotetan, fentanyl, Zofran x2, 20 mEq of potassium given in the ED Chest x-ray pending   Assessment & Plan:   Principal Problem:   Volvulus of jejunum (HCC) Active Problems:   Small  bowel obstruction (HCC)   1. Small bowel volvulus. Seen by general surgery and underwent exploratory laparotomy and lysis of adhesions with reduction of volvulus.  NG tube discontinued and started on milk of magnesia/MiraLAX. General surgery for further postoperative care.  Encourage ambulation.  Await for return of bowel function prior to be able to discharge.  Patient continued to have no bowel sounds and experiencing intermittent nausea/vomiting.  Plan is for 48 hours trial using Reglan; if this fails will require replacement of NG tube.  2. Hypokalemia.  Magnesium within normal limits; continue to follow lites and replete as needed.  3. Hypothyroidism. Resume Synthroid once able to take p.o; for now will use IV Synthroid (half of home dose)  4. Hyperglycemia. A1c 5.2. Possible stress response.  Continue to follow CBGs every 4-6 hours until able to tolerate by mouth.  5. Hypertension. Currently n.p.o., continue the use of IV metoprolol and as needed hydralazine as needed.  Blood pressure currently stable.  6. SVT.  Patient became tachycardic with a heart rate in the 170s.  She received IV metoprolol and Cardizem with improvement of heart rate and has remained stable for over 24 hours now. Follow-up EKG shows sinus rhythm with PAC/PVC.  Rate has remained controlled and will discontinue telemetry at this time.  7.  Anemia, possibly related to GI blood loss.  Noted to have dark-colored output from NG tube.  May be related to NG tube trauma.  Started on Protonix.  Continue to follow serial hemoglobins.  Will transfuse for hemoglobin less than 7.  DVT prophylaxis: SCD's Start: 08/09/20 0355  Code Status: Full code Family Communication: No family at bedside. Disposition Plan: Status is: Inpatient  Remains inpatient appropriate because:Inpatient level of care appropriate due to severity of illness   Dispo: The patient is from: Home              Anticipated d/c is to: Home (hopefully;  PT will be asked to work with patient in case she needs rehabilitation prior to go home).              Anticipated d/c date is: 2 days or so.              Patient currently is not medically stable to d/c.  Still waiting for bowel function to return.-Intermittent nausea and a small vomiting episodes.    Consultants:   General surgery  Procedures:   9/17 exploratory laparotomy with lysis of adhesions  Antimicrobials:   None   Subjective: In no major distress; continue to have no output into her ostomy, also reports intermittent nausea and to small vomiting episodes overnight.  Patient is afebrile.  Denies chest pain or shortness of breath.  Objective: Vitals:   08/13/20 1940 08/13/20 2137 08/14/20 0444 08/14/20 0745  BP:  (!) 140/56 (!) 153/74 (!) 158/68  Pulse:  88 91 88  Resp:  16 16 16   Temp:  98.2 F (36.8 C) 98.8 F (37.1 C) 98.7 F (37.1 C)  TempSrc:  Oral  Oral  SpO2: (!) 86% 97% 95% 100%  Weight:      Height:       No intake or output data in the 24 hours ending 08/14/20 1322 Filed Weights   08/11/20 0500 08/12/20 0500 08/13/20 0554  Weight: 65.8 kg 66.3 kg 63.8 kg    Examination: General exam: Alert, awake, oriented x 3; reports no abdominal pain; having nausea, no air or intestinal content inside the ostomy at this time.  No fever, no chest pain, reports no shortness of breath.  Still having intermittent nausea and had 2 small vomiting episodes overnight. Respiratory system: Good air movement bilaterally; no wheezing, positive scattered rhonchi.  2 L nasal cannula in place. Cardiovascular system:RRR. No rubs, no gallops, no JVD. Gastrointestinal system: Abdomen is nondistended, soft and just mildly tender to deep palpation around incisional area.  No bowel sounds on exam. Central nervous system: Alert and oriented. No focal neurological deficits. Extremities: No cyanosis or clubbing. Skin: No rashes, no petechiae.  Incisional wound appears to be clean and  dry.  Ostomy in place without any output. Psychiatry: Judgement and insight appear normal. Mood & affect appropriate.   Data Reviewed: I have personally reviewed following labs and imaging studies  CBC: Recent Labs  Lab 08/09/20 0943 08/09/20 0943 08/10/20 0604 08/10/20 0604 08/10/20 1605 08/11/20 0747 08/12/20 0707 08/13/20 0511 08/14/20 0414  WBC 21.0*   < > 14.8*   < > 13.7* 12.0* 11.1* 8.7 10.2  NEUTROABS 19.5*  --  12.3*  --   --   --   --   --   --   HGB 15.7*   < > 11.8*   < > 10.6* 9.0* 8.8* 10.2* 10.5*  HCT 49.6*   < > 36.3   < > 33.2* 28.7* 28.2* 31.7* 31.3*  MCV 100.0   < > 97.6   < > 99.4 99.0 99.3 98.8 96.0  PLT 255   < > 213   < > 185 168 172 211 271   < > =  values in this interval not displayed.   Basic Metabolic Panel: Recent Labs  Lab 08/09/20 0943 08/09/20 0943 08/10/20 0604 08/11/20 0747 08/12/20 0707 08/13/20 0511 08/14/20 0414  NA 137   < > 140 141 139 137 139  K 3.7   < > 3.6 3.3* 3.6 3.2* 3.6  CL 99   < > 103 108 106 101 98  CO2 26   < > 29 27 26 26 25   GLUCOSE 149*   < > 138* 102* 93 90 94  BUN 15   < > 25* 26* 14 14 23   CREATININE 0.87   < > 0.76 0.56 0.55 0.55 0.78  CALCIUM 8.5*   < > 8.2* 8.1* 8.2* 8.3* 8.7*  MG 2.1  --   --   --  2.1  --   --   PHOS  --   --  2.2*  --  2.2* 2.3* 3.0   < > = values in this interval not displayed.   GFR: Estimated Creatinine Clearance: 46.3 mL/min (by C-G formula based on SCr of 0.78 mg/dL).   Liver Function Tests: Recent Labs  Lab 08/08/20 2053 08/11/20 0747  AST 15 13*  ALT 13 10  ALKPHOS 35* 27*  BILITOT 0.7 0.7  PROT 8.7* 5.7*  ALBUMIN 4.6 2.7*   Recent Labs  Lab 08/08/20 2053  LIPASE 34   CBG: Recent Labs  Lab 08/12/20 0745 08/13/20 0026 08/13/20 2137 08/14/20 0640 08/14/20 0747  GLUCAP 93 98 82 95 85    Recent Results (from the past 240 hour(s))  SARS Coronavirus 2 by RT PCR (hospital order, performed in [facility] hospital lab) Nasopharyngeal Nasopharyngeal Swab      Status: None   Collection Time: 08/09/20  3:02 AM   Specimen: Nasopharyngeal Swab  Result Value Ref Range Status   SARS Coronavirus 2 NEGATIVE NEGATIVE Final    Comment: (NOTE) SARS-CoV-2 target nucleic acids are NOT DETECTED.  The SARS-CoV-2 RNA is generally detectable in upper and lower respiratory specimens during the acute phase of infection. The lowest concentration of SARS-CoV-2 viral copies this assay can detect is 250 copies / mL. A negative result does not preclude SARS-CoV-2 infection and should not be used as the sole basis for treatment or other patient management decisions.  A negative result may occur with improper specimen collection / handling, submission of specimen other than nasopharyngeal swab, presence of viral mutation(s) within the areas targeted by this assay, and inadequate number of viral copies (<250 copies / mL). A negative result must be combined with clinical observations, patient history, and epidemiological information.  Fact Sheet for Patients:   [facility]  Fact Sheet for Healthcare Providers: 08/11/20  This test is not yet approved or  cleared by the [employer] FDA and has been authorized for detection and/or diagnosis of SARS-CoV-2 by FDA under an Emergency Use Authorization (EUA).  This EUA will remain in effect (meaning this test can be used) for the duration of the COVID-19 declaration under Section 564(b)(1) of the Act, 21 U.S.C. section 360bbb-3(b)(1), unless the authorization is terminated or revoked sooner.  Performed at [facility], [address], [place], [address] [place]      Radiology Studies: No results found.   Scheduled Meds: . Chlorhexidine Gluconate Cloth  6 each Topical Daily  . magnesium hydroxide  30 mL Oral q12n4p  . metoCLOPramide (REGLAN) injection  10 mg Intravenous Q6H  . metoprolol tartrate  5 mg Intravenous Q6H  . pantoprazole (PROTONIX) IV  40 mg Intravenous Q12H  . polyethylene glycol  17 g Oral Daily   Continuous Infusions:    LOS: 5 days    Time spent: 30 mins    Vassie Lollarlos Letesha Klecker, MD [facility]   If 7PM-7AM, please contact night-coverage [email or web address]  08/14/2020, 1:22 PM

## 2020-08-14 NOTE — Plan of Care (Signed)

## 2020-08-14 NOTE — Progress Notes (Signed)
5 Days Post-Op  Subjective: Patient is nauseated this morning, but refuses an NG tube.  Objective: Vital signs in last 24 hours: Temp:  [98.2 F (36.8 C)-98.8 F (37.1 C)] 98.7 F (37.1 C) (09/22 0745) Pulse Rate:  [88-95] 88 (09/22 0745) Resp:  [16] 16 (09/22 0745) BP: (138-158)/(56-74) 158/68 (09/22 0745) SpO2:  [86 %-100 %] 100 % (09/22 0745) Last BM Date: 08/06/20  Intake/Output from previous day: No intake/output data recorded. Intake/Output this shift: No intake/output data recorded.  General appearance: alert, cooperative and no distress GI: Soft, nondistended.  Minimal bowel sounds appreciated.  No gas or fluid in the ostomy bag.  Midline incision healing well.  Lab Results:  Recent Labs    08/13/20 0511 08/14/20 0414  WBC 8.7 10.2  HGB 10.2* 10.5*  HCT 31.7* 31.3*  PLT 211 271   BMET Recent Labs    08/13/20 0511 08/14/20 0414  NA 137 139  K 3.2* 3.6  CL 101 98  CO2 26 25  GLUCOSE 90 94  BUN 14 23  CREATININE 0.55 0.78  CALCIUM 8.3* 8.7*   PT/INR No results for input(s): LABPROT, INR in the last 72 hours.  Studies/Results: No results found.  Anti-infectives: Anti-infectives (From admission, onward)   Start     Dose/Rate Route Frequency Ordered Stop   08/09/20 0600  cefoTEtan (CEFOTAN) 2 g in sodium chloride 0.9 % 100 mL IVPB        2 g 200 mL/hr over 30 Minutes Intravenous On call to O.R. 08/09/20 0354 08/09/20 0742      Assessment/Plan: s/p Procedure(s): EXPLORATORY LAPAROTOMY LYSIS OF ADHESIONS Impression: Postoperative day 5.  Appears to have a postoperative ileus.  There is no leukocytosis and her electrolytes have been corrected.  We will try Reglan x48 hours to see if we can get the GI tract to work.  If her emesis worsens, would replace NG tube.  LOS: 5 days    Franky Macho 08/14/2020

## 2020-08-14 NOTE — Progress Notes (Addendum)
Patient vomited 150 mL of green emesis. Dr. Gwenlyn Perking aware. Will notify Dr. Lovell Sheehan    New orders placed.

## 2020-08-14 NOTE — Progress Notes (Signed)
Patient has complained of nausea during shift.  Patient vomited green emesis earlier at about 2230, it was approximately about 150 cc.  Patient has been intermittently nauseated during shift,a nd anti nausea medications have been given per order.  Patient abdomen is still soft, but has not had any output from colostomy at this time.

## 2020-08-14 NOTE — Plan of Care (Signed)
  Problem: Acute Rehab PT Goals(only PT should resolve) Goal: Pt Will Go Supine/Side To Sit Outcome: Progressing Flowsheets (Taken 08/14/2020 1608) Pt will go Supine/Side to Sit:  with modified independence  with supervision Goal: Patient Will Transfer Sit To/From Stand Outcome: Progressing Flowsheets (Taken 08/14/2020 1608) Patient will transfer sit to/from stand: with supervision Goal: Pt Will Transfer Bed To Chair/Chair To Bed Outcome: Progressing Flowsheets (Taken 08/14/2020 1608) Pt will Transfer Bed to Chair/Chair to Bed: with supervision Goal: Pt Will Ambulate Outcome: Progressing Flowsheets (Taken 08/14/2020 1608) Pt will Ambulate:  75 feet  with supervision  with min guard assist  with rolling walker   4:08 PM, 08/14/20 Ocie Bob, MPT Physical Therapist with [facility] 336 [phone] office [phone] mobile phone

## 2020-08-15 ENCOUNTER — Inpatient Hospital Stay (HOSPITAL_COMMUNITY): Payer: Medicare HMO

## 2020-08-15 ENCOUNTER — Encounter (HOSPITAL_COMMUNITY): Payer: Self-pay | Admitting: Family Medicine

## 2020-08-15 LAB — CBC
HCT: 29.2 % — ABNORMAL LOW (ref 36.0–46.0)
Hemoglobin: 9.5 g/dL — ABNORMAL LOW (ref 12.0–15.0)
MCH: 32 pg (ref 26.0–34.0)
MCHC: 32.5 g/dL (ref 30.0–36.0)
MCV: 98.3 fL (ref 80.0–100.0)
Platelets: 296 10*3/uL (ref 150–400)
RBC: 2.97 MIL/uL — ABNORMAL LOW (ref 3.87–5.11)
RDW: 13.6 % (ref 11.5–15.5)
WBC: 17.7 10*3/uL — ABNORMAL HIGH (ref 4.0–10.5)
nRBC: 0 % (ref 0.0–0.2)

## 2020-08-15 LAB — GLUCOSE, CAPILLARY
Glucose-Capillary: 82 mg/dL (ref 70–99)
Glucose-Capillary: 84 mg/dL (ref 70–99)
Glucose-Capillary: 87 mg/dL (ref 70–99)
Glucose-Capillary: 94 mg/dL (ref 70–99)

## 2020-08-15 MED ORDER — IOHEXOL 300 MG/ML  SOLN
100.0000 mL | Freq: Once | INTRAMUSCULAR | Status: AC | PRN
  Administered 2020-08-15: 100 mL via INTRAVENOUS

## 2020-08-15 MED ORDER — IOHEXOL 9 MG/ML PO SOLN
ORAL | Status: AC
  Filled 2020-08-15: qty 1000

## 2020-08-15 NOTE — Progress Notes (Signed)
Gave zofran this morning before starting CT contrast infusion trough NG tube.  With suction off, tolerated the 1000 mls given in four part over 1 1/2 hours.  When returned reconnected to suction and had 600 ml output.  Still no output through colostomy.  Denies passing gas.  Order placed for PICC to start TPN.   Family has been at bedside

## 2020-08-15 NOTE — Progress Notes (Signed)
Walked about 20 feet and had to sit down due to feeling dizzy.  Sat up in chair for about 15 minutes. Assisted to bed for PICC insertion now.

## 2020-08-15 NOTE — Progress Notes (Signed)
PROGRESS NOTE    Michelle Wall  ZOX:[id number] DOB: 30-May-1933 DOA: 08/08/2020 PCP: Mirna Mires, MD    Brief Narrative:  Michelle Wall  is a 86 y.o. 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  Patient reports that the pain started at 4:30 PM on 08/08/2020.  She reports it was gradual in onset, but eventually became severe.  The pain is located across both upper quadrants.  It does not radiate.  Is been constant since it started.  It feels like crampy and sharp.  Patient has associated emesis 3 times that was nonbloody.  She reports that her last normal bowel movement was the morning of 08/08/2020.  She had a normal appetite and felt normal throughout the day until the pain started at 4:30 PM.  Nothing has made the pain better.  Palpation makes it worse.  Patient denies any associated fever, chest pain, palpitations, shortness of breath.  Patient is a current smoker and smokes a pack a day.  She declines nicotine patch at this time.  Of note approximately 2 years ago patient had perforated bowel, with colectomy and left lower quadrant colostomy.  Patient reports no bloody or abnormal output in her colostomy bag.  In the ED Temperature 98.7, heart rate 92, respiratory rate 14, blood pressure 163/76, saturating 100% No leukocytosis with a white blood cell count of 9.3 CHEM panel reveals hypokalemia at 2.8, hyperglycemia at 170 Gap 14 Lipase 34 Covid test pending CT abdomen pelvis shows closed-loop small bowel obstruction involving the mid jejunum with twisting of the mesenteric vessels consistent with midgut volvulus Cefotetan, fentanyl, Zofran x2, 20 mEq of potassium given in the ED Chest x-ray pending   Assessment & Plan:   Principal Problem:   Volvulus of jejunum (HCC) Active Problems:   Small  bowel obstruction (HCC)   1. Small bowel volvulus. Seen by general surgery and underwent exploratory laparotomy and lysis of adhesions with reduction of volvulus.  NG tube discontinued and started on milk of magnesia/MiraLAX. General surgery for further postoperative care.  Encourage ambulation.  Await for return of bowel function prior to be able to discharge.  No bowel sounds appreciated on exam; continue intermittent nausea/vomiting related to NG tube placement on 08/14/2020; significant output appreciated.  Reports feeling slightly better.  She failed Laxative and Reglan trial.  2. Hypokalemia.  Magnesium within normal limits; continue to follow lites and replete as needed.  3. Hypothyroidism. Resume Synthroid orally once able to take p.o; for now will use IV Synthroid (half of home dose)  4. Hyperglycemia. A1c 5.2. Possible stress response.  Continue to follow CBGs every 4-6 hours until able to tolerate by mouth.  5. Hypertension. Currently n.p.o., continue the use of IV metoprolol and as needed hydralazine as needed.  Blood pressure currently stable.  6. SVT.  Patient became tachycardic with a heart rate in the 170s.  She received IV metoprolol and Cardizem with improvement of heart rate and has remained stable for over 24 hours now. Follow-up EKG shows sinus rhythm with PAC/PVC.  Rate has remained controlled and will discontinue telemetry at this time.  7.  Anemia, possibly related to GI blood loss.  Noted to have dark-colored output from NG tube.  May be related to NG tube trauma.  Started on Protonix.  Continue to follow serial hemoglobins.  Will transfuse for hemoglobin less than 7.  8.  Chronic respiratory failure with hypoxia -Chronically using 2-3 L supplementation -Stable and at baseline at this moment -Continue support and follow clinical response.    DVT prophylaxis: SCD's Start: 08/09/20 0355  Code Status: Full code Family Communication: No family at  bedside. Disposition Plan: Status is: Inpatient  Remains inpatient appropriate because:Inpatient level of care appropriate due to severity of illness   Dispo: The patient is from: Home              Anticipated d/c is to: Home (hopefully; PT will be asked to work with patient in case she needs rehabilitation prior to go home).              Anticipated d/c date is: 2 days or so.              Patient currently is not medically stable to d/c.  Still waiting for bowel function to return.-Intermittent nausea and a small vomiting episodes on 08/14/2020 led to placement of NG tube..    Consultants:   General surgery  Procedures:   9/17 exploratory laparotomy with lysis of adhesions  Antimicrobials:   None   Subjective: No chest pain, NG tube in place (significant output appreciated); no fever.  Good urine output reported.  Reports feeling better overall.  Objective: Vitals:   08/14/20 1600 08/14/20 2150 08/15/20 0500 08/15/20 0549  BP: (!) 119/52 (!) 119/57  (!) 92/52  Pulse: (!) 101 (!) 105  76  Resp: 17 16  18   Temp: 99.2 F (37.3 C) 97.9 F (36.6 C)  98.5 F (36.9 C)  TempSrc: Oral   Oral  SpO2: 95% 92%  92%  Weight:   62 kg   Height:        Intake/Output Summary (Last 24 hours) at 08/15/2020 1247 Last data filed at 08/15/2020 0900 Gross per 24 hour  Intake 796.94 ml  Output 3900 ml  Net -3103.06 ml   Filed Weights   08/12/20 0500 08/13/20 0554 08/15/20 0500  Weight: 66.3 kg 63.8 kg 62 kg    Examination: General exam: Alert, awake, oriented x 3, reports no chest pain, no fever, no abdominal pain.  At the end of the day on 08/14/2020 continue having nausea/vomiting and require replacement of NG tube. Respiratory system: Good air movement bilaterally, no wheezing, no crackles, positive scattered rhonchi. Cardiovascular system:RRR. No murmurs, rubs, gallops.  No JVD. Gastrointestinal system: Abdomen is nondistended, soft and just mildly tender to palpation around  incisional area.  Ostomy bag in place, empty, no bowel sounds appreciated. Central nervous system: Alert and oriented. No focal neurological deficits. Extremities: No cyanosis or clubbing. Skin: No rashes, lesions or ulcers Psychiatry: Judgement and insight appear normal. Mood & affect appropriate.   Data Reviewed: I have personally reviewed following labs and imaging studies  CBC: Recent Labs  Lab 08/09/20 0943 08/09/20 0943 08/10/20 0604 08/10/20 1605 08/11/20 0747 08/12/20 0707 08/13/20 0511 08/14/20 0414 08/15/20 0725  WBC 21.0*   < > 14.8*   < > 12.0* 11.1* 8.7 10.2 17.7*  NEUTROABS 19.5*  --  12.3*  --   --   --   --   --   --   HGB 15.7*   < > 11.8*   < > 9.0* 8.8* 10.2* 10.5* 9.5*  HCT 49.6*   < > 36.3   < > 28.7* 28.2* 31.7* 31.3* 29.2*  MCV 100.0   < > 97.6   < > 99.0 99.3 98.8 96.0 98.3  PLT  255   < > 213   < > 168 172 211 271 296   < > = values in this interval not displayed.   Basic Metabolic Panel: Recent Labs  Lab 08/09/20 0943 08/09/20 0943 08/10/20 0604 08/11/20 0747 08/12/20 0707 08/13/20 0511 08/14/20 0414  NA 137   < > 140 141 139 137 139  K 3.7   < > 3.6 3.3* 3.6 3.2* 3.6  CL 99   < > 103 108 106 101 98  CO2 26   < > 29 27 26 26 25   GLUCOSE 149*   < > 138* 102* 93 90 94  BUN 15   < > 25* 26* 14 14 23   CREATININE 0.87   < > 0.76 0.56 0.55 0.55 0.78  CALCIUM 8.5*   < > 8.2* 8.1* 8.2* 8.3* 8.7*  MG 2.1  --   --   --  2.1  --   --   PHOS  --   --  2.2*  --  2.2* 2.3* 3.0   < > = values in this interval not displayed.   GFR: Estimated Creatinine Clearance: 42.5 mL/min (by C-G formula based on SCr of 0.78 mg/dL).   Liver Function Tests: Recent Labs  Lab 08/08/20 2053 08/11/20 0747  AST 15 13*  ALT 13 10  ALKPHOS 35* 27*  BILITOT 0.7 0.7  PROT 8.7* 5.7*  ALBUMIN 4.6 2.7*   Recent Labs  Lab 08/08/20 2053  LIPASE 34   CBG: Recent Labs  Lab 08/14/20 0747 08/14/20 2151 08/15/20 0006 08/15/20 0733 08/15/20 1113  GLUCAP 85 92 94 87  84    Recent Results (from the past 240 hour(s))  SARS Coronavirus 2 by RT PCR (hospital order, performed in [facility] hospital lab) Nasopharyngeal Nasopharyngeal Swab     Status: None   Collection Time: 08/09/20  3:02 AM   Specimen: Nasopharyngeal Swab  Result Value Ref Range Status   SARS Coronavirus 2 NEGATIVE NEGATIVE Final    Comment: (NOTE) SARS-CoV-2 target nucleic acids are NOT DETECTED.  The SARS-CoV-2 RNA is generally detectable in upper and lower respiratory specimens during the acute phase of infection. The lowest concentration of SARS-CoV-2 viral copies this assay can detect is 250 copies / mL. A negative result does not preclude SARS-CoV-2 infection and should not be used as the sole basis for treatment or other patient management decisions.  A negative result may occur with improper specimen collection / handling, submission of specimen other than nasopharyngeal swab, presence of viral mutation(s) within the areas targeted by this assay, and inadequate number of viral copies (<250 copies / mL). A negative result must be combined with clinical observations, patient history, and epidemiological information.  Fact Sheet for Patients:   [facility]  Fact Sheet for Healthcare Providers: 08/11/20  This test is not yet approved or  cleared by the [employer] FDA and has been authorized for detection and/or diagnosis of SARS-CoV-2 by FDA under an Emergency Use Authorization (EUA).  This EUA will remain in effect (meaning this test can be used) for the duration of the COVID-19 declaration under Section 564(b)(1) of the Act, 21 U.S.C. section 360bbb-3(b)(1), unless the authorization is terminated or revoked sooner.  Performed at [facility], [address], [place], [address] [place]      Radiology Studies: CT ABDOMEN PELVIS W CONTRAST  Result Date: 08/15/2020 CLINICAL DATA:  Postoperative, bowel  obstruction suspected EXAM: CT ABDOMEN AND PELVIS WITH CONTRAST TECHNIQUE: Multidetector CT imaging of  the abdomen and pelvis was performed using the standard protocol following bolus administration of intravenous contrast. CONTRAST:  OMNIPAQUE IOHEXOL 300 MG/ML SOLN, additional oral enteric contrast COMPARISON:  08/09/2020 FINDINGS: Lower chest: Small bilateral pleural effusions associated atelectasis or consolidation, increased compared to prior examination. Three-vessel coronary artery calcifications. Hepatobiliary: No solid liver abnormality is seen. No gallstones, gallbladder wall thickening, or biliary dilatation. Pancreas: Unremarkable. No pancreatic ductal dilatation or surrounding inflammatory changes. Spleen: Normal in size without significant abnormality. Adrenals/Urinary Tract: Adrenal glands are unremarkable. Bilateral renal cysts. Kidneys are otherwise normal, without renal calculi, solid lesion, or hydronephrosis. Bladder is unremarkable. Stomach/Bowel: Esophagogastric tube is positioned with tip in the gastric fundus, side port at the level of the gastroesophageal junction. Status post Gertie Gowda procedure sigmoid colon resection with left lower quadrant end colostomy and rectal stump. 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 There is however an abrupt caliber change of the mid to distal small bowel in the central abdomen (series 2, image 50). The distal small bowel is completely decompressed. Moderate burden of stool in the colon. Vascular/Lymphatic: Aortic atherosclerosis. No enlarged abdominal or pelvic lymph nodes. Reproductive: No mass or other significant abnormality. Other: No abdominal wall hernia or abnormality. Status post midline laparotomy. Small volume ascites, similar to prior examination. Musculoskeletal: No acute or significant osseous findings. IMPRESSION: 1. Redemonstrated postoperative findings  status post Hartmann procedure sigmoid colon resection with left lower quadrant end colostomy and rectal stump. 2. 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 There is however an abrupt caliber change of the mid to distal small bowel in the central abdomen. The distal small bowel is completely decompressed. Findings are concerning for small bowel obstruction secondary to adhesion. 3. Esophagogastric tube is positioned with tip in the gastric fundus, side port at the level of the gastroesophageal junction. Recommend advancement to ensure subdiaphragmatic positioning. 4. Small volume ascites, similar to prior examination. 5. Small bilateral pleural effusions associated atelectasis or consolidation, increased compared to prior examination. 6. Coronary artery disease.  Aortic Atherosclerosis (ICD10-I70.0). These results will be called to the ordering clinician or representative by the Radiologist Assistant, and communication documented in the PACS or [employer]. Electronically Signed   By: Lauralyn Primes M.D.   On: 08/15/2020 12:17   DG Chest Port 1 View  Result Date: 08/14/2020 CLINICAL DATA:  NG tube placement EXAM: PORTABLE CHEST 1 VIEW COMPARISON:  08/11/2020 FINDINGS: The enteric tube tip projects over the gastric body. There appear to be bilateral pleural effusions, similar to prior study. There is a dense retrocardiac opacity similar to prior study which is favored to represent atelectasis. Again noted is a dense pulmonary nodule in the right upper lung zone, stable from prior study. The heart size is stable. Aortic calcifications are noted. IMPRESSION: 1. Improved placement of the NG tube. 2. Otherwise, stable appearance of the chest. Electronically Signed   By: Katherine Mantle M.D.   On: 08/14/2020 16:53     Scheduled Meds: . Chlorhexidine Gluconate Cloth  6 each Topical Daily  . iohexol      . levothyroxine  50  mcg Intravenous Daily  . magnesium hydroxide  30 mL Oral q12n4p  . metoCLOPramide (REGLAN) injection  10 mg Intravenous Q6H  . metoprolol tartrate  5 mg Intravenous Q6H  . pantoprazole (PROTONIX) IV  40 mg  Intravenous Q12H  . polyethylene glycol  17 g Oral Daily   Continuous Infusions: . 0.9 % NaCl with KCl 20 mEq / L 75 mL/hr at 08/14/20 1455     LOS: 6 days    Time spent: 30 mins    Vassie Loll, MD [facility]   If 7PM-7AM, please contact night-coverage [email or web address]  08/15/2020, 12:47 PM

## 2020-08-15 NOTE — Progress Notes (Signed)
6 Days Post-Op  Subjective: Patient feels better after NG tube placement.  Has had significant output.  No shortness of breath.  Objective: Vital signs in last 24 hours: Temp:  [97.9 F (36.6 C)-99.2 F (37.3 C)] 98.5 F (36.9 C) (09/23 0549) Pulse Rate:  [76-105] 76 (09/23 0549) Resp:  [16-18] 18 (09/23 0549) BP: (92-119)/(52-57) 92/52 (09/23 0549) SpO2:  [92 %-95 %] 92 % (09/23 0549) Weight:  [62 kg] 62 kg (09/23 0500) Last BM Date: 08/06/20  Intake/Output from previous day: 09/22 0701 - 09/23 0700 In: 796.9 [I.V.:796.9] Out: 3600 [Emesis/NG output:3600] Intake/Output this shift: No intake/output data recorded.  General appearance: alert, cooperative and no distress GI: Soft, not particularly distended.  No rigidity is noted.  Incision healing well.  No significant output from colostomy.  Lab Results:  Recent Labs    08/14/20 0414 08/15/20 0725  WBC 10.2 17.7*  HGB 10.5* 9.5*  HCT 31.3* 29.2*  PLT 271 296   BMET Recent Labs    08/13/20 0511 08/14/20 0414  NA 137 139  K 3.2* 3.6  CL 101 98  CO2 26 25  GLUCOSE 90 94  BUN 14 23  CREATININE 0.55 0.78  CALCIUM 8.3* 8.7*   PT/INR No results for input(s): LABPROT, INR in the last 72 hours.  Studies/Results: DG Chest Port 1 View  Result Date: 08/14/2020 CLINICAL DATA:  NG tube placement EXAM: PORTABLE CHEST 1 VIEW COMPARISON:  08/11/2020 FINDINGS: The enteric tube tip projects over the gastric body. There appear to be bilateral pleural effusions, similar to prior study. There is a dense retrocardiac opacity similar to prior study which is favored to represent atelectasis. Again noted is a dense pulmonary nodule in the right upper lung zone, stable from prior study. The heart size is stable. Aortic calcifications are noted. IMPRESSION: 1. Improved placement of the NG tube. 2. Otherwise, stable appearance of the chest. Electronically Signed   By: Katherine Mantle M.D.   On: 08/14/2020 16:53     Anti-infectives: Anti-infectives (From admission, onward)   Start     Dose/Rate Route Frequency Ordered Stop   08/09/20 0600  cefoTEtan (CEFOTAN) 2 g in sodium chloride 0.9 % 100 mL IVPB        2 g 200 mL/hr over 30 Minutes Intravenous On call to O.R. 08/09/20 0354 08/09/20 0742      Assessment/Plan: s/p Procedure(s): EXPLORATORY LAPAROTOMY LYSIS OF ADHESIONS Impression: Postoperative day 6.  Patient continues to have an ileus.  As she has developed leukocytosis, will get CT scan of abdomen with contrast.  Further management is pending those results.  LOS: 6 days    Franky Macho 08/15/2020

## 2020-08-15 NOTE — TOC Initial Note (Signed)
Transition of Care [facility]) - Initial/Assessment Note    Patient Details  Name: ANE CONERLY MRN: [id number] Date of Birth: 1933/10/23  Transition of Care [facility]) CM/SW Contact:    Karn Cassis, LCSW Phone Number: 08/15/2020, 11:44 AM  Clinical Narrative:  Pt admitted due to SBO. She is 6 days post op. Pt reports she lives with her son. At baseline, she is fairly independent and said she uses walker or cane when outside. Pt is on 3 liters home O2. She is unsure what company this is through. Pt describes her family as very supportive and helpful. She has 7 living children who are all local. PT evaluated pt and recommend home health. Referred to [facility] who accepts. Pt notified. Will request home health orders prior to d/c.                  Expected Discharge Plan: Home w Home Health Services Barriers to Discharge: Continued Medical Work up   Patient Goals and CMS Choice Patient states their goals for this hospitalization and ongoing recovery are:: return home   Choice offered to / list presented to : Patient  Expected Discharge Plan and Services Expected Discharge Plan: Home w Home Health Services In-house Referral: Clinical Social Work   Post Acute Care Choice: Home Health Living arrangements for the past 2 months: Single Family Home                           HH Arranged: RN, PT HH Agency: [facility] Date [facility] Agency Contacted: 08/15/20 Time HH Agency Contacted: 1143 Representative spoke with at [facility] Agency: Kandee Keen  Prior Living Arrangements/Services Living arrangements for the past 2 months: Single Family Home Lives with:: Adult Children Patient language and need for interpreter reviewed:: Yes Do you feel safe going back to the place where you live?: Yes      Need for Family Participation in Patient Care: Yes (Comment) Care giver support system in place?: Yes (comment) Current home services: DME (Cane, walker, O2) Criminal Activity/Legal Involvement  Pertinent to Current Situation/Hospitalization: No - Comment as needed  Activities of Daily Living Home Assistive Devices/Equipment: None ADL Screening (condition at time of admission) Patient's cognitive ability adequate to safely complete daily activities?: Yes Is the patient deaf or have difficulty hearing?: No Does the patient have difficulty seeing, even when wearing glasses/contacts?: No Does the patient have difficulty concentrating, remembering, or making decisions?: No Patient able to express need for assistance with ADLs?: Yes Does the patient have difficulty dressing or bathing?: No Independently performs ADLs?: Yes (appropriate for developmental age) Does the patient have difficulty walking or climbing stairs?: No Weakness of Legs: None Weakness of Arms/Hands: None  Permission Sought/Granted                  Emotional Assessment Appearance:: Appears stated age Attitude/Demeanor/Rapport: Engaged Affect (typically observed): Accepting Orientation: : Oriented to Self, Oriented to Place, Oriented to  Time, Oriented to Situation Alcohol / Substance Use: Not Applicable Psych Involvement: No (comment)  Admission diagnosis:  Small bowel obstruction (HCC) [K56.609] SBO (small bowel obstruction) (HCC) [K56.609] Volvulus of jejunum (HCC) [K56.2] Patient Active Problem List   Diagnosis Date Noted  . Small bowel obstruction (HCC) 08/09/2020  . Volvulus of jejunum (HCC)   . Perforation of sigmoid colon (HCC) 10/11/2018  . Encounter for screening colonoscopy 10/11/2018  . Colostomy in place [facility]) 10/04/2018  . Sepsis (HCC) 06/14/2018  . Hypothyroid  06/14/2018  . Perforated sigmoid colon (HCC)   . Tobacco abuse 05/16/2013  . Essential hypertension 05/16/2013  . COPD (chronic obstructive pulmonary disease) (HCC) 05/16/2013  . Pulmonary HTN (HCC) 05/16/2013  . Moderate tricuspid insufficiency 05/16/2013   PCP:  Mirna Mires, MD Pharmacy:   [facility] [phone] -  [place], [place] - [address] SEC [address] [place] [address] Phone: [phone] Fax: [phone]     Social Determinants of Health (SDOH) Interventions    Readmission Risk Interventions No flowsheet data found.

## 2020-08-16 ENCOUNTER — Inpatient Hospital Stay: Payer: Self-pay

## 2020-08-16 LAB — GLUCOSE, CAPILLARY
Glucose-Capillary: 83 mg/dL (ref 70–99)
Glucose-Capillary: 90 mg/dL (ref 70–99)
Glucose-Capillary: 99 mg/dL (ref 70–99)

## 2020-08-16 MED ORDER — FAMOTIDINE IN NACL 20-0.9 MG/50ML-% IV SOLN
20.0000 mg | INTRAVENOUS | Status: DC
  Administered 2020-08-16 – 2020-08-18 (×3): 20 mg via INTRAVENOUS
  Filled 2020-08-16 (×3): qty 50

## 2020-08-16 MED ORDER — KCL IN DEXTROSE-NACL 20-5-0.45 MEQ/L-%-% IV SOLN: INTRAVENOUS | Status: AC

## 2020-08-16 MED ORDER — TRAVASOL 10 % IV SOLN
INTRAVENOUS | Status: AC
  Filled 2020-08-16: qty 432

## 2020-08-16 NOTE — Progress Notes (Signed)
Initial Nutrition Assessment  DOCUMENTATION CODES:   Not applicable  INTERVENTION:  TPN management per pharmacist  High risk for refeeding given NPO x 7 days, recommned monitoring magnesium, potassium, and phosphorus daily, replete at needed   NUTRITION DIAGNOSIS:   Inadequate oral intake related to altered GI function (volvulus of jejunum) as evidenced by NPO status.    GOAL:   Patient will meet greater than or equal to 90% of their needs  MONITOR:   I & O's, Labs, Diet advancement, Weight trends  REASON FOR ASSESSMENT:   Consult New TPN/TNA  ASSESSMENT:  RD working remotely.  86 year old female with history of seizures,nephrolithiasis, deviated septum, HtN, hypothyroidism, perforated colon s/p colectomy and colostomy admitted with volvulus of jejunum after presenting with acute onset of worsening abdominal pain and 3 episodes of emesis.  9/17 Admit  Pt is POD#7 exploratory laparotomy and lysis of adhesions with reduction of volvulus. Ongoing ileus, intermittent nausea, with 150 ml green emesis noted on 9/22. NG tube placed with significant output. She has been NPO since admit, starting TPN today. She is at high risk for refeeding, recommend monitoring magnesium, potassium, and phosphorus daily.  Weights have been fairly stable this admission. No recent wt history for review.  I/Os: +701 ml since admit   Emesis/NG output: 1800 ml x 24 hrs UOP: 450 ml x 24 hrs Medications reviewed and include: Reglan, Protonix  IVF: D5 NaCl with KCl @ 75 ml/hr  Labs: CBGs 90,83,82 WBC 17.7 (H), Hgb 9.5 (L) HCT 29.2 (L)   NUTRITION - FOCUSED PHYSICAL EXAM: Unable to complete at this time, RD working remotely.  Diet Order:   Diet Order            Diet NPO time specified  Diet effective now                 EDUCATION NEEDS:   No education needs have been identified at this time  Skin:  Skin Assessment: Skin Integrity Issues: Skin Integrity Issues::  Incisions Incisions: closed; abdomen (9/17)  Last BM:  9/24-type 2 (colostomy)  Height:   Ht Readings from Last 1 Encounters:  08/08/20 5\' 3"  (1.6 m)    Weight:   Wt Readings from Last 1 Encounters:  08/16/20 66.9 kg    Ideal Body Weight:  52.3 kg  BMI:  Body mass index is 26.13 kg/m.  Estimated Nutritional Needs:   Kcal:  1525-1750 (MSJ x 1.4-1.6)  Protein:  87-100 (1.3-1.5 g/kg)  Fluid:  >/= 1.5 L   08/18/20, RD, LDN Clinical Nutrition After Hours/Weekend Pager # in Amion

## 2020-08-16 NOTE — Progress Notes (Signed)
Pt transferred to bedside commode. States she wanted to sit up for a while, assisted to chair. Call bell and personal items within reach.

## 2020-08-16 NOTE — Progress Notes (Addendum)
   08/16/20 1814  Vitals  BP (!) 142/74  MAP (mmHg) 91  BP Location Left Arm  BP Method Automatic  Patient Position (if appropriate) Sitting  Pulse Rate (!) 121  Pulse Rate Source Monitor  Resp 16  MEWS COLOR  MEWS Score Color Yellow  Oxygen Therapy  SpO2 96 %  O2 Device Room Air  MEWS Score  MEWS Temp 0  MEWS Systolic 0  MEWS Pulse 2  MEWS RR 0  MEWS LOC 0  MEWS Score 2   Metoprolol given scheduled

## 2020-08-16 NOTE — Progress Notes (Signed)
7 Days Post-Op  Subjective: Has no complaints.  Has had some output from ostomy.  Objective: Vital signs in last 24 hours: Temp:  [98.1 F (36.7 C)-98.5 F (36.9 C)] 98.3 F (36.8 C) (09/24 0515) Pulse Rate:  [86-100] 100 (09/24 0515) Resp:  [16-18] 18 (09/24 0515) BP: (110-139)/(44-76) 123/76 (09/24 0515) SpO2:  [90 %-97 %] 97 % (09/24 0515) Weight:  [66.9 kg] 66.9 kg (09/24 0515) Last BM Date: 08/16/20  Intake/Output from previous day: 09/23 0701 - 09/24 0700 In: 753 [I.V.:753] Out: 2250 [Urine:450; Emesis/NG output:1800] Intake/Output this shift: No intake/output data recorded.  General appearance: alert, cooperative and no distress GI: Soft, nontender.  Incision healing well.  Nondistended.  Occasional bowel sounds appreciated.  No significant gas in ostomy.  Lab Results:  Recent Labs    08/14/20 0414 08/15/20 0725  WBC 10.2 17.7*  HGB 10.5* 9.5*  HCT 31.3* 29.2*  PLT 271 296   BMET Recent Labs    08/14/20 0414  NA 139  K 3.6  CL 98  CO2 25  GLUCOSE 94  BUN 23  CREATININE 0.78  CALCIUM 8.7*   PT/INR No results for input(s): LABPROT, INR in the last 72 hours.  Studies/Results: CT ABDOMEN PELVIS W CONTRAST  Result Date: 08/15/2020 CLINICAL DATA:  Postoperative, bowel obstruction suspected EXAM: CT ABDOMEN AND PELVIS WITH CONTRAST TECHNIQUE: Multidetector CT imaging of the abdomen and pelvis was performed using the standard protocol following bolus administration of intravenous contrast. CONTRAST:  OMNIPAQUE IOHEXOL 300 MG/ML SOLN, additional oral enteric contrast COMPARISON:  08/09/2020 FINDINGS: Lower chest: Small bilateral pleural effusions associated atelectasis or consolidation, increased compared to prior examination. Three-vessel coronary artery calcifications. Hepatobiliary: No solid liver abnormality is seen. No gallstones, gallbladder wall thickening, or biliary dilatation. Pancreas: Unremarkable. No pancreatic ductal dilatation or surrounding  inflammatory changes. Spleen: Normal in size without significant abnormality. Adrenals/Urinary Tract: Adrenal glands are unremarkable. Bilateral renal cysts. Kidneys are otherwise normal, without renal calculi, solid lesion, or hydronephrosis. Bladder is unremarkable. Stomach/Bowel: Esophagogastric tube is positioned with tip in the gastric fundus, side port at the level of the gastroesophageal junction. Status post Gertie Gowda procedure sigmoid colon resection with left lower quadrant end colostomy and rectal stump. 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 There is however an abrupt caliber change of the mid to distal small bowel in the central abdomen (series 2, image 50). The distal small bowel is completely decompressed. Moderate burden of stool in the colon. Vascular/Lymphatic: Aortic atherosclerosis. No enlarged abdominal or pelvic lymph nodes. Reproductive: No mass or other significant abnormality. Other: No abdominal wall hernia or abnormality. Status post midline laparotomy. Small volume ascites, similar to prior examination. Musculoskeletal: No acute or significant osseous findings. IMPRESSION: 1. Redemonstrated postoperative findings status post Hartmann procedure sigmoid colon resection with left lower quadrant end colostomy and rectal stump. 2. 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 There is however an abrupt caliber change of the mid to distal small bowel in the central abdomen. The distal small bowel is completely decompressed. Findings are concerning for small bowel obstruction secondary to adhesion. 3. Esophagogastric tube is positioned with tip in the gastric fundus, side port at the level of the gastroesophageal junction. Recommend advancement to ensure subdiaphragmatic positioning. 4. Small volume ascites, similar to prior  examination. 5. Small bilateral pleural effusions associated  atelectasis or consolidation, increased compared to prior examination. 6. Coronary artery disease.  Aortic Atherosclerosis (ICD10-I70.0). These results will be called to the ordering clinician or representative by the Radiologist Assistant, and communication documented in the PACS or [employer]. Electronically Signed   By: Lauralyn Primes M.D.   On: 08/15/2020 12:17   DG Chest Port 1 View  Result Date: 08/14/2020 CLINICAL DATA:  NG tube placement EXAM: PORTABLE CHEST 1 VIEW COMPARISON:  08/11/2020 FINDINGS: The enteric tube tip projects over the gastric body. There appear to be bilateral pleural effusions, similar to prior study. There is a dense retrocardiac opacity similar to prior study which is favored to represent atelectasis. Again noted is a dense pulmonary nodule in the right upper lung zone, stable from prior study. The heart size is stable. Aortic calcifications are noted. IMPRESSION: 1. Improved placement of the NG tube. 2. Otherwise, stable appearance of the chest. Electronically Signed   By: Katherine Mantle M.D.   On: 08/14/2020 16:53   [place] EKG SITE RITE  Result Date: 08/16/2020 If Site Rite image not attached, placement could not be confirmed due to current cardiac rhythm.   Anti-infectives: Anti-infectives (From admission, onward)   Start     Dose/Rate Route Frequency Ordered Stop   08/09/20 0600  cefoTEtan (CEFOTAN) 2 g in sodium chloride 0.9 % 100 mL IVPB        2 g 200 mL/hr over 30 Minutes Intravenous On call to O.R. 08/09/20 0354 08/09/20 0742      Assessment/Plan: s/p Procedure(s): EXPLORATORY LAPAROTOMY LYSIS OF ADHESIONS Impression: Postoperative day 7.  CT scan yesterday revealed either an ongoing mechanical obstruction or area of narrowing.  Patient did have more output yesterday evening.  NG tube has been placed and will start TPN, hopefully she will open up.  Reexploration at this time is not  indicated.  Discussed with patient and family.  LOS: 7 days    Michelle Wall 08/16/2020

## 2020-08-16 NOTE — Care Management Important Message (Signed)
Important Message  Patient Details  Name: Michelle Wall MRN: [id number] Date of Birth: 1932/12/04   Medicare Important Message Given:  Yes     Corey Harold 08/16/2020, 3:25 PM

## 2020-08-16 NOTE — Progress Notes (Signed)
PROGRESS NOTE    Michelle Wall  GYF:[id number] DOB: January 09, 1933 DOA: 08/08/2020 PCP: Mirna Mires, MD    Brief Narrative:  Michelle Wall  is a 86 y.o. 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  Patient reports that the pain started at 4:30 PM on 08/08/2020.  She reports it was gradual in onset, but eventually became severe.  The pain is located across both upper quadrants.  It does not radiate.  Is been constant since it started.  It feels like crampy and sharp.  Patient has associated emesis 3 times that was nonbloody.  She reports that her last normal bowel movement was the morning of 08/08/2020.  She had a normal appetite and felt normal throughout the day until the pain started at 4:30 PM.  Nothing has made the pain better.  Palpation makes it worse.  Patient denies any associated fever, chest pain, palpitations, shortness of breath.  Patient is a current smoker and smokes a pack a day.  She declines nicotine patch at this time.  Of note approximately 2 years ago patient had perforated bowel, with colectomy and left lower quadrant colostomy.  Patient reports no bloody or abnormal output in her colostomy bag.  In the ED Temperature 98.7, heart rate 92, respiratory rate 14, blood pressure 163/76, saturating 100% No leukocytosis with a white blood cell count of 9.3 CHEM panel reveals hypokalemia at 2.8, hyperglycemia at 170 Gap 14 Lipase 34 Covid test pending CT abdomen pelvis shows closed-loop small bowel obstruction involving the mid jejunum with twisting of the mesenteric vessels consistent with midgut volvulus Cefotetan, fentanyl, Zofran x2, 20 mEq of potassium given in the ED Chest x-ray pending   Assessment & Plan:   Principal Problem:   Volvulus of jejunum (HCC) Active Problems:   Small  bowel obstruction (HCC)   Brief Summary:-  86 y.o. female, with history of seizures, nephrolithiasis, deviated septum, hypertension, hypothyroidism, and history of perforated colon status post colectomy and colostomy back in 2019 readmitted on 08/08/2020 with abdominal pain and concerns for SBO in the setting of volvulus and adhesions, on 08/09/20 patient underwent  Exploratory laparotomy and lysis of adhesions, reduction of small bowel volvulus , postoperatively developed ileus versus SBO, now requiring NG tube (replaced 08/14/20) and TPN (since 08/16/20)   A/p  1) postop ileus versus SBO ---------on 08/09/20 patient underwent  Exploratory laparotomy and lysis of adhesions, reduction of small bowel volvulus , postoperatively developed ileus versus SBO, now requiring NG tube (replaced 08/14/20) and TPN (since 08/16/20) -CT abdomen and pelvis from 08/15/2020 with possible SBO -Awaiting return of bowel function---starting to have scant amount of material in colostomy bag -General surgery following  2)FEN--- Hypokalemia--placed on recheck, - magnesium WNL  3) Hypothyroidism. Resume Synthroid orally once able to take p.o; for now will use IV Synthroid (half of home dose)  4. Hyperglycemia. A1c 5.2. Possible stress response.  Continue to follow CBGs every 4-6 hours until able to tolerate by mouth. -While n.p.o. patient developed borderline hypoglycemia, was started on dextrose solution pending initiation of TPN on 08/16/2020  5. Hypertension. Currently n.p.o., continue the use of IV metoprolol and as needed hydralazine as needed.  Blood pressure currently stable.  6. SVT.  Patient became tachycardic with a heart rate in the 170s.  She received IV metoprolol and Cardizem with improvement of heart rate and has remained stable  for over 24 hours now. Follow-up EKG shows sinus rhythm with PAC/PVC.  Rate has remained controlled and will discontinue telemetry at this time.  7.  Anemia, possibly related to GI  blood loss.  Noted to have dark-colored output from NG tube.  May be related to NG tube trauma.  Started on Protonix.  Continue to follow serial hemoglobins.  - Will transfuse for hemoglobin less than 7  8.  Chronic respiratory failure with hypoxia -Chronically using 2-3 L supplementation -Stable and at baseline at this moment -Continue support and follow clinical response.    DVT prophylaxis: SCD's Start: 08/09/20 0355  Code Status: Full code Family Communication: No family at bedside. Disposition Plan: Status is: Inpatient  Remains inpatient appropriate because:Inpatient level of care appropriate due to severity of illness   Dispo: The patient is from: Home              Anticipated d/c is to: Home (hopefully; PT will be asked to work with patient in case she needs rehabilitation prior to go home).              Anticipated d/c date is: 2 days or so.              Patient currently is not medically stable to d/c.  Still waiting for bowel function to return.-Intermittent nausea and a small vomiting episodes on 08/14/2020 led to placement of NG tube..    Consultants:   General surgery  Procedures:   9/17 exploratory laparotomy with lysis of adhesions  Antimicrobials:   None   Subjective: -Nausea and heartburn persist -Daughter at bedside questions answered  Objective: Vitals:   08/15/20 2146 08/16/20 0014 08/16/20 0515 08/16/20 1407  BP: (!) 129/55 139/60 123/76 138/74  Pulse: 86 89 100 (!) 129  Resp: 18  18 18   Temp: 98.1 F (36.7 C)  98.3 F (36.8 C) 98.1 F (36.7 C)  TempSrc: Oral  Oral Oral  SpO2: 96%  97% 95%  Weight:   66.9 kg   Height:        Intake/Output Summary (Last 24 hours) at 08/16/2020 1442 Last data filed at 08/16/2020 0500 Gross per 24 hour  Intake 753.02 ml  Output 1950 ml  Net -1196.98 ml   Filed Weights   08/13/20 0554 08/15/20 0500 08/16/20 0515  Weight: 63.8 kg 62 kg 66.9 kg    Examination: General exam: Alert, awake, oriented x  3, reports no chest pain, no fever, no abdominal pain.  At the end of the day on 08/14/2020 continue having nausea/vomiting and require replacement of NG tube. Respiratory system: Good air movement bilaterally, no wheezing, no crackles, positive scattered rhonchi. Cardiovascular system:RRR. No murmurs, rubs, gallops.  No JVD. Gastrointestinal system: Abdomen is nondistended, soft and just mildly tender to palpation around incisional area.  Ostomy bag in place (very scant contents),  very diminished bowel sounds appreciated. Central nervous system: Alert and oriented. No focal neurological deficits. Extremities: No cyanosis or clubbing. Skin: No rashes, lesions or ulcers Psychiatry: Judgement and insight appear normal. Mood & affect appropriate.   Data Reviewed: I have personally reviewed following labs and imaging studies  CBC: Recent Labs  Lab 08/10/20 0604 08/10/20 1605 08/11/20 0747 08/12/20 0707 08/13/20 0511 08/14/20 0414 08/15/20 0725  WBC 14.8*   < > 12.0* 11.1* 8.7 10.2 17.7*  NEUTROABS 12.3*  --   --   --   --   --   --   HGB 11.8*   < >  9.0* 8.8* 10.2* 10.5* 9.5*  HCT 36.3   < > 28.7* 28.2* 31.7* 31.3* 29.2*  MCV 97.6   < > 99.0 99.3 98.8 96.0 98.3  PLT 213   < > 168 172 211 271 296   < > = values in this interval not displayed.   Basic Metabolic Panel: Recent Labs  Lab 08/10/20 0604 08/11/20 0747 08/12/20 0707 08/13/20 0511 08/14/20 0414  NA 140 141 139 137 139  K 3.6 3.3* 3.6 3.2* 3.6  CL 103 108 106 101 98  CO2 GLUCOSE 138* 102* 93 90 94  BUN 25* 26* CREATININE 0.76 0.56 0.55 0.55 0.78  CALCIUM 8.2* 8.1* 8.2* 8.3* 8.7*  MG  --   --  2.1  --   --   PHOS 2.2*  --  2.2* 2.3* 3.0   GFR: Estimated Creatinine Clearance: 47.2 mL/min (by C-G formula based on SCr of 0.78 mg/dL).   Liver Function Tests: Recent Labs  Lab 08/11/20 0747  AST 13*  ALT 10  ALKPHOS 27*  BILITOT 0.7  PROT 5.7*  ALBUMIN 2.7*   No results for input(s):  LIPASE, AMYLASE in the last 168 hours. CBG: Recent Labs  Lab 08/15/20 1113 08/15/20 1627 08/16/20 0017 08/16/20 0753 08/16/20 1121  GLUCAP 84 82 83 90 99    Recent Results (from the past 240 hour(s))  SARS Coronavirus 2 by RT PCR (hospital order, performed in [facility] hospital lab) Nasopharyngeal Nasopharyngeal Swab     Status: None   Collection Time: 08/09/20  3:02 AM   Specimen: Nasopharyngeal Swab  Result Value Ref Range Status   SARS Coronavirus 2 NEGATIVE NEGATIVE Final    Comment: (NOTE) SARS-CoV-2 target nucleic acids are NOT DETECTED.  The SARS-CoV-2 RNA is generally detectable in upper and lower respiratory specimens during the acute phase of infection. The lowest concentration of SARS-CoV-2 viral copies this assay can detect is 250 copies / mL. A negative result does not preclude SARS-CoV-2 infection and should not be used as the sole basis for treatment or other patient management decisions.  A negative result may occur with improper specimen collection / handling, submission of specimen other than nasopharyngeal swab, presence of viral mutation(s) within the areas targeted by this assay, and inadequate number of viral copies (<250 copies / mL). A negative result must be combined with clinical observations, patient history, and epidemiological information.  Fact Sheet for Patients:   BoilerBrush.com.cy  Fact Sheet for Healthcare Providers: https://pope.com/  This test is not yet approved or  cleared by the [place] FDA and has been authorized for detection and/or diagnosis of SARS-CoV-2 by FDA under an Emergency Use Authorization (EUA).  This EUA will remain in effect (meaning this test can be used) for the duration of the COVID-19 declaration under Section 564(b)(1) of the Act, 21 U.S.C. section 360bbb-3(b)(1), unless the authorization is terminated or revoked sooner.  Performed at [facility],  [address], [place], [place] [address]      Radiology Studies: CT ABDOMEN PELVIS W CONTRAST  Result Date: 08/15/2020 CLINICAL DATA:  Postoperative, bowel obstruction suspected EXAM: CT ABDOMEN AND PELVIS WITH CONTRAST TECHNIQUE: Multidetector CT imaging of the abdomen and pelvis was performed using the standard protocol following bolus administration of intravenous contrast. CONTRAST:  OMNIPAQUE IOHEXOL 300 MG/ML SOLN, additional oral enteric contrast COMPARISON:  08/09/2020 FINDINGS: Lower chest: Small bilateral pleural effusions associated atelectasis or consolidation, increased compared to prior examination.  Three-vessel coronary artery calcifications. Hepatobiliary: No solid liver abnormality is seen. No gallstones, gallbladder wall thickening, or biliary dilatation. Pancreas: Unremarkable. No pancreatic ductal dilatation or surrounding inflammatory changes. Spleen: Normal in size without significant abnormality. Adrenals/Urinary Tract: Adrenal glands are unremarkable. Bilateral renal cysts. Kidneys are otherwise normal, without renal calculi, solid lesion, or hydronephrosis. Bladder is unremarkable. Stomach/Bowel: Esophagogastric tube is positioned with tip in the gastric fundus, side port at the level of the gastroesophageal junction. Status post Gertie Gowda procedure sigmoid colon resection with left lower quadrant end colostomy and rectal stump. 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 There is however an abrupt caliber change of the mid to distal small bowel in the central abdomen (series 2, image 50). The distal small bowel is completely decompressed. Moderate burden of stool in the colon. Vascular/Lymphatic: Aortic atherosclerosis. No enlarged abdominal or pelvic lymph nodes. Reproductive: No mass or other significant abnormality. Other: No abdominal wall hernia or abnormality. Status post midline laparotomy. Small  volume ascites, similar to prior examination. Musculoskeletal: No acute or significant osseous findings. IMPRESSION: 1. Redemonstrated postoperative findings status post Hartmann procedure sigmoid colon resection with left lower quadrant end colostomy and rectal stump. 2. 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 There is however an abrupt caliber change of the mid to distal small bowel in the central abdomen. The distal small bowel is completely decompressed. Findings are concerning for small bowel obstruction secondary to adhesion. 3. Esophagogastric tube is positioned with tip in the gastric fundus, side port at the level of the gastroesophageal junction. Recommend advancement to ensure subdiaphragmatic positioning. 4. Small volume ascites, similar to prior examination. 5. Small bilateral pleural effusions associated atelectasis or consolidation, increased compared to prior examination. 6. Coronary artery disease.  Aortic Atherosclerosis (ICD10-I70.0). These results will be called to the ordering clinician or representative by the Radiologist Assistant, and communication documented in the PACS or [employer]. Electronically Signed   By: Lauralyn Primes M.D.   On: 08/15/2020 12:17   DG Chest Port 1 View  Result Date: 08/14/2020 CLINICAL DATA:  NG tube placement EXAM: PORTABLE CHEST 1 VIEW COMPARISON:  08/11/2020 FINDINGS: The enteric tube tip projects over the gastric body. There appear to be bilateral pleural effusions, similar to prior study. There is a dense retrocardiac opacity similar to prior study which is favored to represent atelectasis. Again noted is a dense pulmonary nodule in the right upper lung zone, stable from prior study. The heart size is stable. Aortic calcifications are noted. IMPRESSION: 1. Improved placement of the NG tube. 2. Otherwise, stable appearance of the chest. Electronically Signed   By:  Katherine Mantle M.D.   On: 08/14/2020 16:53   [place] EKG SITE RITE  Result Date: 08/16/2020 If Site Rite image not attached, placement could not be confirmed due to current cardiac rhythm.    Scheduled Meds: . Chlorhexidine Gluconate Cloth  6 each Topical Daily  . levothyroxine  50 mcg Intravenous Daily  . metoprolol tartrate  5 mg Intravenous Q6H  . pantoprazole (PROTONIX) IV  40 mg Intravenous Q12H   Continuous Infusions: . dextrose 5 % and 0.45 % NaCl with KCl 20 mEq/L 75 mL/hr at 08/16/20 0945  . famotidine (PEPCID) IV    . TPN ADULT (ION)       LOS: 7 days  Shon Hale, MD [facility]   If 7PM-7AM, please contact night-coverage [email or web address]  08/16/2020, 2:42 PM

## 2020-08-16 NOTE — Progress Notes (Signed)
   08/16/20 1407  Vitals  Temp 98.1 F (36.7 C)  Temp Source Oral  BP 138/74  MAP (mmHg) 87  BP Location Left Arm  BP Method Automatic  Patient Position (if appropriate) Lying  Pulse Rate (!) 129  Pulse Rate Source Monitor  Resp 18  MEWS COLOR  MEWS Score Color Yellow  Oxygen Therapy  SpO2 95 %  O2 Device Room Air  MEWS Score  MEWS Temp 0  MEWS Systolic 0  MEWS Pulse 2  MEWS RR 0  MEWS LOC 0  MEWS Score 2

## 2020-08-16 NOTE — Progress Notes (Signed)
Physical Therapy Treatment Patient Details Name: Michelle Wall MRN: [id number] DOB: 27-Oct-1933 Today's Date: 08/16/2020    History of Present Illness Michelle Wall  is a 86 y.o. female, s/p Exploratory laparotomy and lysis of adhesions, reduction of volvulus on 08/09/20 with history of seizures, nephrolithiasis, deviated septum, hypertension, hypothyroidism, and history of perforated colon status post colectomy and colostomy, presents to the ED with chief complaint of abdominal pain.  Patient reports that the pain started at 4:30 PM on 08/08/2020.  She reports it was gradual in onset, but eventually became severe.  The pain is located across both upper quadrants.  It does not radiate.  Is been constant since it started.  It feels like crampy and sharp.  Patient has associated emesis 3 times that was nonbloody.  She reports that her last normal bowel movement was the morning of 08/08/2020.  She had a normal appetite and felt normal throughout the day until the pain started at 4:30 PM.  Nothing has made the pain better.  Palpation makes it worse.  Patient denies any associated fever, chest pain, palpitations, shortness of breath.    PT Comments    Patient demonstrates increased endurance/distance for ambulation without loss of balance while on 2 LPM O2 with SpO2 at 87%, once seated SpO2 increased to 96%.  Patient demonstrates good return for completing BLE ROM/strengthening exercises with verbal cueing and demonstration while seated in chair and tolerated staying up in chair with her daughter present after therapy.  Patient will benefit from continued physical therapy in hospital and recommended venue below to increase strength, balance, endurance for safe ADLs and gait.    Follow Up Recommendations  Home health PT;Supervision for mobility/OOB;Supervision/Assistance - 24 hour     Equipment Recommendations  None recommended by PT    Recommendations for Other Services       Precautions /  Restrictions Precautions Precautions: Fall Restrictions Weight Bearing Restrictions: No    Mobility  Bed Mobility               General bed mobility comments: Patient presents up ambulating with RN  Transfers   Equipment used: Rolling walker (2 wheeled) Transfers: Sit to/from [employer] Sit to Stand: Min guard Stand pivot transfers: Min guard       General transfer comment: slightly unsteady, increased time  Ambulation/Gait Ambulation/Gait assistance: Min guard Gait Distance (Feet): 75 Feet Assistive device: Rolling walker (2 wheeled) Gait Pattern/deviations: Decreased step length - left;Decreased stance time - right;Decreased stride length Gait velocity: decreased   General Gait Details: slightly labored cadence without loss of balance, limited secondary to fatigue   Stairs             Wheelchair Mobility    Modified Rankin (Stroke Patients Only)       Balance Overall balance assessment: Needs assistance Sitting-balance support: Feet supported;No upper extremity supported Sitting balance-Leahy Scale: Good Sitting balance - Comments: seated in chair   Standing balance support: During functional activity;Bilateral upper extremity supported Standing balance-Leahy Scale: Fair Standing balance comment: using RW                            Cognition Arousal/Alertness: Awake/alert Behavior During Therapy: WFL for tasks assessed/performed Overall Cognitive Status: Within Functional Limits for tasks assessed  Exercises General Exercises - Lower Extremity Long Arc Quad: Seated;AROM;Strengthening;Both;10 reps Hip Flexion/Marching: Seated;AROM;Strengthening;Both;10 reps Toe Raises: Seated;AROM;Strengthening;Both;10 reps Heel Raises: Seated;AROM;Strengthening;Both;10 reps    General Comments        Pertinent Vitals/Pain Pain Assessment: No/denies pain    Home  Living                      Prior Function            PT Goals (current goals can now be found in the care plan section) Acute Rehab PT Goals Patient Stated Goal: return home with family to assist PT Goal Formulation: With patient/family Time For Goal Achievement: 08/22/20 Potential to Achieve Goals: Good Progress towards PT goals: Progressing toward goals    Frequency    Min 4X/week      PT Plan Current plan remains appropriate    Co-evaluation              AM-PAC PT "6 Clicks" Mobility   Outcome Measure  Help needed turning from your back to your side while in a flat bed without using bedrails?: A Little Help needed moving from lying on your back to sitting on the side of a flat bed without using bedrails?: A Little Help needed moving to and from a bed to a chair (including a wheelchair)?: A Little Help needed standing up from a chair using your arms (e.g., wheelchair or bedside chair)?: A Little Help needed to walk in hospital room?: A Little Help needed climbing 3-5 steps with a railing? : A Lot 6 Click Score: 17    End of Session Equipment Utilized During Treatment: Oxygen Activity Tolerance: Patient tolerated treatment well;Patient limited by fatigue Patient left: in chair;with call bell/phone within reach;with family/visitor present Nurse Communication: Mobility status PT Visit Diagnosis: Unsteadiness on feet (R26.81);Other abnormalities of gait and mobility (R26.89);Muscle weakness (generalized) (M62.81)     Time: 1017-5102 PT Time Calculation (min) (ACUTE ONLY): 20 min  Charges:  $Gait Training: 8-22 mins $Therapeutic Exercise: 8-22 mins                     1:51 PM, 08/16/20 Ocie Bob, MPT Physical Therapist with [facility] 336 [phone] office [phone] mobile phone

## 2020-08-16 NOTE — Progress Notes (Signed)
PHARMACY - TOTAL PARENTERAL NUTRITION CONSULT NOTE   Indication: Prolonged ileus  Patient Measurements: Height: 5\' 3"  (160 cm) Weight: 66.9 kg (147 lb 7.8 oz) IBW/kg (Calculated) : 52.4 TPN AdjBW (KG): 55.7 Body mass index is 26.13 kg/m. Usual Weight: 67 kg  Assessment:  Per RD note: Pt is POD#7 exploratory laparotomy and lysis of adhesions with reduction of volvulus. Ongoing ileus, intermittent nausea, with 150 ml green emesis noted on 9/22. NG tube placed with significant output. She has been NPO since admit, starting TPN today. She is at high risk for refeeding, recommend monitoring magnesium, potassium, and phosphorus daily  Glucose / Insulin:  RBGs 82-99-->   Initiating SSI (sensitive scale) q4h Electrolytes:  WNL Renal:  CrClest~47 mL/min   LFTs / TGs: pending Prealbumin / albumin: pending   Intake/Output from previous day: 09/22 0701 - 09/23 0700 In: 796.9 [I.V.:796.9] Out: 3600 [Emesis/NG output:3600] Intake/Output this shift: No intake/output data recorded.   MIVF:    D5& 1/2NS w/ KCL 108meq/L at 80ml/hr--> to be discontinued once TPN starts this evening GI Imaging:  9/23 CT abd/pelvis w/contrast Surgeries / Procedures:  Pt is POD#7 exploratory laparotomy and lysis of adhesions with reduction of volvulus.  Central access:  Double lumen PICC placed 08/15/20 TPN start date: 08/16/20 at 1800  Nutritional Goals (per RD recommendation on 9/24) kCal: 1525-1750   Protein: 87-100g   Fluid:  >/= 1.5L Goal TPN rate is 65 mL/hr (provides ~87 g of protein and ~1600 kcals per day)  Current Nutrition:  NG tube placed with significant output   Plan:  Start TPN at 24mL/hr at 1800 Electrolytes in TPN: 95mEq/L of Na, 70mEq/L of K, 58mEq/L of Ca, 79mEq/L of Mg, and 38mmol/L of Phos.  Cl:Ac ratio 1:1 Add standard MVI and trace elements to TPN Initiate Sensitive q4h SSI and adjust as needed  Discontinue MIVF   at 1800 Monitor TPN labs on Mon/Thurs   12m 08/16/2020,11:45  AM

## 2020-08-17 ENCOUNTER — Inpatient Hospital Stay (HOSPITAL_COMMUNITY): Payer: Medicare HMO

## 2020-08-17 LAB — DIFFERENTIAL
Abs Immature Granulocytes: 0.02 10*3/uL (ref 0.00–0.07)
Basophils Absolute: 0 10*3/uL (ref 0.0–0.1)
Basophils Relative: 0 %
Eosinophils Absolute: 0 10*3/uL (ref 0.0–0.5)
Eosinophils Relative: 0 %
Lymphocytes Relative: 12 %
Lymphs Abs: 1.8 10*3/uL (ref 0.7–4.0)
Monocytes Absolute: 1.3 10*3/uL — ABNORMAL HIGH (ref 0.1–1.0)
Monocytes Relative: 9 %
Neutro Abs: 11.6 10*3/uL — ABNORMAL HIGH (ref 1.7–7.7)
Neutrophils Relative %: 79 %

## 2020-08-17 LAB — PREALBUMIN: Prealbumin: 5.2 mg/dL — ABNORMAL LOW (ref 18–38)

## 2020-08-17 LAB — CBC
HCT: 27.4 % — ABNORMAL LOW (ref 36.0–46.0)
Hemoglobin: 8.8 g/dL — ABNORMAL LOW (ref 12.0–15.0)
MCH: 31.2 pg (ref 26.0–34.0)
MCHC: 32.1 g/dL (ref 30.0–36.0)
MCV: 97.2 fL (ref 80.0–100.0)
Platelets: 292 10*3/uL (ref 150–400)
RBC: 2.82 MIL/uL — ABNORMAL LOW (ref 3.87–5.11)
RDW: 13.9 % (ref 11.5–15.5)
WBC: 14.7 10*3/uL — ABNORMAL HIGH (ref 4.0–10.5)
nRBC: 0.1 % (ref 0.0–0.2)

## 2020-08-17 LAB — COMPREHENSIVE METABOLIC PANEL
ALT: 11 U/L (ref 0–44)
AST: 12 U/L — ABNORMAL LOW (ref 15–41)
Albumin: 2.5 g/dL — ABNORMAL LOW (ref 3.5–5.0)
Alkaline Phosphatase: 32 U/L — ABNORMAL LOW (ref 38–126)
Anion gap: 10 (ref 5–15)
BUN: 18 mg/dL (ref 8–23)
CO2: 28 mmol/L (ref 22–32)
Calcium: 8.6 mg/dL — ABNORMAL LOW (ref 8.9–10.3)
Chloride: 107 mmol/L (ref 98–111)
Creatinine, Ser: 0.69 mg/dL (ref 0.44–1.00)
GFR calc Af Amer: >60 mL/min (ref >60)
GFR calc non Af Amer: >60 mL/min (ref >60)
Glucose, Bld: 171 mg/dL — ABNORMAL HIGH (ref 70–99)
Potassium: 3.2 mmol/L — ABNORMAL LOW (ref 3.5–5.1)
Sodium: 145 mmol/L (ref 135–145)
Total Bilirubin: 0.5 mg/dL (ref 0.3–1.2)
Total Protein: 6 g/dL — ABNORMAL LOW (ref 6.5–8.1)

## 2020-08-17 LAB — GLUCOSE, CAPILLARY
Glucose-Capillary: 130 mg/dL — ABNORMAL HIGH (ref 70–99)
Glucose-Capillary: 133 mg/dL — ABNORMAL HIGH (ref 70–99)
Glucose-Capillary: 151 mg/dL — ABNORMAL HIGH (ref 70–99)
Glucose-Capillary: 155 mg/dL — ABNORMAL HIGH (ref 70–99)
Glucose-Capillary: 159 mg/dL — ABNORMAL HIGH (ref 70–99)
Glucose-Capillary: 159 mg/dL — ABNORMAL HIGH (ref 70–99)

## 2020-08-17 LAB — TRIGLYCERIDES: Triglycerides: 104 mg/dL (ref <150)

## 2020-08-17 LAB — MAGNESIUM: Magnesium: 2.4 mg/dL (ref 1.7–2.4)

## 2020-08-17 LAB — PHOSPHORUS: Phosphorus: 1 mg/dL — CL (ref 2.5–4.6)

## 2020-08-17 MED ORDER — TRAVASOL 10 % IV SOLN
INTRAVENOUS | Status: AC
  Filled 2020-08-17: qty 702

## 2020-08-17 MED ORDER — PANTOPRAZOLE SODIUM 40 MG PO TBEC
40.0000 mg | DELAYED_RELEASE_TABLET | Freq: Every day | ORAL | Status: DC
  Administered 2020-08-18 – 2020-08-19 (×2): 40 mg via ORAL
  Filled 2020-08-17 (×2): qty 1

## 2020-08-17 MED ORDER — LORAZEPAM 2 MG/ML IJ SOLN
1.0000 mg | Freq: Once | INTRAMUSCULAR | Status: AC
  Administered 2020-08-17: 1 mg via INTRAVENOUS
  Filled 2020-08-17: qty 1

## 2020-08-17 MED ORDER — K PHOS MONO-SOD PHOS DI & MONO 155-852-130 MG PO TABS
250.0000 mg | ORAL_TABLET | Freq: Three times a day (TID) | ORAL | Status: DC
  Administered 2020-08-17 – 2020-08-19 (×6): 250 mg via ORAL
  Filled 2020-08-17 (×6): qty 1

## 2020-08-17 MED ORDER — POTASSIUM PHOSPHATES 15 MMOLE/5ML IV SOLN
20.0000 mmol | Freq: Once | INTRAVENOUS | Status: DC
  Filled 2020-08-17: qty 6.67

## 2020-08-17 MED ORDER — POTASSIUM PHOSPHATES 15 MMOLE/5ML IV SOLN: 20.0000 mmol | Freq: Once | INTRAVENOUS | Status: DC

## 2020-08-17 MED ORDER — POTASSIUM CHLORIDE 20 MEQ PO PACK
40.0000 meq | PACK | Freq: Two times a day (BID) | ORAL | Status: DC
  Administered 2020-08-17 – 2020-08-19 (×5): 40 meq via ORAL
  Filled 2020-08-17 (×5): qty 2

## 2020-08-17 MED ORDER — INSULIN ASPART 100 UNIT/ML ~~LOC~~ SOLN
0.0000 [IU] | SUBCUTANEOUS | Status: DC
  Administered 2020-08-17: 1 [IU] via SUBCUTANEOUS
  Administered 2020-08-17 (×2): 2 [IU] via SUBCUTANEOUS
  Administered 2020-08-17 – 2020-08-18 (×2): 1 [IU] via SUBCUTANEOUS
  Administered 2020-08-18: 2 [IU] via SUBCUTANEOUS
  Administered 2020-08-18: 1 [IU] via SUBCUTANEOUS
  Administered 2020-08-18: 2 [IU] via SUBCUTANEOUS
  Administered 2020-08-18: 1 [IU] via SUBCUTANEOUS
  Administered 2020-08-18: 2 [IU] via SUBCUTANEOUS
  Administered 2020-08-19 (×4): 1 [IU] via SUBCUTANEOUS

## 2020-08-17 MED ORDER — LORAZEPAM 2 MG/ML IJ SOLN: 0.5000 mg | Freq: Two times a day (BID) | INTRAMUSCULAR | Status: DC | PRN

## 2020-08-17 NOTE — Progress Notes (Signed)
Notified daughter Geryl Rankins Scales to make aware of behaviors during the night and refusal of replacing NG tube. Pt asleep at this time vitals stable. Medium amount of brown stool noted in colostomy bag.

## 2020-08-17 NOTE — Progress Notes (Signed)
PROGRESS NOTE    Michelle Wall  WNU:[id number] DOB: 10-25-1933 DOA: 08/08/2020 PCP: Mirna Mires, MD    Brief Narrative:  Michelle Wall  is a 86 y.o. 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  Patient reports that the pain started at 4:30 PM on 08/08/2020.  She reports it was gradual in onset, but eventually became severe.  The pain is located across both upper quadrants.  It does not radiate.  Is been constant since it started.  It feels like crampy and sharp.  Patient has associated emesis 3 times that was nonbloody.  She reports that her last normal bowel movement was the morning of 08/08/2020.  She had a normal appetite and felt normal throughout the day until the pain started at 4:30 PM.  Nothing has made the pain better.  Palpation makes it worse.  Patient denies any associated fever, chest pain, palpitations, shortness of breath.  Patient is a current smoker and smokes a pack a day.  She declines nicotine patch at this time.  Of note approximately 2 years ago patient had perforated bowel, with colectomy and left lower quadrant colostomy.  Patient reports no bloody or abnormal output in her colostomy bag.  In the ED Temperature 98.7, heart rate 92, respiratory rate 14, blood pressure 163/76, saturating 100% No leukocytosis with a white blood cell count of 9.3 CHEM panel reveals hypokalemia at 2.8, hyperglycemia at 170 Gap 14 Lipase 34 Covid test pending CT abdomen pelvis shows closed-loop small bowel obstruction involving the mid jejunum with twisting of the mesenteric vessels consistent with midgut volvulus Cefotetan, fentanyl, Zofran x2, 20 mEq of potassium given in the ED Chest x-ray pending   Assessment & Plan:   Principal Problem:   Volvulus of jejunum (HCC) Active Problems:   Small  bowel obstruction (HCC)   Brief Summary:-  86 y.o. female, with history of seizures, nephrolithiasis, deviated septum, hypertension, hypothyroidism, and history of perforated colon status post colectomy and colostomy back in 2019 readmitted on 08/08/2020 with abdominal pain and concerns for SBO in the setting of volvulus and adhesions, on 08/09/20 patient underwent  Exploratory laparotomy and lysis of adhesions, reduction of small bowel volvulus , postoperatively developed ileus versus SBO, now requiring NG tube (replaced 08/14/20) and TPN (since 08/16/20) -NG out on 08/17/2020   A/p   1)Postop ileus Versus SBO ---------on 08/09/20 patient underwent  Exploratory laparotomy and lysis of adhesions, reduction of small bowel volvulus , postoperatively developed ileus versus SBO, now requiring NG tube (replaced 08/14/20) and TPN (since 08/16/20) -Patient pulled NG out early on 08/17/2020 -CT abdomen and pelvis from 08/15/2020 with possible SBO --starting to have scant amount of material in colostomy bag -General surgery following -Abdominal x-rays on 08/17/2020 without obstructive time  2)FEN--- hypokalemia and hypophosphatemia noted-replace and recheck  --Magnesium WNL at this time ---Pt is high risk for refeeding syndrome,   3) Hypothyroidism--. Resume Synthroid orally once able to take p.o; for now will use IV Synthroid (half of home dose)  4). Hyperglycemia. A1c 5.2. Possible stress response.  Continue to follow CBGs every 4-6 hours until able to tolerate by mouth. -While n.p.o. patient developed borderline hypoglycemia, was started on dextrose solution pending initiation of TPN on 08/16/2020  5). Hypertension. Currently n.p.o., continue the use of IV metoprolol and as needed hydralazine as needed.  Blood pressure currently stable.  6).  SVT.  Patient became tachycardic with a heart rate in the 170s.  She received IV metoprolol and Cardizem with improvement of heart rate and has remained stable for  over 24 hours now. Follow-up EKG shows sinus rhythm with PAC/PVC.  Rate has remained controlled and will discontinue telemetry at this time.  7)  Anemia, possibly related to GI blood loss.  Noted to have dark-colored output from NG tube.  May be related to NG tube trauma.  Started on Protonix.  Continue to follow serial hemoglobins.  - Will transfuse for hemoglobin less than 7  8)  Chronic respiratory failure with hypoxia -Chronically using 2-3 L supplementation -Stable and at baseline at this moment -Continue support and follow clinical response.  DVT prophylaxis: SCD's Start: 08/09/20 0355  Code Status: Full code Family Communication: Discussed with daughter at bedside  Disposition Plan: Status is: Inpatient  Remains inpatient appropriate because:Inpatient level of care appropriate due to severity of illness   Dispo: The patient is from: Home              Anticipated d/c is to: Home (hopefully; PT will be asked to work with patient in case she needs rehabilitation prior to go home).              Anticipated d/c date is: 2 days or so.              Patient currently is not medically stable to d/c.  Still waiting for bowel function to return.-And tolerance of oral intake  Consultants:   General surgery  Procedures:   9/17 exploratory laparotomy with lysis of adhesions  Antimicrobials:   None   Subjective: -Patient was very confused overnight, pulled out NG tube, she was restless and required IV Ativan -Daughter at bedside questions answered  Objective: Vitals:   08/17/20 0300 08/17/20 0708 08/17/20 1128 08/17/20 1519  BP: 140/86 131/66 124/61 (!) 146/80  Pulse: (!) 107 91 89 91  Resp: 20 18  19   Temp: 97.8 F (36.6 C) (!) 97.5 F (36.4 C)  98.6 F (37 C)  TempSrc: Oral Oral    SpO2: 98% 99%  96%  Weight:      Height:        Intake/Output Summary (Last 24 hours) at 08/17/2020 1530 Last data filed at 08/17/2020 0300 Gross per 24 hour  Intake 386.3 ml  Output  550 ml  Net -163.7 ml   Filed Weights   08/13/20 0554 08/15/20 0500 08/16/20 0515  Weight: 63.8 kg 62 kg 66.9 kg    Examination: General exam: -More cooperative, less confused today  respiratory system: Diminished breath sounds, no wheezing  cardiovascular system:RRR. No murmurs, rubs, gallops.    Gastrointestinal system: Abdomen is nondistended, soft , appropriate postop/incisional discomfort on palpation--- ostomy bag in place (formed stool noted),  +Bs Central nervous system: Less confused, less disoriented no focal neurological deficits. Extremities: No cyanosis or clubbing. Skin: No rashes, lesions or ulcers Psychiatry: Episodes of confusion and disorientation overnight  Data Reviewed: I have personally reviewed following labs and imaging studies  CBC: Recent Labs  Lab 08/12/20 0707 08/13/20 0511 08/14/20 0414 08/15/20 0725 08/17/20 0700  WBC 11.1* 8.7 10.2 17.7* 14.7*  NEUTROABS  --   --   --   --  11.6*  HGB 8.8* 10.2* 10.5* 9.5* 8.8*  HCT 28.2* 31.7* 31.3* 29.2* 27.4*  MCV 99.3 98.8 96.0 98.3 97.2  PLT 172 211 271 296 292   Basic Metabolic Panel: Recent Labs  Lab 08/11/20 0747 08/12/20 0707 08/13/20 0511 08/14/20 0414 08/17/20 0700  NA 141 139 137 139 145  K 3.3* 3.6 3.2* 3.6 3.2*  CL 108 106 101 98 107  CO2 27 26 26 25 28   GLUCOSE 102* 93 90 94 171*  BUN 26* 14 14 23 18   CREATININE 0.56 0.55 0.55 0.78 0.69  CALCIUM 8.1* 8.2* 8.3* 8.7* 8.6*  MG  --  2.1  --   --  2.4  PHOS  --  2.2* 2.3* 3.0 1.0*   GFR: Estimated Creatinine Clearance: 47.2 mL/min (by C-G formula based on SCr of 0.69 mg/dL).   Liver Function Tests: Recent Labs  Lab 08/11/20 0747 08/17/20 0700  AST 13* 12*  ALT 10 11  ALKPHOS 27* 32*  BILITOT 0.7 0.5  PROT 5.7* 6.0*  ALBUMIN 2.7* 2.5*   No results for input(s): LIPASE, AMYLASE in the last 168 hours. CBG: Recent Labs  Lab 08/16/20 1121 08/17/20 0013 08/17/20 0311 08/17/20 0727 08/17/20 1102  GLUCAP 99 155* 159* 159*  130*    Recent Results (from the past 240 hour(s))  SARS Coronavirus 2 by RT PCR (hospital order, performed in [facility] hospital lab) Nasopharyngeal Nasopharyngeal Swab     Status: None   Collection Time: 08/09/20  3:02 AM   Specimen: Nasopharyngeal Swab  Result Value Ref Range Status   SARS Coronavirus 2 NEGATIVE NEGATIVE Final    Comment: (NOTE) SARS-CoV-2 target nucleic acids are NOT DETECTED.  The SARS-CoV-2 RNA is generally detectable in upper and lower respiratory specimens during the acute phase of infection. The lowest concentration of SARS-CoV-2 viral copies this assay can detect is 250 copies / mL. A negative result does not preclude SARS-CoV-2 infection and should not be used as the sole basis for treatment or other patient management decisions.  A negative result may occur with improper specimen collection / handling, submission of specimen other than nasopharyngeal swab, presence of viral mutation(s) within the areas targeted by this assay, and inadequate number of viral copies (<250 copies / mL). A negative result must be combined with clinical observations, patient history, and epidemiological information.  Fact Sheet for Patients:   [facility]  Fact Sheet for Healthcare Providers: 08/11/20  This test is not yet approved or  cleared by the [employer] FDA and has been authorized for detection and/or diagnosis of SARS-CoV-2 by FDA under an Emergency Use Authorization (EUA).  This EUA will remain in effect (meaning this test can be used) for the duration of the COVID-19 declaration under Section 564(b)(1) of the Act, 21 U.S.C. section 360bbb-3(b)(1), unless the authorization is terminated or revoked sooner.  Performed at [facility], [address] [place], [address] [place]      Radiology Studies: DG ABD ACUTE 2+V W 1V CHEST  Result Date: 08/17/2020 CLINICAL DATA:  Chest and abdominal pain  EXAM: ACUTE ABDOMEN SERIES (2 VIEW ABDOMEN AND 1 VIEW CHEST) COMPARISON:  08/14/2020 FINDINGS: Cardiac shadow is mildly enlarged. Aortic calcifications are seen. Right-sided PICC line is noted at the cavoatrial junction. The lungs are well aerated bilaterally. Small effusions are again identified. Patchy bibasilar airspace opacities are noted consistent with developing infiltrate. Dense granuloma is noted in the right upper lobe. Postsurgical changes are noted in the abdomen. Scattered large and small bowel gas is noted. No definitive obstructive changes are seen. No free air is noted. Ostomy is noted on the left. Degenerative changes of lumbar spine are seen. IMPRESSION: Patchy airspace opacities in the bases bilaterally with  small effusions. Postoperative changes in the abdomen without acute abnormality. Electronically Signed   By: Alcide CleverMark  Lukens M.D.   On: 08/17/2020 12:57   US EKG SITE RITE  Result Date: 08/16/2020 If Site Rite image not attached, placement could not be confirmed due to current cardiac rhythm.  Scheduled Meds:  Chlorhexidine Gluconate Cloth  6 each Topical Daily   insulin aspart  0-9 Units Subcutaneous Q4H   levothyroxine  50 mcg Intravenous Daily   metoprolol tartrate  5 mg Intravenous Q6H   pantoprazole  40 mg Oral Q1200   potassium chloride  40 mEq Oral BID   Continuous Infusions:  famotidine (PEPCID) IV 20 mg (08/17/20 1436)   potassium PHOSPHATE IVPB (in mmol)     TPN ADULT (ION) 40 mL/hr at 08/16/20 1818   TPN ADULT (ION)       LOS: 8 days   Shon Haleourage Lynae Pederson, MD [facility]   If 7PM-7AM, please contact night-coverage [email or web address]  08/17/2020, 3:30 PM

## 2020-08-17 NOTE — Progress Notes (Signed)
Bed alarm going off, patient noted to be on side of bed, NG tube and O2 off. Stating that she was looking for her glasses. Appeared slightly confused when explaining she pull her NG tube out stating that she had not had it in two days and she wanted some water. Advised on NPO orders and attempted to reorient. Notified Dr. Herma Carson to make aware and stated ok to replace NG tube and chest x-ray for confirmation. When going in to replace, patient refusing stated that she did not want it back in, states in was pressing on her nose and making her throat hurt. Notified Dr. Herma Carson of this and stated ok to replace when patient is willing.

## 2020-08-17 NOTE — Progress Notes (Signed)
PHARMACY - TOTAL PARENTERAL NUTRITION CONSULT NOTE   Indication: Prolonged ileus  Patient Measurements: Height: 5\' 3"  (160 cm) Weight: 66.9 kg (147 lb 7.8 oz) IBW/kg (Calculated) : 52.4 TPN AdjBW (KG): 55.7 Body mass index is 26.13 kg/m. Usual Weight: 67 kg  Assessment:  Per RD note: Pt is POD#8 exploratory laparotomy and lysis of adhesions with reduction of volvulus. Ongoing ileus, intermittent nausea, with 150 ml green emesis noted on 9/22. NG tube placed with significant output. She has been NPO since admit, starting TPN 9/24. She is at high risk for refeeding, recommend monitoring magnesium, potassium, and phosphorus daily  Glucose / Insulin:  RBGs 82-99-->   Initiating SSI (sensitive scale) q4h Electrolytes:  WNL Renal:  CrClest~47 mL/min   LFTs / TGs: pending Prealbumin / albumin: pending   Intake/Output from previous day: 09/22 0701 - 09/23 0700 In: 796.9 [I.V.:796.9] Out: 3600 [Emesis/NG output:3600] Intake/Output this shift: No intake/output data recorded.   MIVF:    none GI Imaging:  9/23 CT abd/pelvis w/contrast Surgeries / Procedures:  Pt is POD#7 exploratory laparotomy and lysis of adhesions with reduction of volvulus.  Central access:  Double lumen PICC placed 08/15/20 TPN start date: 08/16/20 at 1800  Nutritional Goals (per RD recommendation on 9/24) kCal: 1525-1750   Protein: 87-100g   Fluid:  >/= 1.5L Goal TPN rate is 65 mL/hr (provides ~87 g of protein and ~1600 kcals per day)  Current Nutrition:  NG tube pt refusing   Plan:  Increase TPN to goal rate of 80mL/hr at 1800 Electrolytes in TPN: 105mEq/L of Na, from 11mEq/L increase to 50mEq/L of K, 76mEq/L of Ca, 71mEq/L of Mg, and from 72mmol/L increase to 50mmol/L of Phos.  Cl:Ac ratio 1:1 Add standard MVI and trace elements to TPN Continue Sensitive q4h SSI and adjust as needed  Monitor TPN labs tomorrow and on Mon/Thurs   30m Michelle Wall 08/17/2020,10:34 AM

## 2020-08-17 NOTE — Progress Notes (Signed)
Patient attempting to walk around room unsafely with PICC line connected to TPN. Became more agitated when tech attempted to redirect and assumed tech was trying to hit her. Explained we were only trying to help her from pulling out line and hurting herself. She then sat on edge of bed refusing care. Notified Dr. Herma Carson to make aware. New order for Ativan 1mg , administered as directed.

## 2020-08-17 NOTE — Progress Notes (Signed)
8 Days Post-Op  Subjective: Events of last night noted.  Patient pulled out her NG tube.  Is starting to have output from her NG tube.  Is hallucinating.  Objective: Vital signs in last 24 hours: Temp:  [97.5 F (36.4 C)-98.6 F (37 C)] 97.5 F (36.4 C) (09/25 0708) Pulse Rate:  [86-129] 91 (09/25 0708) Resp:  [16-20] 18 (09/25 0708) BP: (116-142)/(66-93) 131/66 (09/25 0708) SpO2:  [95 %-100 %] 99 % (09/25 0708) Last BM Date: 08/16/20  Intake/Output from previous day: 09/24 0701 - 09/25 0700 In: 386.3 [I.V.:342.5; IV Piggyback:43.8] Out: 550 [Emesis/NG output:550] Intake/Output this shift: No intake/output data recorded.  General appearance: delirious and no distress GI: Soft, incision healing well.  Colostomy with stool in bag.  Bowel sounds active.  Lab Results:  Recent Labs    08/15/20 0725 08/17/20 0700  WBC 17.7* 14.7*  HGB 9.5* 8.8*  HCT 29.2* 27.4*  PLT 296 292   BMET Recent Labs    08/17/20 0700  NA 145  K 3.2*  CL 107  CO2 28  GLUCOSE 171*  BUN 18  CREATININE 0.69  CALCIUM 8.6*   PT/INR No results for input(s): LABPROT, INR in the last 72 hours.  Studies/Results: CT ABDOMEN PELVIS W CONTRAST  Result Date: 08/15/2020 CLINICAL DATA:  Postoperative, bowel obstruction suspected EXAM: CT ABDOMEN AND PELVIS WITH CONTRAST TECHNIQUE: Multidetector CT imaging of the abdomen and pelvis was performed using the standard protocol following bolus administration of intravenous contrast. CONTRAST:  OMNIPAQUE IOHEXOL 300 MG/ML SOLN, additional oral enteric contrast COMPARISON:  08/09/2020 FINDINGS: Lower chest: Small bilateral pleural effusions associated atelectasis or consolidation, increased compared to prior examination. Three-vessel coronary artery calcifications. Hepatobiliary: No solid liver abnormality is seen. No gallstones, gallbladder wall thickening, or biliary dilatation. Pancreas: Unremarkable. No pancreatic ductal dilatation or surrounding  inflammatory changes. Spleen: Normal in size without significant abnormality. Adrenals/Urinary Tract: Adrenal glands are unremarkable. Bilateral renal cysts. Kidneys are otherwise normal, without renal calculi, solid lesion, or hydronephrosis. Bladder is unremarkable. Stomach/Bowel: Esophagogastric tube is positioned with tip in the gastric fundus, side port at the level of the gastroesophageal junction. Status post Gertie Gowda procedure sigmoid colon resection with left lower quadrant end colostomy and rectal stump. 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 There is however an abrupt caliber change of the mid to distal small bowel in the central abdomen (series 2, image 50). The distal small bowel is completely decompressed. Moderate burden of stool in the colon. Vascular/Lymphatic: Aortic atherosclerosis. No enlarged abdominal or pelvic lymph nodes. Reproductive: No mass or other significant abnormality. Other: No abdominal wall hernia or abnormality. Status post midline laparotomy. Small volume ascites, similar to prior examination. Musculoskeletal: No acute or significant osseous findings. IMPRESSION: 1. Redemonstrated postoperative findings status post Hartmann procedure sigmoid colon resection with left lower quadrant end colostomy and rectal stump. 2. 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 There is however an abrupt caliber change of the mid to distal small bowel in the central abdomen. The distal small bowel is completely decompressed. Findings are concerning for small bowel obstruction secondary to adhesion. 3. Esophagogastric tube is positioned with tip in the gastric fundus, side port at the level of the gastroesophageal junction. Recommend advancement to ensure subdiaphragmatic positioning. 4. Small volume ascites, similar to prior  examination. 5. Small bilateral pleural  effusions associated atelectasis or consolidation, increased compared to prior examination. 6. Coronary artery disease.  Aortic Atherosclerosis (ICD10-I70.0). These results will be called to the ordering clinician or representative by the Radiologist Assistant, and communication documented in the PACS or [employer]. Electronically Signed   By: Lauralyn Primes M.D.   On: 08/15/2020 12:17   [place] EKG SITE RITE  Result Date: 08/16/2020 If Site Rite image not attached, placement could not be confirmed due to current cardiac rhythm.   Anti-infectives: Anti-infectives (From admission, onward)   Start     Dose/Rate Route Frequency Ordered Stop   08/09/20 0600  cefoTEtan (CEFOTAN) 2 g in sodium chloride 0.9 % 100 mL IVPB        2 g 200 mL/hr over 30 Minutes Intravenous On call to O.R. 08/09/20 0354 08/09/20 0742      Assessment/Plan: s/p Procedure(s): EXPLORATORY LAPAROTOMY LYSIS OF ADHESIONS Impression: Postoperative day 8.  GI function seems to be returning.  Given patient's confusion, would not replace NG tube at this time.  Will check abdominal films.  Patient may have sips of liquids.  Explained to the patient's family her current condition.  Discussed with Dr. Mariea Clonts.  LOS: 8 days    Franky Macho 08/17/2020

## 2020-08-17 NOTE — Progress Notes (Signed)
CRITICAL VALUE ALERT  Critical Value:  Phosphorus 1.0  Date & Time Notied:  08/17/2020 4496  Provider Notified: 08/17/2020 1000  Orders Received/Actions taken: No new orders at this time

## 2020-08-18 LAB — MAGNESIUM: Magnesium: 2 mg/dL (ref 1.7–2.4)

## 2020-08-18 LAB — COMPREHENSIVE METABOLIC PANEL
ALT: 18 U/L (ref 0–44)
AST: 24 U/L (ref 15–41)
Albumin: 2.6 g/dL — ABNORMAL LOW (ref 3.5–5.0)
Alkaline Phosphatase: 38 U/L (ref 38–126)
Anion gap: 8 (ref 5–15)
BUN: 19 mg/dL (ref 8–23)
CO2: 25 mmol/L (ref 22–32)
Calcium: 8.9 mg/dL (ref 8.9–10.3)
Chloride: 111 mmol/L (ref 98–111)
Creatinine, Ser: 0.59 mg/dL (ref 0.44–1.00)
GFR calc Af Amer: >60 mL/min (ref >60)
GFR calc non Af Amer: >60 mL/min (ref >60)
Glucose, Bld: 128 mg/dL — ABNORMAL HIGH (ref 70–99)
Potassium: 3.8 mmol/L (ref 3.5–5.1)
Sodium: 144 mmol/L (ref 135–145)
Total Bilirubin: 0.5 mg/dL (ref 0.3–1.2)
Total Protein: 6.5 g/dL (ref 6.5–8.1)

## 2020-08-18 LAB — GLUCOSE, CAPILLARY
Glucose-Capillary: 126 mg/dL — ABNORMAL HIGH (ref 70–99)
Glucose-Capillary: 133 mg/dL — ABNORMAL HIGH (ref 70–99)
Glucose-Capillary: 144 mg/dL — ABNORMAL HIGH (ref 70–99)
Glucose-Capillary: 146 mg/dL — ABNORMAL HIGH (ref 70–99)
Glucose-Capillary: 154 mg/dL — ABNORMAL HIGH (ref 70–99)
Glucose-Capillary: 155 mg/dL — ABNORMAL HIGH (ref 70–99)
Glucose-Capillary: 169 mg/dL — ABNORMAL HIGH (ref 70–99)

## 2020-08-18 LAB — PHOSPHORUS: Phosphorus: 1.5 mg/dL — ABNORMAL LOW (ref 2.5–4.6)

## 2020-08-18 MED ORDER — TRAVASOL 10 % IV SOLN
INTRAVENOUS | Status: DC
  Filled 2020-08-18: qty 702

## 2020-08-18 MED ORDER — LEVOTHYROXINE SODIUM 100 MCG PO TABS
100.0000 ug | ORAL_TABLET | Freq: Every day | ORAL | Status: DC
  Administered 2020-08-19: 100 ug via ORAL
  Filled 2020-08-18: qty 1

## 2020-08-18 MED ORDER — NICOTINE 14 MG/24HR TD PT24
14.0000 mg | MEDICATED_PATCH | Freq: Every day | TRANSDERMAL | Status: DC
  Administered 2020-08-18 – 2020-08-19 (×2): 14 mg via TRANSDERMAL
  Filled 2020-08-18 (×2): qty 1

## 2020-08-18 NOTE — Progress Notes (Signed)
PROGRESS NOTE    Aften CIRA DEYOE  XIP:[id number] DOB: 1933-05-04 DOA: 08/08/2020 PCP: Mirna Mires, MD    Brief Narrative:  Kriston Janowski  is a 86 y.o. 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  Patient reports that the pain started at 4:30 PM on 08/08/2020.  She reports it was gradual in onset, but eventually became severe.  The pain is located across both upper quadrants.  It does not radiate.  Is been constant since it started.  It feels like crampy and sharp.  Patient has associated emesis 3 times that was nonbloody.  She reports that her last normal bowel movement was the morning of 08/08/2020.  She had a normal appetite and felt normal throughout the day until the pain started at 4:30 PM.  Nothing has made the pain better.  Palpation makes it worse.  Patient denies any associated fever, chest pain, palpitations, shortness of breath.  Patient is a current smoker and smokes a pack a day.  She declines nicotine patch at this time.  Of note approximately 2 years ago patient had perforated bowel, with colectomy and left lower quadrant colostomy.  Patient reports no bloody or abnormal output in her colostomy bag.  In the ED Temperature 98.7, heart rate 92, respiratory rate 14, blood pressure 163/76, saturating 100% No leukocytosis with a white blood cell count of 9.3 CHEM panel reveals hypokalemia at 2.8, hyperglycemia at 170 Gap 14 Lipase 34 Covid test pending CT abdomen pelvis shows closed-loop small bowel obstruction involving the mid jejunum with twisting of the mesenteric vessels consistent with midgut volvulus Cefotetan, fentanyl, Zofran x2, 20 mEq of potassium given in the ED Chest x-ray pending   Assessment & Plan:   Principal Problem:   Volvulus of jejunum (HCC) Active Problems:   Small  bowel obstruction (HCC)   Brief Summary:-  86 y.o. female, with history of seizures, nephrolithiasis, deviated septum, hypertension, hypothyroidism, and history of perforated colon status post colectomy and colostomy back in 2019 readmitted on 08/08/2020 with abdominal pain and concerns for SBO in the setting of volvulus and adhesions, on 08/09/20 patient underwent  Exploratory laparotomy and lysis of adhesions, reduction of small bowel volvulus , postoperatively developed ileus versus SBO, now requiring NG tube (replaced 08/14/20) and TPN (since 08/16/20) -NG out on 08/17/2020   A/p   1)Postop ileus Versus SBO ---------on 08/09/20 patient underwent  Exploratory laparotomy and lysis of adhesions, reduction of small bowel volvulus , postoperatively developed ileus versus SBO, now requiring NG tube (replaced 08/14/20) and TPN (since 08/16/20) -Patient pulled NG out early on 08/17/2020 -CT abdomen and pelvis from 08/15/2020 with possible SBO --starting to have scant amount of material in colostomy bag -General surgery following -Abdominal x-rays on 08/17/2020 without obstructive findings -Okay to advance diet to full liquid diet on 08/18/20 -Continue IV TPN  2)FEN--- hypokalemia and hypophosphatemia noted-replace and recheck  --Magnesium WNL at this time ---Pt is high risk for refeeding syndrome,   3) Hypothyroidism--.  Change IV Synthroid to p.o.  4). Hyperglycemia. A1c 5.2. Possible stress response.  Continue to follow CBGs every 4-6 hours until able to tolerate by mouth. -While n.p.o. patient developed borderline hypoglycemia, was started on dextrose solution pending initiation of TPN on 08/16/2020  5). Hypertension. Currently n.p.o., continue the use of IV metoprolol and as needed hydralazine as needed.  Blood pressure currently stable.  6). SVT---  Patient became tachycardic with a heart rate in the 170s.  She received IV metoprolol and Cardizem with improvement of heart rate and has remained  stable for over 24 hours now. Follow-up EKG shows sinus rhythm with PAC/PVC.  Rate has remained controlled and will discontinue telemetry at this time.  7)  Anemia, possibly related to GI blood loss.  Noted to have dark-colored output from NG tube.  May be related to NG tube trauma.  Started on Protonix.  Continue to follow serial hemoglobins.  - Will transfuse for hemoglobin less than 7  8)  Chronic respiratory failure with hypoxia -Chronically using 2-3 L supplementation -Stable and at baseline at this moment -Continue support and follow clinical response.  DVT prophylaxis: SCD's Start: 08/09/20 0355  Code Status: Full code Family Communication: Discussed with daughter at bedside  Disposition Plan: Status is: Inpatient  Remains inpatient appropriate because:Inpatient level of care appropriate due to severity of illness   Dispo: The patient is from: Home              Anticipated d/c is to: Home (hopefully; PT will be asked to work with patient in case she needs rehabilitation prior to go home).              Anticipated d/c date is: 2 days or so.              Patient currently is not medically stable to d/c.  Still waiting for bowel function to return.-And tolerance of oral intake  Consultants:   General surgery  Procedures:   9/17 exploratory laparotomy with lysis of adhesions  Antimicrobials:   None   Subjective:  -More appropriate, more coherent -Tolerating clear liquid okay -Daughter at bedside  Objective: Vitals:   08/17/20 2038 08/17/20 2340 08/18/20 0507 08/18/20 1237  BP:  (!) 143/84 (!) 159/82 (!) 141/78  Pulse: (!) 106 71 100 (!) 107  Resp:   18   Temp:      TempSrc:      SpO2:  100% 100%   Weight:      Height:        Intake/Output Summary (Last 24 hours) at 08/18/2020 1613 Last data filed at 08/18/2020 1500 Gross per 24 hour  Intake 2382.97 ml  Output 400 ml  Net 1982.97 ml   Filed Weights   08/13/20 0554 08/15/20 0500 08/16/20 0515  Weight:  63.8 kg 62 kg 66.9 kg    Examination: General exam: -More cooperative, less confused today  respiratory system: Diminished breath sounds, no wheezing  cardiovascular system:RRR. No murmurs, rubs, gallops.    Gastrointestinal system: Abdomen is nondistended, soft , appropriate postop/incisional discomfort on palpation--- ostomy bag in place (formed stool noted),  +Bs Central nervous system: Less confused, less disoriented no focal neurological deficits. Extremities: No cyanosis or clubbing. Skin: No rashes, lesions or ulcers Psychiatry: Episodes of confusion and disorientation overnight  Data Reviewed: I have personally reviewed following labs and imaging studies  CBC: Recent Labs  Lab 08/12/20 0707 08/13/20 0511 08/14/20 0414 08/15/20 0725 08/17/20 0700  WBC 11.1* 8.7 10.2 17.7* 14.7*  NEUTROABS  --   --   --   --  11.6*  HGB 8.8* 10.2* 10.5* 9.5* 8.8*  HCT 28.2* 31.7* 31.3* 29.2* 27.4*  MCV 99.3 98.8 96.0 98.3 97.2  PLT 172 211 271 296 292   Basic Metabolic Panel: Recent Labs  Lab 08/12/20 0707 08/13/20 0511 08/14/20 0414 08/17/20 0700 08/18/20 0714  NA 139 137  139 145 144  K 3.6 3.2* 3.6 3.2* 3.8  CL 106 101 98 107 111  CO2 26 26 25 28 25   GLUCOSE 93 90 94 171* 128*  BUN 14 14 23 18 19   CREATININE 0.55 0.55 0.78 0.69 0.59  CALCIUM 8.2* 8.3* 8.7* 8.6* 8.9  MG 2.1  --   --  2.4 2.0  PHOS 2.2* 2.3* 3.0 1.0* 1.5*   GFR: Estimated Creatinine Clearance: 47.2 mL/min (by C-G formula based on SCr of 0.59 mg/dL).   Liver Function Tests: Recent Labs  Lab 08/17/20 0700 08/18/20 0714  AST 12* 24  ALT 11 18  ALKPHOS 32* 38  BILITOT 0.5 0.5  PROT 6.0* 6.5  ALBUMIN 2.5* 2.6*   No results for input(s): LIPASE, AMYLASE in the last 168 hours. CBG: Recent Labs  Lab 08/17/20 1951 08/18/20 0021 08/18/20 0423 08/18/20 0734 08/18/20 1138  GLUCAP 151* 154* 155* 126* 169*    Recent Results (from the past 240 hour(s))  SARS Coronavirus 2 by RT PCR (hospital order,  performed in [facility] lab) Nasopharyngeal Nasopharyngeal Swab     Status: None   Collection Time: 08/09/20  3:02 AM   Specimen: Nasopharyngeal Swab  Result Value Ref Range Status   SARS Coronavirus 2 NEGATIVE NEGATIVE Final    Comment: (NOTE) SARS-CoV-2 target nucleic acids are NOT DETECTED.  The SARS-CoV-2 RNA is generally detectable in upper and lower respiratory specimens during the acute phase of infection. The lowest concentration of SARS-CoV-2 viral copies this assay can detect is 250 copies / mL. A negative result does not preclude SARS-CoV-2 infection and should not be used as the sole basis for treatment or other patient management decisions.  A negative result may occur with improper specimen collection / handling, submission of specimen other than nasopharyngeal swab, presence of viral mutation(s) within the areas targeted by this assay, and inadequate number of viral copies (<250 copies / mL). A negative result must be combined with clinical observations, patient history, and epidemiological information.  Fact Sheet for Patients:   BoilerBrush.com.cyhttps://www.fda.gov/media/136312/download  Fact Sheet for Healthcare Providers: https://pope.com/https://www.fda.gov/media/136313/download  This test is not yet approved or  cleared by the [place] FDA and has been authorized for detection and/or diagnosis of SARS-CoV-2 by FDA under an Emergency Use Authorization (EUA).  This EUA will remain in effect (meaning this test can be used) for the duration of the COVID-19 declaration under Section 564(b)(1) of the Act, 21 U.S.C. section 360bbb-3(b)(1), unless the authorization is terminated or revoked sooner.  Performed at [facility], [address] [place], [place] [address]      Radiology Studies: DG ABD ACUTE 2+V W 1V CHEST  Result Date: 08/17/2020 CLINICAL DATA:  Chest and abdominal pain EXAM: ACUTE ABDOMEN SERIES (2 VIEW ABDOMEN AND 1 VIEW CHEST) COMPARISON:  08/14/2020 FINDINGS:  Cardiac shadow is mildly enlarged. Aortic calcifications are seen. Right-sided PICC line is noted at the cavoatrial junction. The lungs are well aerated bilaterally. Small effusions are again identified. Patchy bibasilar airspace opacities are noted consistent with developing infiltrate. Dense granuloma is noted in the right upper lobe. Postsurgical changes are noted in the abdomen. Scattered large and small bowel gas is noted. No definitive obstructive changes are seen. No free air is noted. Ostomy is noted on the left. Degenerative changes of lumbar spine are seen. IMPRESSION: Patchy airspace opacities in the bases bilaterally with small effusions. Postoperative changes in the abdomen without acute abnormality. Electronically Signed   By: Eulah PontMark  Lukens M.D.  On: 08/17/2020 12:57   Scheduled Meds: . Chlorhexidine Gluconate Cloth  6 each Topical Daily  . insulin aspart  0-9 Units Subcutaneous Q4H  . levothyroxine  50 mcg Intravenous Daily  . metoprolol tartrate  5 mg Intravenous Q6H  . pantoprazole  40 mg Oral Q1200  . phosphorus  250 mg Oral TID  . potassium chloride  40 mEq Oral BID   Continuous Infusions: . famotidine (PEPCID) IV 20 mg (08/18/20 1431)  . TPN ADULT (ION) 65 mL/hr at 08/17/20 1748  . TPN ADULT (ION)       LOS: 9 days   Shon Hale, MD [facility]   If 7PM-7AM, please contact night-coverage [email or web address]  08/18/2020, 4:13 PM

## 2020-08-18 NOTE — Progress Notes (Signed)
9 Days Post-Op  Subjective: Wants to go home. No nausea or vomiting noted.  Objective: Vital signs in last 24 hours: Temp:  [98.6 F (37 C)-98.8 F (37.1 C)] 98.8 F (37.1 C) (09/25 2027) Pulse Rate:  [71-135] 100 (09/26 0507) Resp:  [18-19] 18 (09/26 0507) BP: (124-159)/(61-84) 159/82 (09/26 0507) SpO2:  [96 %-100 %] 100 % (09/26 0507) Last BM Date: 08/17/20  Intake/Output from previous day: 09/25 0701 - 09/26 0700 In: 1663 [P.O.:360; I.V.:1253; IV Piggyback:50] Out: 400 [Urine:400] Intake/Output this shift: Total I/O In: 360 [P.O.:360] Out: -   General appearance: cooperative and no distress GI: Soft, incision healing well. Gas and stool in ostomy bag.  Lab Results:  Recent Labs    08/17/20 0700  WBC 14.7*  HGB 8.8*  HCT 27.4*  PLT 292   BMET Recent Labs    08/17/20 0700 08/18/20 0714  NA 145 144  K 3.2* 3.8  CL 107 111  CO2 28 25  GLUCOSE 171* 128*  BUN 18 19  CREATININE 0.69 0.59  CALCIUM 8.6* 8.9   PT/INR No results for input(s): LABPROT, INR in the last 72 hours.  Studies/Results: DG ABD ACUTE 2+V W 1V CHEST  Result Date: 08/17/2020 CLINICAL DATA:  Chest and abdominal pain EXAM: ACUTE ABDOMEN SERIES (2 VIEW ABDOMEN AND 1 VIEW CHEST) COMPARISON:  08/14/2020 FINDINGS: Cardiac shadow is mildly enlarged. Aortic calcifications are seen. Right-sided PICC line is noted at the cavoatrial junction. The lungs are well aerated bilaterally. Small effusions are again identified. Patchy bibasilar airspace opacities are noted consistent with developing infiltrate. Dense granuloma is noted in the right upper lobe. Postsurgical changes are noted in the abdomen. Scattered large and small bowel gas is noted. No definitive obstructive changes are seen. No free air is noted. Ostomy is noted on the left. Degenerative changes of lumbar spine are seen. IMPRESSION: Patchy airspace opacities in the bases bilaterally with small effusions. Postoperative changes in the abdomen  without acute abnormality. Electronically Signed   By: Alcide Clever M.D.   On: 08/17/2020 12:57    Anti-infectives: Anti-infectives (From admission, onward)   Start     Dose/Rate Route Frequency Ordered Stop   08/09/20 0600  cefoTEtan (CEFOTAN) 2 g in sodium chloride 0.9 % 100 mL IVPB        2 g 200 mL/hr over 30 Minutes Intravenous On call to O.R. 08/09/20 0354 08/09/20 0742      Assessment/Plan: s/p Procedure(s): EXPLORATORY LAPAROTOMY LYSIS OF ADHESIONS Impression: Bowel function has returned. Will advance to full liquid diet. Continue TPN for now. Hypophosphatemia being addressed. Anticipate discharge in next 24 to 48 hours.  LOS: 9 days    Franky Macho 08/18/2020

## 2020-08-18 NOTE — Progress Notes (Addendum)
PHARMACY - TOTAL PARENTERAL NUTRITION CONSULT NOTE   Indication: Prolonged ileus  Patient Measurements: Height: 5\' 3"  (160 cm) Weight: 66.9 kg (147 lb 7.8 oz) IBW/kg (Calculated) : 52.4 TPN AdjBW (KG): 55.7 Body mass index is 26.13 kg/m. Usual Weight: 67 kg  Assessment:  Per RD note: Pt is POD#9 exploratory laparotomy and lysis of adhesions with reduction of volvulus. Ongoing ileus, intermittent nausea, with 150 ml green emesis noted on 9/22. NG tube placed with significant output. She has been NPO since admit, starting TPN 9/24. She is at high risk for refeeding, recommend monitoring magnesium, potassium, and phosphorus daily  Glucose / Insulin:  RBGs 82-99-->   Initiating SSI (sensitive scale) q4h Electrolytes:  WNL Renal:  CrClest~47 mL/min   LFTs / TGs: pending Prealbumin / albumin: pending   Intake/Output from previous day: 09/22 0701 - 09/23 0700 In: 796.9 [I.V.:796.9] Out: 3600 [Emesis/NG output:3600] Intake/Output this shift: No intake/output data recorded.   MIVF:    none GI Imaging:  9/23 CT abd/pelvis w/contrast Surgeries / Procedures:  Pt is POD#7 exploratory laparotomy and lysis of adhesions with reduction of volvulus.  Central access:  Double lumen PICC placed 08/15/20 TPN start date: 08/16/20 at 1800  Nutritional Goals (per RD recommendation on 9/24) kCal: 1525-1750   Protein: 87-100g   Fluid:  >/= 1.5L Goal TPN rate is 65 mL/hr (provides ~87 g of protein and ~1600 kcals per day)  Current Nutrition:  NG tube pt refusing   Plan:  Increase TPN to goal rate of 29mL/hr at 1800 Electrolytes in TPN: 62mEq/L of Na, 65mEq/L of K, 22mEq/L of Ca, 89mEq/L of Mg, and from 30mmol/L increase to 20mmol/L of Phos.  Cl:Ac ratio 1:1 Add standard MVI and trace elements to TPN Continue Sensitive q4h SSI and adjust as needed  Monitor TPN labs tomorrow and on Mon/Thurs   32m Zlatan Hornback 08/18/2020,10:09 AM

## 2020-08-19 LAB — COMPREHENSIVE METABOLIC PANEL
ALT: 154 U/L — ABNORMAL HIGH (ref 0–44)
AST: 119 U/L — ABNORMAL HIGH (ref 15–41)
Albumin: 2.5 g/dL — ABNORMAL LOW (ref 3.5–5.0)
Alkaline Phosphatase: 43 U/L (ref 38–126)
Anion gap: 10 (ref 5–15)
BUN: 18 mg/dL (ref 8–23)
CO2: 24 mmol/L (ref 22–32)
Calcium: 8.6 mg/dL — ABNORMAL LOW (ref 8.9–10.3)
Chloride: 109 mmol/L (ref 98–111)
Creatinine, Ser: 0.67 mg/dL (ref 0.44–1.00)
GFR calc Af Amer: >60 mL/min (ref >60)
GFR calc non Af Amer: >60 mL/min (ref >60)
Glucose, Bld: 120 mg/dL — ABNORMAL HIGH (ref 70–99)
Potassium: 4.2 mmol/L (ref 3.5–5.1)
Sodium: 143 mmol/L (ref 135–145)
Total Bilirubin: 0.4 mg/dL (ref 0.3–1.2)
Total Protein: 6.2 g/dL — ABNORMAL LOW (ref 6.5–8.1)

## 2020-08-19 LAB — DIFFERENTIAL
Abs Immature Granulocytes: 1.14 10*3/uL — ABNORMAL HIGH (ref 0.00–0.07)
Basophils Absolute: 0 10*3/uL (ref 0.0–0.1)
Basophils Relative: 0 %
Eosinophils Absolute: 0.2 10*3/uL (ref 0.0–0.5)
Eosinophils Relative: 1 %
Lymphocytes Relative: 14 %
Lymphs Abs: 2.4 10*3/uL (ref 0.7–4.0)
Metamyelocytes Relative: 2 %
Monocytes Absolute: 0.8 10*3/uL (ref 0.1–1.0)
Monocytes Relative: 5 %
Neutro Abs: 12.9 10*3/uL — ABNORMAL HIGH (ref 1.7–7.7)
Neutrophils Relative %: 77 %
Promyelocytes Relative: 1 %

## 2020-08-19 LAB — CBC
HCT: 29.9 % — ABNORMAL LOW (ref 36.0–46.0)
Hemoglobin: 9.5 g/dL — ABNORMAL LOW (ref 12.0–15.0)
MCH: 31.4 pg (ref 26.0–34.0)
MCHC: 31.8 g/dL (ref 30.0–36.0)
MCV: 98.7 fL (ref 80.0–100.0)
Platelets: 331 10*3/uL (ref 150–400)
RBC: 3.03 MIL/uL — ABNORMAL LOW (ref 3.87–5.11)
RDW: 14.4 % (ref 11.5–15.5)
WBC: 16.8 10*3/uL — ABNORMAL HIGH (ref 4.0–10.5)
nRBC: 0.2 % (ref 0.0–0.2)

## 2020-08-19 LAB — GLUCOSE, CAPILLARY
Glucose-Capillary: 127 mg/dL — ABNORMAL HIGH (ref 70–99)
Glucose-Capillary: 128 mg/dL — ABNORMAL HIGH (ref 70–99)
Glucose-Capillary: 131 mg/dL — ABNORMAL HIGH (ref 70–99)
Glucose-Capillary: 132 mg/dL — ABNORMAL HIGH (ref 70–99)

## 2020-08-19 LAB — TRIGLYCERIDES: Triglycerides: 88 mg/dL (ref <150)

## 2020-08-19 LAB — PHOSPHORUS: Phosphorus: 3.7 mg/dL (ref 2.5–4.6)

## 2020-08-19 LAB — PREALBUMIN: Prealbumin: 12.5 mg/dL — ABNORMAL LOW (ref 18–38)

## 2020-08-19 LAB — MAGNESIUM: Magnesium: 1.8 mg/dL (ref 1.7–2.4)

## 2020-08-19 MED ORDER — NICOTINE 14 MG/24HR TD PT24: 14.0000 mg | MEDICATED_PATCH | Freq: Every day | TRANSDERMAL | 0 refills | Status: DC

## 2020-08-19 MED ORDER — SPIRIVA HANDIHALER 18 MCG IN CAPS: 18.0000 ug | ORAL_CAPSULE | Freq: Every day | RESPIRATORY_TRACT | 2 refills | Status: AC

## 2020-08-19 MED ORDER — ASPIRIN EC 81 MG PO TBEC: 81.0000 mg | DELAYED_RELEASE_TABLET | Freq: Every day | ORAL | 11 refills | Status: AC

## 2020-08-19 MED ORDER — PANTOPRAZOLE SODIUM 40 MG PO TBEC
40.0000 mg | DELAYED_RELEASE_TABLET | Freq: Every day | ORAL | 2 refills | Status: DC
Start: 2020-08-20 — End: 2023-10-20

## 2020-08-19 MED ORDER — ONDANSETRON HCL 4 MG PO TABS: 4.0000 mg | ORAL_TABLET | Freq: Four times a day (QID) | ORAL | 0 refills | Status: DC | PRN

## 2020-08-19 MED ORDER — DOCUSATE SODIUM 100 MG PO CAPS: 100.0000 mg | ORAL_CAPSULE | Freq: Two times a day (BID) | ORAL | 2 refills | Status: DC

## 2020-08-19 NOTE — Discharge Instructions (Signed)
1) avoid constipation 2) smoking cessation strongly advised 3)you need oxygen at home at 2 L via nasal cannula continuously while awake and while asleep--- smoking or having open fires around oxygen can cause fire, significant injury and death 4) follow-up with general surgeon Dr. Henreitta Leber as advised 5)Repeat CBC and BMP blood test with Dr Henreitta Leber this Thursday

## 2020-08-19 NOTE — Progress Notes (Signed)
[facility]  Looking good. Having bowel function. On soft diet. Ok to [employer] home and will see in office 9/30.  Algis Greenhouse, MD [facility] [address] [place], [place] [address] [phone] (office)

## 2020-08-19 NOTE — Progress Notes (Signed)
Nsg Discharge Note  Admit Date:  08/08/2020 Discharge date: 08/19/2020   Michelle [facility] to be D/C'd Home per MD order.  AVS completed.  Copy for chart, and copy for patient signed, and dated. Patient/caregiver able to verbalize understanding.  Discharge Medication: Allergies as of 08/19/2020   No Known Allergies     Medication List    STOP taking these medications   hydrochlorothiazide 25 MG tablet Commonly known as: HYDRODIURIL     TAKE these medications   aspirin EC 81 MG tablet Take 1 tablet (81 mg total) by mouth daily with breakfast. What changed: when to take this   cholecalciferol 25 MCG (1000 UNIT) tablet Commonly known as: VITAMIN D3 Take 1,000 Units by mouth daily.   docusate sodium 100 MG capsule Commonly known as: COLACE Take 1 capsule (100 mg total) by mouth 2 (two) times daily. What changed:   when to take this  reasons to take this   latanoprost 0.005 % ophthalmic solution Commonly known as: XALATAN Place 1 drop into both eyes at bedtime.   nicotine 14 mg/24hr patch Commonly known as: NICODERM CQ - dosed in mg/24 hours Place 1 patch (14 mg total) onto the skin daily. Start taking on: August 20, 2020   ondansetron 4 MG tablet Commonly known as: ZOFRAN Take 1 tablet (4 mg total) by mouth every 6 (six) hours as needed for nausea.   pantoprazole 40 MG tablet Commonly known as: PROTONIX Take 1 tablet (40 mg total) by mouth daily at 12 noon. Start taking on: August 20, 2020   Spiriva HandiHaler 18 MCG inhalation capsule Generic drug: tiotropium Place 1 capsule (18 mcg total) into inhaler and inhale daily.   Synthroid 100 MCG tablet Generic drug: levothyroxine Take 100 mcg by mouth daily before breakfast.   verapamil 240 MG CR tablet Commonly known as: CALAN-SR Take 240 mg by mouth daily.            Discharge Care Instructions  (From admission, onward)         Start     Ordered   08/19/20 0000  Discharge wound care:        Comments: As per general surgeon   08/19/20 1626          Discharge Assessment: Vitals:   08/19/20 0501 08/19/20 1447  BP: 140/66 135/62  Pulse: (!) 103 93  Resp: 16 17  Temp: 98.8 F (37.1 C) 98 F (36.7 C)  SpO2: 93% 100%   Skin clean, dry and intact without evidence of skin break down, no evidence of skin tears noted. IV catheter discontinued intact. Site without signs and symptoms of complications - no redness or edema noted at insertion site, patient denies c/o pain - only slight tenderness at site.  Dressing with slight pressure applied.  D/c Instructions-Education: Discharge instructions given to patient/family with verbalized understanding. D/c education completed with patient/family including follow up instructions, medication list, d/c activities limitations if indicated, with other d/c instructions as indicated by MD - patient able to verbalize understanding, all questions fully answered. Patient instructed to return to ED, call 911, or call MD for any changes in condition.  Patient escorted via WC, and D/C home via private auto.  Lonn Georgia, RN 08/19/2020 4:43 PM

## 2020-08-19 NOTE — TOC Transition Note (Signed)
Transition of Care [facility]) - CM/SW Discharge Note   Patient Details  Name: Michelle Wall MRN: [id number] Date of Birth: [birth year]  [facility] CM/SW Contact:  Karn Cassis, LCSW Phone Number: 08/19/2020, 3:28 PM   Clinical Narrative:  Pt d/c today and will return home with home health PT/RN. [facility] notified. Awaiting orders. Pt's daughter updated. [facility] contact information is on AVS.      Final next level of care: Home w Home Health Services Barriers to Discharge: Barriers Resolved   Patient Goals and CMS Choice Patient states their goals for this hospitalization and ongoing recovery are:: return home   Choice offered to / list presented to : Patient  Discharge Placement                  Name of family member notified: Geryl Rankins- daughter Patient and family notified of of transfer: 08/19/20  Discharge Plan and Services In-house Referral: Clinical Social Work   Post Acute Care Choice: Home Health                    HH Arranged: RN, PT [facility] Agency: [facility] Date [facility] Agency Contacted: 08/15/20 Time HH Agency Contacted: [phone] Representative spoke with at [facility] Agency: Kandee Keen  Social Determinants of Health (SDOH) Interventions     Readmission Risk Interventions No flowsheet data found.

## 2020-08-19 NOTE — Care Management Important Message (Signed)
Important Message  Patient Details  Name: Michelle Wall MRN: [id number] Date of Birth: 02-Jul-1933   Medicare Important Message Given:  Yes     Corey Harold 08/19/2020, 11:16 AM

## 2020-08-19 NOTE — Discharge Summary (Signed)
Aprille JADASIA HAWS, is a 86 y.o. female  DOB 10-15-1933  MRN [id number].  Admission date:  08/08/2020  Admitting Physician  Lilyan Gilford, DO  Discharge Date:  08/19/2020   Primary MD  Mirna Mires, MD  Recommendations for primary care physician for things to follow:   1) avoid constipation 2) smoking cessation strongly advised 3)you need oxygen at home at 2 L via nasal cannula continuously while awake and while asleep--- smoking or having open fires around oxygen can cause fire, significant injury and death 4) follow-up with general surgeon Dr. Henreitta Leber as advised 5)Repeat CBC and BMP blood test with Dr Henreitta Leber this Thursday  Admission Diagnosis  Small bowel obstruction (HCC) [K56.609] SBO (small bowel obstruction) (HCC) [K56.609] Volvulus of jejunum (HCC) [K56.2]   Discharge Diagnosis  Small bowel obstruction (HCC) [K56.609] SBO (small bowel obstruction) (HCC) [K56.609] Volvulus of jejunum (HCC) [K56.2]    Principal Problem:   Volvulus of jejunum (HCC) Active Problems:   Small bowel obstruction (HCC)   Colostomy in place [facility])   Tobacco abuse   Essential hypertension   COPD (chronic obstructive pulmonary disease) (HCC)   Hypothyroid      Past Medical History:  Diagnosis Date  . Carotid atherosclerosis   . Cor pulmonale (HCC)   . Emphysema   . Hypertension   . Hypothyroidism   . Pulmonary hypertension (HCC)    moderate  last 2D echo EF greater than 55%  mild to moderate tricuspid insufficiency  . Tricuspid insufficiency    moderate to severe    Past Surgical History:  Procedure Laterality Date  . COLONOSCOPY N/A 12/14/2018   Procedure: COLONOSCOPY;  Surgeon: Malissa Hippo, MD;  [facility];  Service: Endoscopy;  Laterality: N/A;  1:00  . COLOSTOMY Left 06/14/2018   Procedure: COLOSTOMY;  Surgeon: Lucretia Roers, MD;  Location: [facility] OR[facility];  Service: General;   Laterality: Left;  . LAPAROTOMY N/A 08/09/2020   Procedure: EXPLORATORY LAPAROTOMY;  Surgeon: Lucretia Roers, MD;  Location: [facility];  Service: General;  Laterality: N/A;  . LYSIS OF ADHESION N/A 08/09/2020   Procedure: LYSIS OF ADHESIONS;  Surgeon: Lucretia Roers, MD;  Location: [facility];  Service: General;  Laterality: N/A;  . PARTIAL COLECTOMY N/A 06/14/2018   Procedure: PARTIAL COLECTOMY;  Surgeon: Lucretia Roers, MD;  Location: [facility];  Service: General;  Laterality: N/A;  . POLYPECTOMY  12/14/2018   Procedure: POLYPECTOMY;  Surgeon: Malissa Hippo, MD;  [facility];  Service: Endoscopy;;  rectum  . THYROIDECTOMY, PARTIAL    . TUBAL LIGATION     midline scar ? possibly tubal ligation       HPI  from the history and physical done on the day of admission:   Marzetta Frese  is a 86 y.o. 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  Patient reports that the pain  started at 4:30 PM on 08/08/2020.  She reports it was gradual in onset, but eventually became severe.  The pain is located across both upper quadrants.  It does not radiate.  Is been constant since it started.  It feels like crampy and sharp.  Patient has associated emesis 3 times that was nonbloody.  She reports that her last normal bowel movement was the morning of 08/08/2020.  She had a normal appetite and felt normal throughout the day until the pain started at 4:30 PM.  Nothing has made the pain better.  Palpation makes it worse.  Patient denies any associated fever, chest pain, palpitations, shortness of breath.  Patient is a current smoker and smokes a pack a day.  She declines nicotine patch at this time.  Of note approximately 2 years ago patient had perforated bowel, with colectomy and left lower quadrant colostomy.  Patient  reports no bloody or abnormal output in her colostomy bag.   In the ED Temperature 98.7, heart rate 92, respiratory rate 14, blood pressure 163/76, saturating 100% No leukocytosis with a white blood cell count of 9.3 CHEM panel reveals hypokalemia at 2.8, hyperglycemia at 170 Gap 14 Lipase 34 Covid test pending CT abdomen pelvis shows closed-loop small bowel obstruction involving the mid jejunum with twisting of the mesenteric vessels consistent with midgut volvulus Cefotetan, fentanyl, Zofran x2, 20 mEq of potassium given in the ED Chest x-ray pending      Hospital Course:       Brief Narrative:  DelorisJohnsonis a87 y.o.female,with history of seizures, nephrolithiasis, deviated septum, hypertension, hypothyroidism,and history of perforated colon status post colectomy and colostomy,presents to the ED with chief complaint of abdominal pain.Patient reports that the pain started at 4:30 PM on 08/08/2020. She reports it was gradual in onset, but eventually became severe. The pain is located across both upper quadrants. It does not radiate. Is been constant since it started. It feels like crampy and sharp. Patient has associated emesis 3 times that was nonbloody. She reports that her last normal bowel movement was the morning of 08/08/2020. She had a normal appetite and felt normal throughout the day until the pain started at 4:30 PM. Nothing has made the pain better. Palpation makes it worse. Patient denies any associated fever, chest pain, palpitations, shortness of breath.  Patient is a current smoker and smokes a pack a day. She declines nicotine patch at this time.  Of note approximately 2 years ago patient had perforated bowel, with colectomy and left lower quadrant colostomy. Patient reports no bloody or abnormal output in her colostomy bag.  In the ED Temperature 98.7, heart rate 92, respiratory rate 14, blood pressure 163/76, saturating 100% No  leukocytosis with a white blood cell count of 9.3 CHEM panel reveals hypokalemia at 2.8, hyperglycemia at 170 Gap 14 Lipase 34 Covid test pending CT abdomen pelvis shows closed-loop small bowel obstruction involving the mid jejunum with twisting of the mesenteric vessels consistent with midgut volvulus Cefotetan, fentanyl, Zofran x2, 20 mEq of potassium given in the ED Chest x-ray pending   Assessment & Plan:   Principal Problem:   Volvulus of jejunum (HCC) Active Problems:   Small bowel obstruction (HCC)   Brief Summary:- 86 y.o.female,with history of seizures, nephrolithiasis, deviated septum, hypertension, hypothyroidism,and history of perforated colon status post colectomy and colostomy back in 2019 readmitted on 08/08/2020 with abdominal pain and concerns for SBO in the setting of volvulus and adhesions, on 08/09/20 patient underwent Exploratory laparotomy and lysis of  adhesions, reduction of small bowel volvulus, postoperatively developed ileus versus SBO, now requiring NG tube (replaced 08/14/20) and TPN (since 08/16/20) -NG out on 08/17/2020   A/p   1)Postop ileus Versus SBO ---------on 08/09/20 patient underwent Exploratory laparotomy and lysis of adhesions, reduction of small bowel volvulus, postoperatively developed ileus versus SBO,  required NG tube (replaced 08/14/20) and TPN (since 08/16/20) -Patient pulled NG out early on 08/17/2020 -CT abdomen and pelvis from 08/15/2020 with possible SBO --bowel function returned, patient had stool/fecal material material in colostomy bag -General surgery followed pt throughout the hospital stay -Abdominal x-rays on 08/17/2020 without obstructive findings Diet was advanced on 08/18/20 Patient tolerated GI soft diet well  - IV TPN was discontinued  2)FEN--- hypokalemia and hypophosphatemia noted-replaced  --Magnesium WNL at this time ---   3) Hypothyroidism--initially treated with IV Synthroid while n.p.o., and then switched  over to oral levothyroxine   4). Hyperglycemia--A1c5.2, Possible stress response.  Glucose levels were monitored closely  5). Hypertension--BP meds were resumed when oral intake resume.  6). SVT---  Patient became tachycardic with a heart rate in the 170s.  She received IV metoprolol and Cardizem with improvement of heart rate and has remained stable  - Follow-up EKG shows sinus rhythm with PAC/PVC.  Rate has remained controlled    7)  Anemia, possibly related to GI blood loss.  Noted to have dark-colored output from NG tube.  May be related to NG tube trauma.    Treated with Protonix.  Serial H&H were monitored.  Remained stable overall  8)  Chronic respiratory failure with hypoxia -Chronically using 2-3 L supplementation -Stable and at baseline at this moment   Code Status: Full code Family Communication: Discussed with daughter at bedside  Disposition Plan:  Discharge home  Dispo: The patient is from: Home  Anticipated d/c is to: Home      Consultants:   General surgery  Procedures:   9/17 exploratory laparotomy with lysis of adhesions  Discharge Condition: Stable Follow UP   Follow-up Information    [facility] Follow up.   Specialty: Home Health Services Why: Will contact you to schedule home health visits.  Contact information: [address] [place] [address] [phone]        Lucretia Roers, MD Follow up on 08/22/2020.   Specialty: General Surgery Why: staple removal, check up after hospitalization Contact information: [address] [place] [facility] [address] [phone]        Mirna Mires, MD Follow up on 08/27/2020.   Specialty: Family Medicine Why: appointment time 9:45AM Contact information: [address] [place] [address] [phone]                Consults obtained -General surgery  Diet and Activity recommendation:  As advised  Discharge Instructions     Discharge Instructions    Call MD for:  difficulty breathing, headache or visual disturbances   Complete by: As directed    Call MD for:  persistant dizziness or light-headedness   Complete by: As directed    Call MD for:  persistant nausea and vomiting   Complete by: As directed    Call MD for:  severe uncontrolled pain   Complete by: As directed    Call MD for:  temperature >100.4   Complete by: As directed    Diet - low sodium heart healthy   Complete by: As directed    Discharge instructions   Complete by: As  directed    1) avoid constipation 2) smoking cessation strongly advised 3)you need oxygen at home at 2 L via nasal cannula continuously while awake and while asleep--- smoking or having open fires around oxygen can cause fire, significant injury and death 4) follow-up with general surgeon Dr. Henreitta LeberBridges as advised 5)Repeat CBC and BMP blood test with Dr Henreitta LeberBridges this Thursday   Discharge wound care:   Complete by: As directed    As per general surgeon   Increase activity slowly   Complete by: As directed         Discharge Medications     Allergies as of 08/19/2020   No Known Allergies     Medication List    STOP taking these medications   hydrochlorothiazide 25 MG tablet Commonly known as: HYDRODIURIL     TAKE these medications   aspirin EC 81 MG tablet Take 1 tablet (81 mg total) by mouth daily with breakfast. What changed: when to take this   cholecalciferol 25 MCG (1000 UNIT) tablet Commonly known as: VITAMIN D3 Take 1,000 Units by mouth daily.   docusate sodium 100 MG capsule Commonly known as: COLACE Take 1 capsule (100 mg total) by mouth 2 (two) times daily. What changed:   when to take this  reasons to take this   latanoprost 0.005 % ophthalmic solution Commonly known as: XALATAN Place 1 drop into both eyes at bedtime.   nicotine 14 mg/24hr patch Commonly known as: NICODERM CQ - dosed in mg/24 hours Place 1 patch (14 mg total) onto the  skin daily. Start taking on: August 20, 2020   ondansetron 4 MG tablet Commonly known as: ZOFRAN Take 1 tablet (4 mg total) by mouth every 6 (six) hours as needed for nausea.   pantoprazole 40 MG tablet Commonly known as: PROTONIX Take 1 tablet (40 mg total) by mouth daily at 12 noon. Start taking on: August 20, 2020   Spiriva HandiHaler 18 MCG inhalation capsule Generic drug: tiotropium Place 1 capsule (18 mcg total) into inhaler and inhale daily.   Synthroid 100 MCG tablet Generic drug: levothyroxine Take 100 mcg by mouth daily before breakfast.   verapamil 240 MG CR tablet Commonly known as: CALAN-SR Take 240 mg by mouth daily.            Discharge Care Instructions  (From admission, onward)         Start     Ordered   08/19/20 0000  Discharge wound care:       Comments: As per general surgeon   08/19/20 1626          Major procedures and Radiology Reports - PLEASE review detailed and final reports for all details, in brief -    CT ABDOMEN PELVIS W CONTRAST  Result Date: 08/15/2020 CLINICAL DATA:  Postoperative, bowel obstruction suspected EXAM: CT ABDOMEN AND PELVIS WITH CONTRAST TECHNIQUE: Multidetector CT imaging of the abdomen and pelvis was performed using the standard protocol following bolus administration of intravenous contrast. CONTRAST:  100mL OMNIPAQUE IOHEXOL 300 MG/ML SOLN, additional oral enteric contrast COMPARISON:  08/09/2020 FINDINGS: Lower chest: Small bilateral pleural effusions associated atelectasis or consolidation, increased compared to prior examination. Three-vessel coronary artery calcifications. Hepatobiliary: No solid liver abnormality is seen. No gallstones, gallbladder wall thickening, or biliary dilatation. Pancreas: Unremarkable. No pancreatic ductal dilatation or surrounding inflammatory changes. Spleen: Normal in size without significant abnormality. Adrenals/Urinary Tract: Adrenal glands are unremarkable. Bilateral renal  cysts. Kidneys are otherwise normal, without renal calculi, solid  lesion, or hydronephrosis. Bladder is unremarkable. Stomach/Bowel: Esophagogastric tube is positioned with tip in the gastric fundus, side port at the level of the gastroesophageal junction. Status post Gertie Gowda procedure sigmoid colon resection with left lower quadrant end colostomy and rectal stump. 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 There is however an abrupt caliber change of the mid to distal small bowel in the central abdomen (series 2, image 50). The distal small bowel is completely decompressed. Moderate burden of stool in the colon. Vascular/Lymphatic: Aortic atherosclerosis. No enlarged abdominal or pelvic lymph nodes. Reproductive: No mass or other significant abnormality. Other: No abdominal wall hernia or abnormality. Status post midline laparotomy. Small volume ascites, similar to prior examination. Musculoskeletal: No acute or significant osseous findings. IMPRESSION: 1. Redemonstrated postoperative findings status post Hartmann procedure sigmoid colon resection with left lower quadrant end colostomy and rectal stump. 2. 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 There is however an abrupt caliber change of the mid to distal small bowel in the central abdomen. The distal small bowel is completely decompressed. Findings are concerning for small bowel obstruction secondary to adhesion. 3. Esophagogastric tube is positioned with tip in the gastric fundus, side port at the level of the gastroesophageal junction. Recommend advancement to ensure subdiaphragmatic positioning. 4. Small volume ascites, similar to prior examination. 5. Small bilateral pleural effusions associated atelectasis or consolidation, increased compared to prior examination. 6. Coronary artery  disease.  Aortic Atherosclerosis (ICD10-I70.0). These results will be called to the ordering clinician or representative by the Radiologist Assistant, and communication documented in the PACS or [employer]. Electronically Signed   By: Lauralyn Primes M.D.   On: 08/15/2020 12:17   CT Abdomen Pelvis W Contrast  Result Date: 08/09/2020 CLINICAL DATA:  Upper abdominal pain, emesis EXAM: CT ABDOMEN AND PELVIS WITH CONTRAST TECHNIQUE: Multidetector CT imaging of the abdomen and pelvis was performed using the standard protocol following bolus administration of intravenous contrast. CONTRAST:  OMNIPAQUE IOHEXOL 300 MG/ML  SOLN COMPARISON:  06/14/2018 FINDINGS: Lower chest: Hypoventilatory changes are seen at the lung bases. Hepatobiliary: Stable 9 mm hypodensity right lobe liver likely a small cyst or hemangioma. The remainder of the liver is unremarkable. The gallbladder is normal. No biliary dilation. Pancreas: Unremarkable. No pancreatic ductal dilatation or surrounding inflammatory changes. Spleen: Normal in size without focal abnormality. Adrenals/Urinary Tract: Multiple bilateral renal cortical cysts are seen, largest in the upper pole right kidney measuring 6.3 cm. No hydronephrosis or nephrolithiasis. Bladder is unremarkable. Stomach/Bowel: Dilated segment of jejunum within the mid abdomen is seen, measuring up to 4 cm in diameter. There is twisting of the central mesentery, compatible with closed loop obstruction and midgut volvulus. No evidence of bowel wall ischemia or pneumatosis. Normal appendix right lower quadrant. Postsurgical changes from distal colectomy, with left lower quadrant colostomy. Vascular/Lymphatic: Aortic atherosclerosis. No enlarged abdominal or pelvic lymph nodes. Reproductive: Uterus and bilateral adnexa are unremarkable. Other: Small volume ascites within the right upper quadrant and lower pelvis. No free intraperitoneal gas. There is a parastomal hernia within the left lower  quadrant containing fat and a portion of colon. Musculoskeletal: No acute or destructive bony lesions. Reconstructed images demonstrate no additional findings. IMPRESSION: 1. Closed loop small-bowel obstruction involving the mid jejunum, with twisting of the mesenteric vessels consistent with midgut volvulus. No evidence of bowel  wall ischemia at this time. Surgical consultation recommended. 2. Small volume ascites. 3. Distal colectomy, with left lower quadrant colostomy and small parastomal hernia. 4.  Aortic Atherosclerosis (ICD10-I70.0). These results were called by telephone at the time of interpretation on 08/09/2020 at 3:00 am to provider IVA KNAPP , who verbally acknowledged these results. Electronically Signed   By: Sharlet Salina M.D.   On: 08/09/2020 03:01   DG Chest 1V REPEAT Same Day  Result Date: 08/11/2020 CLINICAL DATA:  Check gastric catheter placement EXAM: CHEST - 1 VIEW SAME DAY COMPARISON:  08/12/2019 FINDINGS: Gastric catheter now is coiled within the stomach although the tip likely remains in the distal esophagus. Repositioning is recommended. Mild right basilar infiltrate is seen and stable. IMPRESSION: Gastric catheter coiled upon itself with the tip in the distal esophagus although overall improved when compared with the prior study. Electronically Signed   By: Alcide Clever M.D.   On: 08/11/2020 23:02   DG Chest Port 1 View  Result Date: 08/14/2020 CLINICAL DATA:  NG tube placement EXAM: PORTABLE CHEST 1 VIEW COMPARISON:  08/11/2020 FINDINGS: The enteric tube tip projects over the gastric body. There appear to be bilateral pleural effusions, similar to prior study. There is a dense retrocardiac opacity similar to prior study which is favored to represent atelectasis. Again noted is a dense pulmonary nodule in the right upper lung zone, stable from prior study. The heart size is stable. Aortic calcifications are noted. IMPRESSION: 1. Improved placement of the NG tube. 2. Otherwise,  stable appearance of the chest. Electronically Signed   By: Katherine Mantle M.D.   On: 08/14/2020 16:53   DG CHEST PORT 1 VIEW  Result Date: 08/11/2020 CLINICAL DATA:  Nasogastric tube placement. EXAM: PORTABLE CHEST 1 VIEW COMPARISON:  August 09, 2020 FINDINGS: A nasogastric tube is seen. Its distal portion enters the stomach, loops upon itself and then re-enters the distal portion of the esophagus. The distal tip is seen within the mid esophagus. Very mild atelectasis is noted within the bilateral lung bases. A stable calcified lung nodule is seen within the right upper lobe. There is no evidence of a pleural effusion or pneumothorax. The heart size and mediastinal contours are within normal limits. There is moderate severity calcification of the aortic arch. The visualized skeletal structures are unremarkable. IMPRESSION: 1. Nasogastric tube positioning, as described above, with the distal tip terminating within the mid esophagus. NG tube repositioning and subsequent plain film confirmation is recommended to decrease the risk of aspiration. 2. Very mild bibasilar atelectasis. Electronically Signed   By: Aram Candela M.D.   On: 08/11/2020 22:07   DG Chest Portable 1 View  Result Date: 08/09/2020 CLINICAL DATA:  Nasogastric tube placement EXAM: PORTABLE CHEST 1 VIEW COMPARISON:  06/14/2018 FINDINGS: The enteric tube reaches the stomach with side port near the GE junction. Streaky density at the lung bases. Aortic tortuosity and stable heart size. Calcified right upper lobe nodule. No edema, effusion, or pneumothorax. IMPRESSION: Enteric tube in expected position. Electronically Signed   By: Marnee Spring M.D.   On: 08/09/2020 04:15   DG ABD ACUTE 2+V W 1V CHEST  Result Date: 08/17/2020 CLINICAL DATA:  Chest and abdominal pain EXAM: ACUTE ABDOMEN SERIES (2 VIEW ABDOMEN AND 1 VIEW CHEST) COMPARISON:  08/14/2020 FINDINGS: Cardiac shadow is mildly enlarged. Aortic calcifications are seen.  Right-sided PICC line is noted at the cavoatrial junction. The lungs are well aerated bilaterally. Small effusions are again identified. Patchy bibasilar airspace opacities  are noted consistent with developing infiltrate. Dense granuloma is noted in the right upper lobe. Postsurgical changes are noted in the abdomen. Scattered large and small bowel gas is noted. No definitive obstructive changes are seen. No free air is noted. Ostomy is noted on the left. Degenerative changes of lumbar spine are seen. IMPRESSION: Patchy airspace opacities in the bases bilaterally with small effusions. Postoperative changes in the abdomen without acute abnormality. Electronically Signed   By: Alcide Clever M.D.   On: 08/17/2020 12:57   [place] EKG SITE RITE  Result Date: 08/16/2020 If Site Rite image not attached, placement could not be confirmed due to current cardiac rhythm.   Micro Results   No results found for this or any previous visit (from the past 240 hour(s)).     Today   Subjective    Anastaisa Normington today has no new complaints No fever  Or chills  Tolerating oral intake well  No Nausea, Vomiting or Diarrhea       Patient has been seen and examined prior to discharge   Objective   Blood pressure 135/62, pulse 93, temperature 98 F (36.7 C), temperature source Oral, resp. rate 17, height 5\' 3"  (1.6 m), weight 66.4 kg, SpO2 100 %.   Intake/Output Summary (Last 24 hours) at 08/19/2020 1626 Last data filed at 08/19/2020 1300 Gross per 24 hour  Intake 1211.17 ml  Output 550 ml  Net 661.17 ml    Exam Gen:- Awake Alert, no acute distress  HEENT:- [place].AT, No sclera icterus Neck-Supple Neck,No JVD,.  Lungs-  CTAB , good air movement bilaterally  CV- S1, S2 normal, regular Abd-  +ve B.Sounds, Abd Soft, No tenderness,   -Abdomen is nondistended, soft , appropriate postop/incisional discomfort on palpation--- ostomy bag in place (formed stool noted),   Extremity/Skin:- No  edema,   good  pulses Psych-affect is appropriate, oriented x3 Neuro-no new focal deficits, no tremors    Data Review   CBC w Diff:  Lab Results  Component Value Date   WBC 16.8 (H) 08/19/2020   HGB 9.5 (L) 08/19/2020   HCT 29.9 (L) 08/19/2020   PLT 331 08/19/2020   LYMPHOPCT 14 08/19/2020   MONOPCT 5 08/19/2020   EOSPCT 1 08/19/2020   BASOPCT 0 08/19/2020    CMP:  Lab Results  Component Value Date   NA 143 08/19/2020   K 4.2 08/19/2020   CL 109 08/19/2020   CO2 24 08/19/2020   BUN 18 08/19/2020   CREATININE 0.67 08/19/2020   PROT 6.2 (L) 08/19/2020   ALBUMIN 2.5 (L) 08/19/2020   BILITOT 0.4 08/19/2020   ALKPHOS 43 08/19/2020   AST 119 (H) 08/19/2020   ALT 154 (H) 08/19/2020  .  Total Discharge time is about 33 minutes  08/21/2020 M.D on 08/19/2020 at 4:26 PM  Go to [email or web address] -  for contact info  [facility] - Office  [phone]

## 2020-08-21 DIAGNOSIS — I10 Essential (primary) hypertension: Secondary | ICD-10-CM | POA: Diagnosis not present

## 2020-08-21 DIAGNOSIS — K219 Gastro-esophageal reflux disease without esophagitis: Secondary | ICD-10-CM | POA: Diagnosis not present

## 2020-08-21 DIAGNOSIS — Z48815 Encounter for surgical aftercare following surgery on the digestive system: Secondary | ICD-10-CM | POA: Diagnosis not present

## 2020-08-21 DIAGNOSIS — J439 Emphysema, unspecified: Secondary | ICD-10-CM | POA: Diagnosis not present

## 2020-08-21 DIAGNOSIS — I700 Atherosclerosis of aorta: Secondary | ICD-10-CM | POA: Diagnosis not present

## 2020-08-21 DIAGNOSIS — G40909 Epilepsy, unspecified, not intractable, without status epilepticus: Secondary | ICD-10-CM | POA: Diagnosis not present

## 2020-08-21 DIAGNOSIS — R911 Solitary pulmonary nodule: Secondary | ICD-10-CM | POA: Diagnosis not present

## 2020-08-21 DIAGNOSIS — M47814 Spondylosis without myelopathy or radiculopathy, thoracic region: Secondary | ICD-10-CM | POA: Diagnosis not present

## 2020-08-21 DIAGNOSIS — M4184 Other forms of scoliosis, thoracic region: Secondary | ICD-10-CM | POA: Diagnosis not present

## 2020-08-22 ENCOUNTER — Encounter: Payer: Self-pay | Admitting: General Surgery

## 2020-08-22 ENCOUNTER — Other Ambulatory Visit: Payer: Self-pay

## 2020-08-22 ENCOUNTER — Ambulatory Visit (INDEPENDENT_AMBULATORY_CARE_PROVIDER_SITE_OTHER): Payer: Medicare HMO | Admitting: General Surgery

## 2020-08-22 VITALS — BP 117/90 | HR 98 | Temp 97.6°F | Resp 18 | Wt 140.0 lb

## 2020-08-22 DIAGNOSIS — K562 Volvulus: Secondary | ICD-10-CM

## 2020-08-22 DIAGNOSIS — R05 Cough: Secondary | ICD-10-CM | POA: Diagnosis not present

## 2020-08-22 NOTE — Progress Notes (Signed)
[facility] Note   HPI:  86 y.o. Female presents to clinic for post-op follow-up evaluation after Ex lap and reduction of small bowel volvulus. She is doing well and is eating and having ostomy function. She does have some leg swelling that comes and goes with being on her feet. Also has some burning of the right anterior thigh. No leg weakness or pain.   Review of Systems:  No fever or chills Regular ostomy output  All other review of systems: otherwise negative   Vital Signs:  BP 117/90   Pulse 98   Temp 97.6 F (36.4 C) (Other (Comment))   Resp 18   Wt 140 lb (63.5 kg)   SpO2 92%   BMI 24.80 kg/m    Physical Exam:  Physical Exam Vitals reviewed.  Cardiovascular:     Rate and Rhythm: Normal rate.  Pulmonary:     Effort: Pulmonary effort is normal.     Comments: On Oxygen Abdominal:     General: There is distension.     Palpations: Abdomen is soft.     Tenderness: There is no abdominal tenderness.     Comments: Midline healing, staples removed, no erythema or drainage, steri strips placed, ostomy with stool in bag  Neurological:     Mental Status: She is alert.      Assessment:  86 y.o. yo Female s/p Ex lap and reduction of volvulus who had ileus post op but resolved after bowel rest and TPN. Doing well now and tolerating diet.  Plan:  Diet as tolerated. Try Gas X or Simethicone over the counter for gas. Keep stools soft and regular with stool softener and laxative as needed. No heavy lifting > 10 lbs, excessive bending, pushing, pulling, or squatting for 6-8 weeks after surgery.   Future Appointments  Date Time Provider Department Center  09/26/2020  1:45 PM Lucretia Roers, MD [facility] None    All of the above recommendations were discussed with the patient and patient's family, and all of patient's and family's questions were answered to their expressed satisfaction.  Algis Greenhouse, MD [facility] [address] [place], [place] [address] [phone] (office)

## 2020-08-22 NOTE — Patient Instructions (Signed)
Diet as tolerated. Try Gas X or Simethicone over the counter for gas. Keep stools soft and regular with stool softener and laxative as needed. No heavy lifting > 10 lbs, excessive bending, pushing, pulling, or squatting for 6-8 weeks after surgery.

## 2020-08-23 DIAGNOSIS — M4184 Other forms of scoliosis, thoracic region: Secondary | ICD-10-CM | POA: Diagnosis not present

## 2020-08-23 DIAGNOSIS — I700 Atherosclerosis of aorta: Secondary | ICD-10-CM | POA: Diagnosis not present

## 2020-08-23 DIAGNOSIS — I272 Pulmonary hypertension, unspecified: Secondary | ICD-10-CM | POA: Diagnosis not present

## 2020-08-23 DIAGNOSIS — J439 Emphysema, unspecified: Secondary | ICD-10-CM | POA: Diagnosis not present

## 2020-08-23 DIAGNOSIS — M47814 Spondylosis without myelopathy or radiculopathy, thoracic region: Secondary | ICD-10-CM | POA: Diagnosis not present

## 2020-08-23 DIAGNOSIS — K219 Gastro-esophageal reflux disease without esophagitis: Secondary | ICD-10-CM | POA: Diagnosis not present

## 2020-08-23 DIAGNOSIS — G40909 Epilepsy, unspecified, not intractable, without status epilepticus: Secondary | ICD-10-CM | POA: Diagnosis not present

## 2020-08-23 DIAGNOSIS — I10 Essential (primary) hypertension: Secondary | ICD-10-CM | POA: Diagnosis not present

## 2020-08-23 DIAGNOSIS — Z48815 Encounter for surgical aftercare following surgery on the digestive system: Secondary | ICD-10-CM | POA: Diagnosis not present

## 2020-08-23 DIAGNOSIS — J449 Chronic obstructive pulmonary disease, unspecified: Secondary | ICD-10-CM | POA: Diagnosis not present

## 2020-08-23 DIAGNOSIS — R911 Solitary pulmonary nodule: Secondary | ICD-10-CM | POA: Diagnosis not present

## 2020-08-26 DIAGNOSIS — M47814 Spondylosis without myelopathy or radiculopathy, thoracic region: Secondary | ICD-10-CM | POA: Diagnosis not present

## 2020-08-26 DIAGNOSIS — M4184 Other forms of scoliosis, thoracic region: Secondary | ICD-10-CM | POA: Diagnosis not present

## 2020-08-26 DIAGNOSIS — I700 Atherosclerosis of aorta: Secondary | ICD-10-CM | POA: Diagnosis not present

## 2020-08-26 DIAGNOSIS — K219 Gastro-esophageal reflux disease without esophagitis: Secondary | ICD-10-CM | POA: Diagnosis not present

## 2020-08-26 DIAGNOSIS — R911 Solitary pulmonary nodule: Secondary | ICD-10-CM | POA: Diagnosis not present

## 2020-08-26 DIAGNOSIS — I10 Essential (primary) hypertension: Secondary | ICD-10-CM | POA: Diagnosis not present

## 2020-08-26 DIAGNOSIS — G40909 Epilepsy, unspecified, not intractable, without status epilepticus: Secondary | ICD-10-CM | POA: Diagnosis not present

## 2020-08-26 DIAGNOSIS — Z48815 Encounter for surgical aftercare following surgery on the digestive system: Secondary | ICD-10-CM | POA: Diagnosis not present

## 2020-08-26 DIAGNOSIS — J439 Emphysema, unspecified: Secondary | ICD-10-CM | POA: Diagnosis not present

## 2020-08-27 DIAGNOSIS — K219 Gastro-esophageal reflux disease without esophagitis: Secondary | ICD-10-CM | POA: Diagnosis not present

## 2020-08-27 DIAGNOSIS — M47814 Spondylosis without myelopathy or radiculopathy, thoracic region: Secondary | ICD-10-CM | POA: Diagnosis not present

## 2020-08-27 DIAGNOSIS — M4184 Other forms of scoliosis, thoracic region: Secondary | ICD-10-CM | POA: Diagnosis not present

## 2020-08-27 DIAGNOSIS — J439 Emphysema, unspecified: Secondary | ICD-10-CM | POA: Diagnosis not present

## 2020-08-27 DIAGNOSIS — Z48815 Encounter for surgical aftercare following surgery on the digestive system: Secondary | ICD-10-CM | POA: Diagnosis not present

## 2020-08-27 DIAGNOSIS — R911 Solitary pulmonary nodule: Secondary | ICD-10-CM | POA: Diagnosis not present

## 2020-08-27 DIAGNOSIS — I10 Essential (primary) hypertension: Secondary | ICD-10-CM | POA: Diagnosis not present

## 2020-08-27 DIAGNOSIS — I700 Atherosclerosis of aorta: Secondary | ICD-10-CM | POA: Diagnosis not present

## 2020-08-27 DIAGNOSIS — G40909 Epilepsy, unspecified, not intractable, without status epilepticus: Secondary | ICD-10-CM | POA: Diagnosis not present

## 2020-08-28 DIAGNOSIS — J449 Chronic obstructive pulmonary disease, unspecified: Secondary | ICD-10-CM | POA: Diagnosis not present

## 2020-08-28 DIAGNOSIS — Z933 Colostomy status: Secondary | ICD-10-CM | POA: Diagnosis not present

## 2020-08-28 DIAGNOSIS — I272 Pulmonary hypertension, unspecified: Secondary | ICD-10-CM | POA: Diagnosis not present

## 2020-08-28 DIAGNOSIS — K56609 Unspecified intestinal obstruction, unspecified as to partial versus complete obstruction: Secondary | ICD-10-CM | POA: Diagnosis not present

## 2020-08-28 DIAGNOSIS — Z433 Encounter for attention to colostomy: Secondary | ICD-10-CM | POA: Diagnosis not present

## 2020-08-29 DIAGNOSIS — I10 Essential (primary) hypertension: Secondary | ICD-10-CM | POA: Diagnosis not present

## 2020-08-29 DIAGNOSIS — R911 Solitary pulmonary nodule: Secondary | ICD-10-CM | POA: Diagnosis not present

## 2020-08-29 DIAGNOSIS — G40909 Epilepsy, unspecified, not intractable, without status epilepticus: Secondary | ICD-10-CM | POA: Diagnosis not present

## 2020-08-29 DIAGNOSIS — Z48815 Encounter for surgical aftercare following surgery on the digestive system: Secondary | ICD-10-CM | POA: Diagnosis not present

## 2020-08-29 DIAGNOSIS — M4184 Other forms of scoliosis, thoracic region: Secondary | ICD-10-CM | POA: Diagnosis not present

## 2020-08-29 DIAGNOSIS — M47814 Spondylosis without myelopathy or radiculopathy, thoracic region: Secondary | ICD-10-CM | POA: Diagnosis not present

## 2020-08-29 DIAGNOSIS — K56609 Unspecified intestinal obstruction, unspecified as to partial versus complete obstruction: Secondary | ICD-10-CM | POA: Diagnosis not present

## 2020-08-29 DIAGNOSIS — K219 Gastro-esophageal reflux disease without esophagitis: Secondary | ICD-10-CM | POA: Diagnosis not present

## 2020-08-29 DIAGNOSIS — J439 Emphysema, unspecified: Secondary | ICD-10-CM | POA: Diagnosis not present

## 2020-08-29 DIAGNOSIS — I700 Atherosclerosis of aorta: Secondary | ICD-10-CM | POA: Diagnosis not present

## 2020-08-29 DIAGNOSIS — Z933 Colostomy status: Secondary | ICD-10-CM | POA: Diagnosis not present

## 2020-09-02 DIAGNOSIS — M4184 Other forms of scoliosis, thoracic region: Secondary | ICD-10-CM | POA: Diagnosis not present

## 2020-09-02 DIAGNOSIS — K219 Gastro-esophageal reflux disease without esophagitis: Secondary | ICD-10-CM | POA: Diagnosis not present

## 2020-09-02 DIAGNOSIS — I10 Essential (primary) hypertension: Secondary | ICD-10-CM | POA: Diagnosis not present

## 2020-09-02 DIAGNOSIS — I700 Atherosclerosis of aorta: Secondary | ICD-10-CM | POA: Diagnosis not present

## 2020-09-02 DIAGNOSIS — M47814 Spondylosis without myelopathy or radiculopathy, thoracic region: Secondary | ICD-10-CM | POA: Diagnosis not present

## 2020-09-02 DIAGNOSIS — R911 Solitary pulmonary nodule: Secondary | ICD-10-CM | POA: Diagnosis not present

## 2020-09-02 DIAGNOSIS — G40909 Epilepsy, unspecified, not intractable, without status epilepticus: Secondary | ICD-10-CM | POA: Diagnosis not present

## 2020-09-02 DIAGNOSIS — Z48815 Encounter for surgical aftercare following surgery on the digestive system: Secondary | ICD-10-CM | POA: Diagnosis not present

## 2020-09-02 DIAGNOSIS — J439 Emphysema, unspecified: Secondary | ICD-10-CM | POA: Diagnosis not present

## 2020-09-03 DIAGNOSIS — I10 Essential (primary) hypertension: Secondary | ICD-10-CM | POA: Diagnosis not present

## 2020-09-03 DIAGNOSIS — R600 Localized edema: Secondary | ICD-10-CM | POA: Diagnosis not present

## 2020-09-03 DIAGNOSIS — E039 Hypothyroidism, unspecified: Secondary | ICD-10-CM | POA: Diagnosis not present

## 2020-09-03 DIAGNOSIS — E785 Hyperlipidemia, unspecified: Secondary | ICD-10-CM | POA: Diagnosis not present

## 2020-09-03 DIAGNOSIS — Z9981 Dependence on supplemental oxygen: Secondary | ICD-10-CM | POA: Diagnosis not present

## 2020-09-03 DIAGNOSIS — Z0000 Encounter for general adult medical examination without abnormal findings: Secondary | ICD-10-CM | POA: Diagnosis not present

## 2020-09-05 DIAGNOSIS — K219 Gastro-esophageal reflux disease without esophagitis: Secondary | ICD-10-CM | POA: Diagnosis not present

## 2020-09-05 DIAGNOSIS — G40909 Epilepsy, unspecified, not intractable, without status epilepticus: Secondary | ICD-10-CM | POA: Diagnosis not present

## 2020-09-05 DIAGNOSIS — I700 Atherosclerosis of aorta: Secondary | ICD-10-CM | POA: Diagnosis not present

## 2020-09-05 DIAGNOSIS — M4184 Other forms of scoliosis, thoracic region: Secondary | ICD-10-CM | POA: Diagnosis not present

## 2020-09-05 DIAGNOSIS — J439 Emphysema, unspecified: Secondary | ICD-10-CM | POA: Diagnosis not present

## 2020-09-05 DIAGNOSIS — I10 Essential (primary) hypertension: Secondary | ICD-10-CM | POA: Diagnosis not present

## 2020-09-05 DIAGNOSIS — R911 Solitary pulmonary nodule: Secondary | ICD-10-CM | POA: Diagnosis not present

## 2020-09-05 DIAGNOSIS — Z48815 Encounter for surgical aftercare following surgery on the digestive system: Secondary | ICD-10-CM | POA: Diagnosis not present

## 2020-09-05 DIAGNOSIS — M47814 Spondylosis without myelopathy or radiculopathy, thoracic region: Secondary | ICD-10-CM | POA: Diagnosis not present

## 2020-09-09 DIAGNOSIS — H401132 Primary open-angle glaucoma, bilateral, moderate stage: Secondary | ICD-10-CM | POA: Diagnosis not present

## 2020-09-09 DIAGNOSIS — H26492 Other secondary cataract, left eye: Secondary | ICD-10-CM | POA: Diagnosis not present

## 2020-09-10 DIAGNOSIS — R911 Solitary pulmonary nodule: Secondary | ICD-10-CM | POA: Diagnosis not present

## 2020-09-10 DIAGNOSIS — I700 Atherosclerosis of aorta: Secondary | ICD-10-CM | POA: Diagnosis not present

## 2020-09-10 DIAGNOSIS — M47814 Spondylosis without myelopathy or radiculopathy, thoracic region: Secondary | ICD-10-CM | POA: Diagnosis not present

## 2020-09-10 DIAGNOSIS — Z48815 Encounter for surgical aftercare following surgery on the digestive system: Secondary | ICD-10-CM | POA: Diagnosis not present

## 2020-09-10 DIAGNOSIS — G40909 Epilepsy, unspecified, not intractable, without status epilepticus: Secondary | ICD-10-CM | POA: Diagnosis not present

## 2020-09-10 DIAGNOSIS — M4184 Other forms of scoliosis, thoracic region: Secondary | ICD-10-CM | POA: Diagnosis not present

## 2020-09-10 DIAGNOSIS — I10 Essential (primary) hypertension: Secondary | ICD-10-CM | POA: Diagnosis not present

## 2020-09-10 DIAGNOSIS — K219 Gastro-esophageal reflux disease without esophagitis: Secondary | ICD-10-CM | POA: Diagnosis not present

## 2020-09-10 DIAGNOSIS — J439 Emphysema, unspecified: Secondary | ICD-10-CM | POA: Diagnosis not present

## 2020-09-11 DIAGNOSIS — M4184 Other forms of scoliosis, thoracic region: Secondary | ICD-10-CM | POA: Diagnosis not present

## 2020-09-11 DIAGNOSIS — I10 Essential (primary) hypertension: Secondary | ICD-10-CM | POA: Diagnosis not present

## 2020-09-11 DIAGNOSIS — K219 Gastro-esophageal reflux disease without esophagitis: Secondary | ICD-10-CM | POA: Diagnosis not present

## 2020-09-11 DIAGNOSIS — G40909 Epilepsy, unspecified, not intractable, without status epilepticus: Secondary | ICD-10-CM | POA: Diagnosis not present

## 2020-09-11 DIAGNOSIS — I700 Atherosclerosis of aorta: Secondary | ICD-10-CM | POA: Diagnosis not present

## 2020-09-11 DIAGNOSIS — M47814 Spondylosis without myelopathy or radiculopathy, thoracic region: Secondary | ICD-10-CM | POA: Diagnosis not present

## 2020-09-11 DIAGNOSIS — Z48815 Encounter for surgical aftercare following surgery on the digestive system: Secondary | ICD-10-CM | POA: Diagnosis not present

## 2020-09-11 DIAGNOSIS — J439 Emphysema, unspecified: Secondary | ICD-10-CM | POA: Diagnosis not present

## 2020-09-11 DIAGNOSIS — R911 Solitary pulmonary nodule: Secondary | ICD-10-CM | POA: Diagnosis not present

## 2020-09-12 DIAGNOSIS — K219 Gastro-esophageal reflux disease without esophagitis: Secondary | ICD-10-CM | POA: Diagnosis not present

## 2020-09-12 DIAGNOSIS — G40909 Epilepsy, unspecified, not intractable, without status epilepticus: Secondary | ICD-10-CM | POA: Diagnosis not present

## 2020-09-12 DIAGNOSIS — R911 Solitary pulmonary nodule: Secondary | ICD-10-CM | POA: Diagnosis not present

## 2020-09-12 DIAGNOSIS — I10 Essential (primary) hypertension: Secondary | ICD-10-CM | POA: Diagnosis not present

## 2020-09-12 DIAGNOSIS — Z48815 Encounter for surgical aftercare following surgery on the digestive system: Secondary | ICD-10-CM | POA: Diagnosis not present

## 2020-09-12 DIAGNOSIS — I700 Atherosclerosis of aorta: Secondary | ICD-10-CM | POA: Diagnosis not present

## 2020-09-12 DIAGNOSIS — J439 Emphysema, unspecified: Secondary | ICD-10-CM | POA: Diagnosis not present

## 2020-09-12 DIAGNOSIS — M47814 Spondylosis without myelopathy or radiculopathy, thoracic region: Secondary | ICD-10-CM | POA: Diagnosis not present

## 2020-09-12 DIAGNOSIS — M4184 Other forms of scoliosis, thoracic region: Secondary | ICD-10-CM | POA: Diagnosis not present

## 2020-09-17 DIAGNOSIS — K219 Gastro-esophageal reflux disease without esophagitis: Secondary | ICD-10-CM | POA: Diagnosis not present

## 2020-09-17 DIAGNOSIS — R911 Solitary pulmonary nodule: Secondary | ICD-10-CM | POA: Diagnosis not present

## 2020-09-17 DIAGNOSIS — M47814 Spondylosis without myelopathy or radiculopathy, thoracic region: Secondary | ICD-10-CM | POA: Diagnosis not present

## 2020-09-17 DIAGNOSIS — G40909 Epilepsy, unspecified, not intractable, without status epilepticus: Secondary | ICD-10-CM | POA: Diagnosis not present

## 2020-09-17 DIAGNOSIS — Z48815 Encounter for surgical aftercare following surgery on the digestive system: Secondary | ICD-10-CM | POA: Diagnosis not present

## 2020-09-17 DIAGNOSIS — I10 Essential (primary) hypertension: Secondary | ICD-10-CM | POA: Diagnosis not present

## 2020-09-17 DIAGNOSIS — M4184 Other forms of scoliosis, thoracic region: Secondary | ICD-10-CM | POA: Diagnosis not present

## 2020-09-17 DIAGNOSIS — I700 Atherosclerosis of aorta: Secondary | ICD-10-CM | POA: Diagnosis not present

## 2020-09-17 DIAGNOSIS — J439 Emphysema, unspecified: Secondary | ICD-10-CM | POA: Diagnosis not present

## 2020-09-19 DIAGNOSIS — H401113 Primary open-angle glaucoma, right eye, severe stage: Secondary | ICD-10-CM | POA: Diagnosis not present

## 2020-09-19 DIAGNOSIS — H401122 Primary open-angle glaucoma, left eye, moderate stage: Secondary | ICD-10-CM | POA: Diagnosis not present

## 2020-09-20 DIAGNOSIS — M47814 Spondylosis without myelopathy or radiculopathy, thoracic region: Secondary | ICD-10-CM | POA: Diagnosis not present

## 2020-09-20 DIAGNOSIS — I10 Essential (primary) hypertension: Secondary | ICD-10-CM | POA: Diagnosis not present

## 2020-09-20 DIAGNOSIS — M4184 Other forms of scoliosis, thoracic region: Secondary | ICD-10-CM | POA: Diagnosis not present

## 2020-09-20 DIAGNOSIS — G40909 Epilepsy, unspecified, not intractable, without status epilepticus: Secondary | ICD-10-CM | POA: Diagnosis not present

## 2020-09-20 DIAGNOSIS — I700 Atherosclerosis of aorta: Secondary | ICD-10-CM | POA: Diagnosis not present

## 2020-09-20 DIAGNOSIS — K219 Gastro-esophageal reflux disease without esophagitis: Secondary | ICD-10-CM | POA: Diagnosis not present

## 2020-09-20 DIAGNOSIS — Z48815 Encounter for surgical aftercare following surgery on the digestive system: Secondary | ICD-10-CM | POA: Diagnosis not present

## 2020-09-20 DIAGNOSIS — J439 Emphysema, unspecified: Secondary | ICD-10-CM | POA: Diagnosis not present

## 2020-09-20 DIAGNOSIS — R911 Solitary pulmonary nodule: Secondary | ICD-10-CM | POA: Diagnosis not present

## 2020-09-23 DIAGNOSIS — J449 Chronic obstructive pulmonary disease, unspecified: Secondary | ICD-10-CM | POA: Diagnosis not present

## 2020-09-23 DIAGNOSIS — I272 Pulmonary hypertension, unspecified: Secondary | ICD-10-CM | POA: Diagnosis not present

## 2020-09-24 DIAGNOSIS — M47814 Spondylosis without myelopathy or radiculopathy, thoracic region: Secondary | ICD-10-CM | POA: Diagnosis not present

## 2020-09-24 DIAGNOSIS — Z48815 Encounter for surgical aftercare following surgery on the digestive system: Secondary | ICD-10-CM | POA: Diagnosis not present

## 2020-09-24 DIAGNOSIS — I700 Atherosclerosis of aorta: Secondary | ICD-10-CM | POA: Diagnosis not present

## 2020-09-24 DIAGNOSIS — J439 Emphysema, unspecified: Secondary | ICD-10-CM | POA: Diagnosis not present

## 2020-09-24 DIAGNOSIS — M4184 Other forms of scoliosis, thoracic region: Secondary | ICD-10-CM | POA: Diagnosis not present

## 2020-09-24 DIAGNOSIS — R911 Solitary pulmonary nodule: Secondary | ICD-10-CM | POA: Diagnosis not present

## 2020-09-24 DIAGNOSIS — I10 Essential (primary) hypertension: Secondary | ICD-10-CM | POA: Diagnosis not present

## 2020-09-24 DIAGNOSIS — G40909 Epilepsy, unspecified, not intractable, without status epilepticus: Secondary | ICD-10-CM | POA: Diagnosis not present

## 2020-09-24 DIAGNOSIS — K219 Gastro-esophageal reflux disease without esophagitis: Secondary | ICD-10-CM | POA: Diagnosis not present

## 2020-09-26 ENCOUNTER — Other Ambulatory Visit: Payer: Self-pay

## 2020-09-26 ENCOUNTER — Encounter: Payer: Self-pay | Admitting: General Surgery

## 2020-09-26 ENCOUNTER — Ambulatory Visit (INDEPENDENT_AMBULATORY_CARE_PROVIDER_SITE_OTHER): Payer: Medicare HMO | Admitting: General Surgery

## 2020-09-26 VITALS — BP 120/69 | HR 96 | Temp 98.5°F | Resp 14 | Wt 123.0 lb

## 2020-09-26 DIAGNOSIS — I700 Atherosclerosis of aorta: Secondary | ICD-10-CM | POA: Diagnosis not present

## 2020-09-26 DIAGNOSIS — K219 Gastro-esophageal reflux disease without esophagitis: Secondary | ICD-10-CM | POA: Diagnosis not present

## 2020-09-26 DIAGNOSIS — J439 Emphysema, unspecified: Secondary | ICD-10-CM | POA: Diagnosis not present

## 2020-09-26 DIAGNOSIS — M47814 Spondylosis without myelopathy or radiculopathy, thoracic region: Secondary | ICD-10-CM | POA: Diagnosis not present

## 2020-09-26 DIAGNOSIS — I10 Essential (primary) hypertension: Secondary | ICD-10-CM | POA: Diagnosis not present

## 2020-09-26 DIAGNOSIS — Z48815 Encounter for surgical aftercare following surgery on the digestive system: Secondary | ICD-10-CM | POA: Diagnosis not present

## 2020-09-26 DIAGNOSIS — R911 Solitary pulmonary nodule: Secondary | ICD-10-CM | POA: Diagnosis not present

## 2020-09-26 DIAGNOSIS — G40909 Epilepsy, unspecified, not intractable, without status epilepticus: Secondary | ICD-10-CM | POA: Diagnosis not present

## 2020-09-26 DIAGNOSIS — K562 Volvulus: Secondary | ICD-10-CM

## 2020-09-26 DIAGNOSIS — M4184 Other forms of scoliosis, thoracic region: Secondary | ICD-10-CM | POA: Diagnosis not present

## 2020-09-26 NOTE — Patient Instructions (Signed)
No heavy lifting > 10 lbs, excessive bending, pushing, pulling, or squatting for 6-8 weeks after surgery.  Diet as tolerated.

## 2020-09-26 NOTE — Progress Notes (Signed)
[facility] Note   HPI:  86 y.o. Female presents to clinic for follow-up evaluation after ex lap and reduction of volvulus. She is doing well. She is eating and having colostomy output.   Review of Systems:  Regular BM No fever  Improving strength  All other review of systems: otherwise negative   Vital Signs:  BP 120/69   Pulse 96   Temp 98.5 F (36.9 C) (Oral)   Resp 14   Wt 123 lb (55.8 kg)   SpO2 94%   BMI 21.79 kg/m    Physical Exam:  Physical Exam Vitals reviewed.  Cardiovascular:     Rate and Rhythm: Normal rate.  Pulmonary:     Effort: Pulmonary effort is normal.  Abdominal:     General: There is no distension.     Palpations: Abdomen is soft.     Tenderness: There is no abdominal tenderness.     Comments: Midline healing, no hernia, ostomy with stool in bag  Skin:    General: Skin is warm.  Neurological:     Mental Status: She is alert.      Assessment:  86 y.o. yo Female s/p ex lap and reduction of volvulus. She is doing well and tolerating a diet. She has an ostomy in place that she has had for some time and is not wanting it reversed.   Plan:   PRN followup  No heavy lifting > 10 lbs, excessive bending, pushing, pulling, or squatting for 6-8 weeks after surgery.  Diet as tolerated.   All of the above recommendations were discussed with the patient and patient's family, and all of patient's and family's questions were answered to their expressed satisfaction.  Algis Greenhouse, MD [facility] [address] [place], [place] [address] [phone] (office)

## 2020-09-30 DIAGNOSIS — Z933 Colostomy status: Secondary | ICD-10-CM | POA: Diagnosis not present

## 2020-09-30 DIAGNOSIS — K56609 Unspecified intestinal obstruction, unspecified as to partial versus complete obstruction: Secondary | ICD-10-CM | POA: Diagnosis not present

## 2020-09-30 DIAGNOSIS — I700 Atherosclerosis of aorta: Secondary | ICD-10-CM | POA: Diagnosis not present

## 2020-09-30 DIAGNOSIS — M47814 Spondylosis without myelopathy or radiculopathy, thoracic region: Secondary | ICD-10-CM | POA: Diagnosis not present

## 2020-09-30 DIAGNOSIS — I10 Essential (primary) hypertension: Secondary | ICD-10-CM | POA: Diagnosis not present

## 2020-09-30 DIAGNOSIS — G40909 Epilepsy, unspecified, not intractable, without status epilepticus: Secondary | ICD-10-CM | POA: Diagnosis not present

## 2020-09-30 DIAGNOSIS — M4184 Other forms of scoliosis, thoracic region: Secondary | ICD-10-CM | POA: Diagnosis not present

## 2020-09-30 DIAGNOSIS — Z48815 Encounter for surgical aftercare following surgery on the digestive system: Secondary | ICD-10-CM | POA: Diagnosis not present

## 2020-09-30 DIAGNOSIS — R911 Solitary pulmonary nodule: Secondary | ICD-10-CM | POA: Diagnosis not present

## 2020-09-30 DIAGNOSIS — K219 Gastro-esophageal reflux disease without esophagitis: Secondary | ICD-10-CM | POA: Diagnosis not present

## 2020-09-30 DIAGNOSIS — J439 Emphysema, unspecified: Secondary | ICD-10-CM | POA: Diagnosis not present

## 2020-10-02 DIAGNOSIS — I700 Atherosclerosis of aorta: Secondary | ICD-10-CM | POA: Diagnosis not present

## 2020-10-02 DIAGNOSIS — M4184 Other forms of scoliosis, thoracic region: Secondary | ICD-10-CM | POA: Diagnosis not present

## 2020-10-02 DIAGNOSIS — I10 Essential (primary) hypertension: Secondary | ICD-10-CM | POA: Diagnosis not present

## 2020-10-02 DIAGNOSIS — G40909 Epilepsy, unspecified, not intractable, without status epilepticus: Secondary | ICD-10-CM | POA: Diagnosis not present

## 2020-10-02 DIAGNOSIS — M47814 Spondylosis without myelopathy or radiculopathy, thoracic region: Secondary | ICD-10-CM | POA: Diagnosis not present

## 2020-10-02 DIAGNOSIS — J439 Emphysema, unspecified: Secondary | ICD-10-CM | POA: Diagnosis not present

## 2020-10-02 DIAGNOSIS — K219 Gastro-esophageal reflux disease without esophagitis: Secondary | ICD-10-CM | POA: Diagnosis not present

## 2020-10-02 DIAGNOSIS — R911 Solitary pulmonary nodule: Secondary | ICD-10-CM | POA: Diagnosis not present

## 2020-10-02 DIAGNOSIS — Z48815 Encounter for surgical aftercare following surgery on the digestive system: Secondary | ICD-10-CM | POA: Diagnosis not present

## 2020-10-04 DIAGNOSIS — E785 Hyperlipidemia, unspecified: Secondary | ICD-10-CM | POA: Diagnosis not present

## 2020-10-04 DIAGNOSIS — I110 Hypertensive heart disease with heart failure: Secondary | ICD-10-CM | POA: Diagnosis not present

## 2020-10-04 DIAGNOSIS — N182 Chronic kidney disease, stage 2 (mild): Secondary | ICD-10-CM | POA: Diagnosis not present

## 2020-10-04 DIAGNOSIS — E1122 Type 2 diabetes mellitus with diabetic chronic kidney disease: Secondary | ICD-10-CM | POA: Diagnosis not present

## 2020-10-04 DIAGNOSIS — E039 Hypothyroidism, unspecified: Secondary | ICD-10-CM | POA: Diagnosis not present

## 2020-10-04 DIAGNOSIS — I129 Hypertensive chronic kidney disease with stage 1 through stage 4 chronic kidney disease, or unspecified chronic kidney disease: Secondary | ICD-10-CM | POA: Diagnosis not present

## 2020-10-09 DIAGNOSIS — J439 Emphysema, unspecified: Secondary | ICD-10-CM | POA: Diagnosis not present

## 2020-10-09 DIAGNOSIS — R911 Solitary pulmonary nodule: Secondary | ICD-10-CM | POA: Diagnosis not present

## 2020-10-09 DIAGNOSIS — I10 Essential (primary) hypertension: Secondary | ICD-10-CM | POA: Diagnosis not present

## 2020-10-09 DIAGNOSIS — I700 Atherosclerosis of aorta: Secondary | ICD-10-CM | POA: Diagnosis not present

## 2020-10-09 DIAGNOSIS — K219 Gastro-esophageal reflux disease without esophagitis: Secondary | ICD-10-CM | POA: Diagnosis not present

## 2020-10-09 DIAGNOSIS — M4184 Other forms of scoliosis, thoracic region: Secondary | ICD-10-CM | POA: Diagnosis not present

## 2020-10-09 DIAGNOSIS — G40909 Epilepsy, unspecified, not intractable, without status epilepticus: Secondary | ICD-10-CM | POA: Diagnosis not present

## 2020-10-09 DIAGNOSIS — Z48815 Encounter for surgical aftercare following surgery on the digestive system: Secondary | ICD-10-CM | POA: Diagnosis not present

## 2020-10-09 DIAGNOSIS — M47814 Spondylosis without myelopathy or radiculopathy, thoracic region: Secondary | ICD-10-CM | POA: Diagnosis not present

## 2020-10-23 DIAGNOSIS — J449 Chronic obstructive pulmonary disease, unspecified: Secondary | ICD-10-CM | POA: Diagnosis not present

## 2020-10-23 DIAGNOSIS — I272 Pulmonary hypertension, unspecified: Secondary | ICD-10-CM | POA: Diagnosis not present

## 2020-10-30 DIAGNOSIS — K56609 Unspecified intestinal obstruction, unspecified as to partial versus complete obstruction: Secondary | ICD-10-CM | POA: Diagnosis not present

## 2020-10-30 DIAGNOSIS — Z933 Colostomy status: Secondary | ICD-10-CM | POA: Diagnosis not present

## 2020-10-31 DIAGNOSIS — Z933 Colostomy status: Secondary | ICD-10-CM | POA: Diagnosis not present

## 2020-10-31 DIAGNOSIS — K56609 Unspecified intestinal obstruction, unspecified as to partial versus complete obstruction: Secondary | ICD-10-CM | POA: Diagnosis not present

## 2020-11-23 DIAGNOSIS — J449 Chronic obstructive pulmonary disease, unspecified: Secondary | ICD-10-CM | POA: Diagnosis not present

## 2020-11-23 DIAGNOSIS — I272 Pulmonary hypertension, unspecified: Secondary | ICD-10-CM | POA: Diagnosis not present

## 2020-12-04 DIAGNOSIS — K56609 Unspecified intestinal obstruction, unspecified as to partial versus complete obstruction: Secondary | ICD-10-CM | POA: Diagnosis not present

## 2020-12-04 DIAGNOSIS — Z933 Colostomy status: Secondary | ICD-10-CM | POA: Diagnosis not present

## 2020-12-16 DIAGNOSIS — E039 Hypothyroidism, unspecified: Secondary | ICD-10-CM | POA: Diagnosis not present

## 2020-12-16 DIAGNOSIS — Z720 Tobacco use: Secondary | ICD-10-CM | POA: Diagnosis not present

## 2020-12-16 DIAGNOSIS — Z939 Artificial opening status, unspecified: Secondary | ICD-10-CM | POA: Diagnosis not present

## 2020-12-16 DIAGNOSIS — I10 Essential (primary) hypertension: Secondary | ICD-10-CM | POA: Diagnosis not present

## 2020-12-16 LAB — TSH: TSH: 0.36 — AB (ref 0.41–5.90)

## 2020-12-24 DIAGNOSIS — J449 Chronic obstructive pulmonary disease, unspecified: Secondary | ICD-10-CM | POA: Diagnosis not present

## 2020-12-24 DIAGNOSIS — I272 Pulmonary hypertension, unspecified: Secondary | ICD-10-CM | POA: Diagnosis not present

## 2020-12-27 DIAGNOSIS — H401132 Primary open-angle glaucoma, bilateral, moderate stage: Secondary | ICD-10-CM | POA: Diagnosis not present

## 2021-01-06 DIAGNOSIS — K56609 Unspecified intestinal obstruction, unspecified as to partial versus complete obstruction: Secondary | ICD-10-CM | POA: Diagnosis not present

## 2021-01-06 DIAGNOSIS — Z933 Colostomy status: Secondary | ICD-10-CM | POA: Diagnosis not present

## 2021-01-19 ENCOUNTER — Encounter: Payer: Self-pay | Admitting: Cardiology

## 2021-01-19 NOTE — Progress Notes (Signed)
Cardiology Office Note  Date: 01/20/2021   ID: Michelle Wall July 05, 1933, MRN [id number]  PCP:  Mirna Mires, MD  Cardiologist:  Nona Dell, MD Electrophysiologist:  None   Chief Complaint  Patient presents with  . Referred with heart murmur    History of Present Illness: Michelle Wall is an 87 y.o. female referred for cardiology consultation by Dr. Loleta Chance for cardiology evaluation with reported history of heart murmur.  She is here today with her daughter.  I reviewed the available records.  She does not report any specific exertional symptoms at this time.  Lives in her own home and is functional with ADLs.  Using a walker, no recent falls.  She does not report any leg swelling at this time, no orthopnea or PND.  She states that she has been aware of having had a heart murmur for quite some time.  Records indicate tricuspid regurgitation and pulmonary hypertension, unclear when this was last assessed.  I could not locate a prior echocardiogram.  She was followed by [facility] several years ago.  I reviewed her medications which are listed below.  Systolics generally 130s to 150s at home.  I reviewed her most recent ECG as noted below.  Past Medical History:  Diagnosis Date  . Aortic atherosclerosis (HCC)   . Carotid atherosclerosis   . Cor pulmonale (HCC)   . Coronary artery calcification seen on CT scan   . Emphysema   . Essential hypertension   . Hypothyroidism   . Pulmonary hypertension (HCC)   . Tricuspid insufficiency     Past Surgical History:  Procedure Laterality Date  . COLONOSCOPY N/A 12/14/2018   Procedure: COLONOSCOPY;  Surgeon: Malissa Hippo, MD;  [facility];  Service: Endoscopy;  Laterality: N/A;  1:00  . COLOSTOMY Left 06/14/2018   Procedure: COLOSTOMY;  Surgeon: Lucretia Roers, MD;  Location: [facility] ORS;  Service: General;  Laterality: Left;  . LAPAROTOMY N/A 08/09/2020   Procedure: EXPLORATORY LAPAROTOMY;  Surgeon: Lucretia Roers, MD;  Location: [facility];  Service: General;  Laterality: N/A;  . LYSIS OF ADHESION N/A 08/09/2020   Procedure: LYSIS OF ADHESIONS;  Surgeon: Lucretia Roers, MD;  Location: [facility];  Service: General;  Laterality: N/A;  . PARTIAL COLECTOMY N/A 06/14/2018   Procedure: PARTIAL COLECTOMY;  Surgeon: Lucretia Roers, MD;  Location: [facility];  Service: General;  Laterality: N/A;  . POLYPECTOMY  12/14/2018   Procedure: POLYPECTOMY;  Surgeon: Malissa Hippo, MD;  [facility];  Service: Endoscopy;;  rectum  . THYROIDECTOMY, PARTIAL    . TUBAL LIGATION     midline scar ? possibly tubal ligation    Current Outpatient Medications  Medication Sig Dispense Refill  . aspirin EC 81 MG tablet Take 1 tablet (81 mg total) by mouth daily with breakfast. 30 tablet 11  . bumetanide (BUMEX) 0.5 MG tablet Take 0.5 mg by mouth daily.    . cholecalciferol (VITAMIN D3) 25 MCG (1000 UT) tablet Take 1,000 Units by mouth daily.    Marland Kitchen docusate sodium (COLACE) 100 MG capsule Take 1 capsule (100 mg total) by mouth 2 (two) times daily. 60 capsule 2  . latanoprost (XALATAN) 0.005 % ophthalmic solution Place 1 drop into both eyes at bedtime.    . ondansetron (ZOFRAN) 4 MG tablet Take 1 tablet (4 mg total) by mouth every 6 (six) hours as needed for nausea. 20 tablet 0  . pantoprazole (PROTONIX) 40 MG  tablet Take 1 tablet (40 mg total) by mouth daily at 12 noon. 30 tablet 2  . potassium chloride SA (KLOR-CON) 20 MEQ tablet Take 20 mEq by mouth daily.    Marland Kitchen SYNTHROID 100 MCG tablet Take 100 mcg by mouth daily before breakfast.   3  . tiotropium (SPIRIVA HANDIHALER) 18 MCG inhalation capsule Place 1 capsule (18 mcg total) into inhaler and inhale daily. 30 capsule 2  . verapamil (CALAN-SR) 240 MG CR tablet Take 240 mg by mouth daily.      No current facility-administered medications for this visit.   Allergies:  Patient has no known allergies.   Social History: The patient  reports that she has been smoking  cigarettes. She has a 45.00 pack-year smoking history. She has never used smokeless tobacco. She reports that she does not drink alcohol and does not use drugs.   Family History: Patient's parents are both deceased, she cannot recall any specific medical conditions.  ROS: No syncope.  Physical Exam: VS:  BP (!) 162/70   Pulse 75   Ht 5\' 2"  (1.575 m)   Wt 128 lb 12.8 oz (58.4 kg)   SpO2 95%   BMI 23.56 kg/m , BMI Body mass index is 23.56 kg/m.  Wt Readings from Last 3 Encounters:  01/20/21 128 lb 12.8 oz (58.4 kg)  09/26/20 123 lb (55.8 kg)  08/22/20 140 lb (63.5 kg)    General: Pleasant elderly woman, appears comfortable at rest.  Using a cane. HEENT: Conjunctiva and lids normal, wearing a mask. Neck: Supple, no elevated JVP or carotid bruits, no thyromegaly. Lungs: Clear to auscultation, nonlabored breathing at rest. Cardiac: Regular rate and rhythm, no S3, 2/6 systolic murmur, no pericardial rub. Abdomen: Soft, nontender, bowel sounds present. Extremities: No pitting edema, distal pulses 2+. Skin: Warm and dry. Musculoskeletal: Mild kyphosis. Neuropsychiatric: Alert and oriented x3, affect grossly appropriate.  ECG:  An ECG dated 08/11/2020 was personally reviewed today and demonstrated:  Sinus rhythm with PACs and PVCs.  Recent Labwork: 08/08/2020: TSH 0.777 08/19/2020: ALT 154; AST 119; BUN 18; Creatinine, Ser 0.67; Hemoglobin 9.5; Magnesium 1.8; Platelets 331; Potassium 4.2; Sodium 143     Component Value Date/Time   TRIG 88 08/19/2020 0550  January 2022: TSH 0.36, BUN 18, creatinine 0.86, potassium 4.6, AST 14, ALT 7, hemoglobin 12.8, platelets 217  Other Studies Reviewed Today:  CT abdomen and pelvis 08/15/2020: IMPRESSION: 1. Redemonstrated postoperative findings status post Hartmann procedure sigmoid colon resection with left lower quadrant end colostomy and rectal stump. 2. 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 There is however an abrupt caliber change of the mid to distal small bowel in the central abdomen. The distal small bowel is completely decompressed. Findings are concerning for small bowel obstruction secondary to adhesion. 3. Esophagogastric tube is positioned with tip in the gastric fundus, side port at the level of the gastroesophageal junction. Recommend advancement to ensure subdiaphragmatic positioning. 4. Small volume ascites, similar to prior examination. 5. Small bilateral pleural effusions associated atelectasis or consolidation, increased compared to prior examination. 6. Coronary artery disease.  Aortic Atherosclerosis (ICD10-I70.0).  Assessment and Plan:  1.  Systolic murmur with reported history of tricuspid regurgitation and pulmonary hypertension.  I cannot locate any prior echocardiogram reports.  We will obtain a follow-up echocardiogram for reevaluation.  She does not describe any unusual shortness of breath at this time with current ADLs, no  leg swelling.  Presently on Bumex with potassium supplement per Dr. Loleta Chance.  2.  Essential hypertension, blood pressure elevated today, systolics 130s to 150s at home.  She is on Calan SR with follow-up by Dr. Loleta Chance.  3.  Incidentally noted coronary artery calcification and aortic atherosclerosis by CT imaging.  She is on aspirin at this time, suggest follow-up lipids with Dr. Loleta Chance in consideration of statin therapy.  Medication Adjustments/Labs and Tests Ordered: Current medicines are reviewed at length with the patient today.  Concerns regarding medicines are outlined above.   Tests Ordered: Orders Placed This Encounter  Procedures  . ECHOCARDIOGRAM COMPLETE    Medication Changes: No orders of the defined types were placed in this encounter.   Disposition:  Follow up test results.  Signed, Jonelle Sidle, MD, [facility] 01/20/2021 9:31 AM    [facility] at  [facility] [address] [place], [place], [place] [address] Phone: [phone]; Fax: [phone]

## 2021-01-20 ENCOUNTER — Encounter: Payer: Self-pay | Admitting: Cardiology

## 2021-01-20 ENCOUNTER — Ambulatory Visit (INDEPENDENT_AMBULATORY_CARE_PROVIDER_SITE_OTHER): Payer: Medicare Other | Admitting: Cardiology

## 2021-01-20 VITALS — BP 162/70 | HR 75 | Ht 62.0 in | Wt 128.8 lb

## 2021-01-20 DIAGNOSIS — I10 Essential (primary) hypertension: Secondary | ICD-10-CM

## 2021-01-20 DIAGNOSIS — R600 Localized edema: Secondary | ICD-10-CM

## 2021-01-20 DIAGNOSIS — R011 Cardiac murmur, unspecified: Secondary | ICD-10-CM

## 2021-01-20 DIAGNOSIS — I2510 Atherosclerotic heart disease of native coronary artery without angina pectoris: Secondary | ICD-10-CM

## 2021-01-20 NOTE — Patient Instructions (Signed)
Your physician recommends that you schedule a follow-up appointment in: PENDING WITH DR MCDOWELL  Your physician recommends that you continue on your current medications as directed. Please refer to the Current Medication list given to you today.  Your physician has requested that you have an echocardiogram. Echocardiography is a painless test that uses sound waves to create images of your heart. It provides your doctor with information about the size and shape of your heart and how well your heart's chambers and valves are working. This procedure takes approximately one hour. There are no restrictions for this procedure.  Thank you for choosing [facility]!!    

## 2021-01-21 DIAGNOSIS — I272 Pulmonary hypertension, unspecified: Secondary | ICD-10-CM | POA: Diagnosis not present

## 2021-01-21 DIAGNOSIS — J449 Chronic obstructive pulmonary disease, unspecified: Secondary | ICD-10-CM | POA: Diagnosis not present

## 2021-01-31 DIAGNOSIS — Z933 Colostomy status: Secondary | ICD-10-CM | POA: Diagnosis not present

## 2021-01-31 DIAGNOSIS — K56609 Unspecified intestinal obstruction, unspecified as to partial versus complete obstruction: Secondary | ICD-10-CM | POA: Diagnosis not present

## 2021-02-05 ENCOUNTER — Other Ambulatory Visit: Payer: Self-pay

## 2021-02-05 ENCOUNTER — Ambulatory Visit (HOSPITAL_COMMUNITY)
Admission: RE | Admit: 2021-02-05 | Discharge: 2021-02-05 | Disposition: A | Discharge status: Home / Self Care | Payer: Medicare Other | Source: Ambulatory Visit | Attending: Cardiology | Admitting: Cardiology

## 2021-02-05 DIAGNOSIS — R011 Cardiac murmur, unspecified: Secondary | ICD-10-CM

## 2021-02-05 LAB — ECHOCARDIOGRAM COMPLETE
Area-P 1/2: 2.78 cm2
P 1/2 time: 343 msec
S' Lateral: 2.8 cm

## 2021-02-05 NOTE — Progress Notes (Signed)
*  PRELIMINARY RESULTS* Echocardiogram 2D Echocardiogram has been performed.  Stacey Drain 02/05/2021, 2:48 PM

## 2021-02-06 ENCOUNTER — Ambulatory Visit: Payer: Medicare HMO | Admitting: Cardiology

## 2021-02-07 ENCOUNTER — Telehealth: Payer: Self-pay | Admitting: *Deleted

## 2021-02-07 NOTE — Telephone Encounter (Signed)
-----   Message from Jonelle Sidle, MD sent at 02/05/2021  7:10 PM EDT ----- Results reviewed.  Echocardiogram shows vigorous LVEF at 65 to 70%.  Tricuspid regurgitation is mild to moderate range, there is also mild calcification of the aortic valve but no stenosis.  Tricuspid regurgitation likely corresponds to a heart murmur but estimated pulmonary artery systolic pressure is only mildly increased and she was not noticing any symptoms at recent visit.  There is also mild to moderate aortic regurgitation but this is likely to be asymptomatic as well.  Would keep follow-up with PCP and schedule a 1 year office visit with [place].

## 2021-02-07 NOTE — Telephone Encounter (Signed)
Patient informed. Copy sent to PCP °

## 2021-02-20 DIAGNOSIS — I10 Essential (primary) hypertension: Secondary | ICD-10-CM | POA: Diagnosis not present

## 2021-02-20 DIAGNOSIS — E039 Hypothyroidism, unspecified: Secondary | ICD-10-CM | POA: Diagnosis not present

## 2021-02-20 DIAGNOSIS — E785 Hyperlipidemia, unspecified: Secondary | ICD-10-CM | POA: Diagnosis not present

## 2021-02-21 DIAGNOSIS — J449 Chronic obstructive pulmonary disease, unspecified: Secondary | ICD-10-CM | POA: Diagnosis not present

## 2021-02-21 DIAGNOSIS — I272 Pulmonary hypertension, unspecified: Secondary | ICD-10-CM | POA: Diagnosis not present

## 2021-02-27 DIAGNOSIS — E063 Autoimmune thyroiditis: Secondary | ICD-10-CM | POA: Diagnosis not present

## 2021-02-27 DIAGNOSIS — E785 Hyperlipidemia, unspecified: Secondary | ICD-10-CM | POA: Diagnosis not present

## 2021-02-27 DIAGNOSIS — E890 Postprocedural hypothyroidism: Secondary | ICD-10-CM | POA: Diagnosis not present

## 2021-03-10 DIAGNOSIS — K56609 Unspecified intestinal obstruction, unspecified as to partial versus complete obstruction: Secondary | ICD-10-CM | POA: Diagnosis not present

## 2021-03-10 DIAGNOSIS — Z933 Colostomy status: Secondary | ICD-10-CM | POA: Diagnosis not present

## 2021-03-12 DIAGNOSIS — I10 Essential (primary) hypertension: Secondary | ICD-10-CM | POA: Diagnosis not present

## 2021-03-12 DIAGNOSIS — E063 Autoimmune thyroiditis: Secondary | ICD-10-CM | POA: Diagnosis not present

## 2021-03-12 DIAGNOSIS — E785 Hyperlipidemia, unspecified: Secondary | ICD-10-CM | POA: Diagnosis not present

## 2021-03-12 DIAGNOSIS — E890 Postprocedural hypothyroidism: Secondary | ICD-10-CM | POA: Diagnosis not present

## 2021-03-12 DIAGNOSIS — E042 Nontoxic multinodular goiter: Secondary | ICD-10-CM | POA: Diagnosis not present

## 2021-03-12 DIAGNOSIS — F17210 Nicotine dependence, cigarettes, uncomplicated: Secondary | ICD-10-CM | POA: Diagnosis not present

## 2021-03-22 DIAGNOSIS — E785 Hyperlipidemia, unspecified: Secondary | ICD-10-CM | POA: Diagnosis not present

## 2021-03-22 DIAGNOSIS — E039 Hypothyroidism, unspecified: Secondary | ICD-10-CM | POA: Diagnosis not present

## 2021-03-22 DIAGNOSIS — I10 Essential (primary) hypertension: Secondary | ICD-10-CM | POA: Diagnosis not present

## 2021-03-23 DIAGNOSIS — J449 Chronic obstructive pulmonary disease, unspecified: Secondary | ICD-10-CM | POA: Diagnosis not present

## 2021-03-23 DIAGNOSIS — I272 Pulmonary hypertension, unspecified: Secondary | ICD-10-CM | POA: Diagnosis not present

## 2021-04-11 DIAGNOSIS — Z933 Colostomy status: Secondary | ICD-10-CM | POA: Diagnosis not present

## 2021-04-11 DIAGNOSIS — K56609 Unspecified intestinal obstruction, unspecified as to partial versus complete obstruction: Secondary | ICD-10-CM | POA: Diagnosis not present

## 2021-04-23 DIAGNOSIS — J449 Chronic obstructive pulmonary disease, unspecified: Secondary | ICD-10-CM | POA: Diagnosis not present

## 2021-04-23 DIAGNOSIS — I272 Pulmonary hypertension, unspecified: Secondary | ICD-10-CM | POA: Diagnosis not present

## 2021-05-07 DIAGNOSIS — Z933 Colostomy status: Secondary | ICD-10-CM | POA: Diagnosis not present

## 2021-05-07 DIAGNOSIS — K56609 Unspecified intestinal obstruction, unspecified as to partial versus complete obstruction: Secondary | ICD-10-CM | POA: Diagnosis not present

## 2021-05-23 DIAGNOSIS — J449 Chronic obstructive pulmonary disease, unspecified: Secondary | ICD-10-CM | POA: Diagnosis not present

## 2021-05-23 DIAGNOSIS — I272 Pulmonary hypertension, unspecified: Secondary | ICD-10-CM | POA: Diagnosis not present

## 2021-05-27 DIAGNOSIS — E039 Hypothyroidism, unspecified: Secondary | ICD-10-CM | POA: Diagnosis not present

## 2021-05-27 DIAGNOSIS — Z933 Colostomy status: Secondary | ICD-10-CM | POA: Diagnosis not present

## 2021-05-27 DIAGNOSIS — Z720 Tobacco use: Secondary | ICD-10-CM | POA: Diagnosis not present

## 2021-05-27 DIAGNOSIS — I10 Essential (primary) hypertension: Secondary | ICD-10-CM | POA: Diagnosis not present

## 2021-06-05 DIAGNOSIS — K56609 Unspecified intestinal obstruction, unspecified as to partial versus complete obstruction: Secondary | ICD-10-CM | POA: Diagnosis not present

## 2021-06-05 DIAGNOSIS — Z933 Colostomy status: Secondary | ICD-10-CM | POA: Diagnosis not present

## 2021-06-22 DIAGNOSIS — E039 Hypothyroidism, unspecified: Secondary | ICD-10-CM | POA: Diagnosis not present

## 2021-06-22 DIAGNOSIS — I10 Essential (primary) hypertension: Secondary | ICD-10-CM | POA: Diagnosis not present

## 2021-06-22 DIAGNOSIS — Z720 Tobacco use: Secondary | ICD-10-CM | POA: Diagnosis not present

## 2021-06-22 DIAGNOSIS — E785 Hyperlipidemia, unspecified: Secondary | ICD-10-CM | POA: Diagnosis not present

## 2021-06-23 DIAGNOSIS — I272 Pulmonary hypertension, unspecified: Secondary | ICD-10-CM | POA: Diagnosis not present

## 2021-06-23 DIAGNOSIS — J449 Chronic obstructive pulmonary disease, unspecified: Secondary | ICD-10-CM | POA: Diagnosis not present

## 2021-07-04 DIAGNOSIS — K56609 Unspecified intestinal obstruction, unspecified as to partial versus complete obstruction: Secondary | ICD-10-CM | POA: Diagnosis not present

## 2021-07-04 DIAGNOSIS — Z933 Colostomy status: Secondary | ICD-10-CM | POA: Diagnosis not present

## 2021-07-09 DIAGNOSIS — H401132 Primary open-angle glaucoma, bilateral, moderate stage: Secondary | ICD-10-CM | POA: Diagnosis not present

## 2021-07-24 DIAGNOSIS — J449 Chronic obstructive pulmonary disease, unspecified: Secondary | ICD-10-CM | POA: Diagnosis not present

## 2021-07-24 DIAGNOSIS — I272 Pulmonary hypertension, unspecified: Secondary | ICD-10-CM | POA: Diagnosis not present

## 2021-08-06 DIAGNOSIS — K56609 Unspecified intestinal obstruction, unspecified as to partial versus complete obstruction: Secondary | ICD-10-CM | POA: Diagnosis not present

## 2021-08-06 DIAGNOSIS — Z933 Colostomy status: Secondary | ICD-10-CM | POA: Diagnosis not present

## 2021-08-22 DIAGNOSIS — E785 Hyperlipidemia, unspecified: Secondary | ICD-10-CM | POA: Diagnosis not present

## 2021-08-22 DIAGNOSIS — Z720 Tobacco use: Secondary | ICD-10-CM | POA: Diagnosis not present

## 2021-08-22 DIAGNOSIS — I10 Essential (primary) hypertension: Secondary | ICD-10-CM | POA: Diagnosis not present

## 2021-08-22 DIAGNOSIS — E039 Hypothyroidism, unspecified: Secondary | ICD-10-CM | POA: Diagnosis not present

## 2021-08-23 DIAGNOSIS — I272 Pulmonary hypertension, unspecified: Secondary | ICD-10-CM | POA: Diagnosis not present

## 2021-08-23 DIAGNOSIS — J449 Chronic obstructive pulmonary disease, unspecified: Secondary | ICD-10-CM | POA: Diagnosis not present

## 2021-09-02 DIAGNOSIS — Z939 Artificial opening status, unspecified: Secondary | ICD-10-CM | POA: Diagnosis not present

## 2021-09-02 DIAGNOSIS — Z23 Encounter for immunization: Secondary | ICD-10-CM | POA: Diagnosis not present

## 2021-09-02 DIAGNOSIS — I10 Essential (primary) hypertension: Secondary | ICD-10-CM | POA: Diagnosis not present

## 2021-09-02 DIAGNOSIS — E039 Hypothyroidism, unspecified: Secondary | ICD-10-CM | POA: Diagnosis not present

## 2021-09-02 DIAGNOSIS — Z933 Colostomy status: Secondary | ICD-10-CM | POA: Diagnosis not present

## 2021-09-02 DIAGNOSIS — Z720 Tobacco use: Secondary | ICD-10-CM | POA: Diagnosis not present

## 2021-09-04 DIAGNOSIS — K56609 Unspecified intestinal obstruction, unspecified as to partial versus complete obstruction: Secondary | ICD-10-CM | POA: Diagnosis not present

## 2021-09-04 DIAGNOSIS — Z933 Colostomy status: Secondary | ICD-10-CM | POA: Diagnosis not present

## 2021-09-22 DIAGNOSIS — Z720 Tobacco use: Secondary | ICD-10-CM | POA: Diagnosis not present

## 2021-09-22 DIAGNOSIS — E785 Hyperlipidemia, unspecified: Secondary | ICD-10-CM | POA: Diagnosis not present

## 2021-09-22 DIAGNOSIS — E039 Hypothyroidism, unspecified: Secondary | ICD-10-CM | POA: Diagnosis not present

## 2021-09-22 DIAGNOSIS — I10 Essential (primary) hypertension: Secondary | ICD-10-CM | POA: Diagnosis not present

## 2021-09-23 DIAGNOSIS — J449 Chronic obstructive pulmonary disease, unspecified: Secondary | ICD-10-CM | POA: Diagnosis not present

## 2021-09-23 DIAGNOSIS — I272 Pulmonary hypertension, unspecified: Secondary | ICD-10-CM | POA: Diagnosis not present

## 2021-10-07 DIAGNOSIS — K56609 Unspecified intestinal obstruction, unspecified as to partial versus complete obstruction: Secondary | ICD-10-CM | POA: Diagnosis not present

## 2021-10-07 DIAGNOSIS — Z933 Colostomy status: Secondary | ICD-10-CM | POA: Diagnosis not present

## 2021-10-22 DIAGNOSIS — E039 Hypothyroidism, unspecified: Secondary | ICD-10-CM | POA: Diagnosis not present

## 2021-10-22 DIAGNOSIS — Z720 Tobacco use: Secondary | ICD-10-CM | POA: Diagnosis not present

## 2021-10-22 DIAGNOSIS — E785 Hyperlipidemia, unspecified: Secondary | ICD-10-CM | POA: Diagnosis not present

## 2021-10-22 DIAGNOSIS — I10 Essential (primary) hypertension: Secondary | ICD-10-CM | POA: Diagnosis not present

## 2021-10-23 DIAGNOSIS — I272 Pulmonary hypertension, unspecified: Secondary | ICD-10-CM | POA: Diagnosis not present

## 2021-10-23 DIAGNOSIS — J449 Chronic obstructive pulmonary disease, unspecified: Secondary | ICD-10-CM | POA: Diagnosis not present

## 2021-11-05 DIAGNOSIS — K56609 Unspecified intestinal obstruction, unspecified as to partial versus complete obstruction: Secondary | ICD-10-CM | POA: Diagnosis not present

## 2021-11-05 DIAGNOSIS — Z933 Colostomy status: Secondary | ICD-10-CM | POA: Diagnosis not present

## 2021-11-23 DIAGNOSIS — J449 Chronic obstructive pulmonary disease, unspecified: Secondary | ICD-10-CM | POA: Diagnosis not present

## 2021-11-23 DIAGNOSIS — I272 Pulmonary hypertension, unspecified: Secondary | ICD-10-CM | POA: Diagnosis not present

## 2021-12-12 DIAGNOSIS — Z933 Colostomy status: Secondary | ICD-10-CM | POA: Diagnosis not present

## 2021-12-12 DIAGNOSIS — K56609 Unspecified intestinal obstruction, unspecified as to partial versus complete obstruction: Secondary | ICD-10-CM | POA: Diagnosis not present

## 2021-12-24 DIAGNOSIS — J449 Chronic obstructive pulmonary disease, unspecified: Secondary | ICD-10-CM | POA: Diagnosis not present

## 2021-12-24 DIAGNOSIS — I272 Pulmonary hypertension, unspecified: Secondary | ICD-10-CM | POA: Diagnosis not present

## 2021-12-30 DIAGNOSIS — H401133 Primary open-angle glaucoma, bilateral, severe stage: Secondary | ICD-10-CM | POA: Diagnosis not present

## 2021-12-31 DIAGNOSIS — H5213 Myopia, bilateral: Secondary | ICD-10-CM | POA: Diagnosis not present

## 2022-01-09 DIAGNOSIS — Z933 Colostomy status: Secondary | ICD-10-CM | POA: Diagnosis not present

## 2022-01-09 DIAGNOSIS — K56609 Unspecified intestinal obstruction, unspecified as to partial versus complete obstruction: Secondary | ICD-10-CM | POA: Diagnosis not present

## 2022-01-13 DIAGNOSIS — I10 Essential (primary) hypertension: Secondary | ICD-10-CM | POA: Diagnosis not present

## 2022-01-13 DIAGNOSIS — E039 Hypothyroidism, unspecified: Secondary | ICD-10-CM | POA: Diagnosis not present

## 2022-01-13 DIAGNOSIS — Z933 Colostomy status: Secondary | ICD-10-CM | POA: Diagnosis not present

## 2022-01-13 DIAGNOSIS — Z720 Tobacco use: Secondary | ICD-10-CM | POA: Diagnosis not present

## 2022-01-13 DIAGNOSIS — E785 Hyperlipidemia, unspecified: Secondary | ICD-10-CM | POA: Diagnosis not present

## 2022-01-21 DIAGNOSIS — J449 Chronic obstructive pulmonary disease, unspecified: Secondary | ICD-10-CM | POA: Diagnosis not present

## 2022-01-21 DIAGNOSIS — I272 Pulmonary hypertension, unspecified: Secondary | ICD-10-CM | POA: Diagnosis not present

## 2022-02-05 DIAGNOSIS — H52223 Regular astigmatism, bilateral: Secondary | ICD-10-CM | POA: Diagnosis not present

## 2022-02-05 DIAGNOSIS — H524 Presbyopia: Secondary | ICD-10-CM | POA: Diagnosis not present

## 2022-02-06 DIAGNOSIS — K56609 Unspecified intestinal obstruction, unspecified as to partial versus complete obstruction: Secondary | ICD-10-CM | POA: Diagnosis not present

## 2022-02-06 DIAGNOSIS — Z933 Colostomy status: Secondary | ICD-10-CM | POA: Diagnosis not present

## 2022-02-21 DIAGNOSIS — J449 Chronic obstructive pulmonary disease, unspecified: Secondary | ICD-10-CM | POA: Diagnosis not present

## 2022-02-21 DIAGNOSIS — I272 Pulmonary hypertension, unspecified: Secondary | ICD-10-CM | POA: Diagnosis not present

## 2022-03-06 DIAGNOSIS — E890 Postprocedural hypothyroidism: Secondary | ICD-10-CM | POA: Diagnosis not present

## 2022-03-06 DIAGNOSIS — Z933 Colostomy status: Secondary | ICD-10-CM | POA: Diagnosis not present

## 2022-03-06 DIAGNOSIS — E042 Nontoxic multinodular goiter: Secondary | ICD-10-CM | POA: Diagnosis not present

## 2022-03-06 DIAGNOSIS — K56609 Unspecified intestinal obstruction, unspecified as to partial versus complete obstruction: Secondary | ICD-10-CM | POA: Diagnosis not present

## 2022-03-06 DIAGNOSIS — E785 Hyperlipidemia, unspecified: Secondary | ICD-10-CM | POA: Diagnosis not present

## 2022-03-06 DIAGNOSIS — E063 Autoimmune thyroiditis: Secondary | ICD-10-CM | POA: Diagnosis not present

## 2022-03-13 DIAGNOSIS — F17210 Nicotine dependence, cigarettes, uncomplicated: Secondary | ICD-10-CM | POA: Diagnosis not present

## 2022-03-13 DIAGNOSIS — E785 Hyperlipidemia, unspecified: Secondary | ICD-10-CM | POA: Diagnosis not present

## 2022-03-13 DIAGNOSIS — E063 Autoimmune thyroiditis: Secondary | ICD-10-CM | POA: Diagnosis not present

## 2022-03-13 DIAGNOSIS — E042 Nontoxic multinodular goiter: Secondary | ICD-10-CM | POA: Diagnosis not present

## 2022-03-13 DIAGNOSIS — I10 Essential (primary) hypertension: Secondary | ICD-10-CM | POA: Diagnosis not present

## 2022-03-13 DIAGNOSIS — E890 Postprocedural hypothyroidism: Secondary | ICD-10-CM | POA: Diagnosis not present

## 2022-03-22 IMAGING — CT CT ABD-PELV W/ CM
2 of 5 series · 16 of 46 positions shown, 18 images · IV contrast (Omnipaque or Isovue)
Comparison: 06/14/2018

CLINICAL DATA: Upper abdominal pain, emesis

EXAM:
CT ABDOMEN AND PELVIS WITH CONTRAST
TECHNIQUE: Multidetector CT imaging of the abdomen and pelvis was performed
using the standard protocol following bolus administration of
intravenous contrast.
CONTRAST:  100mL OMNIPAQUE IOHEXOL 300 MG/ML  SOLN

[Series 2: axial st · axial · 0.72mm/px · z∈[+727,+1117]mm · 13 of 90 slices shown, 15 images]
[im 6/90  soft-tissue]
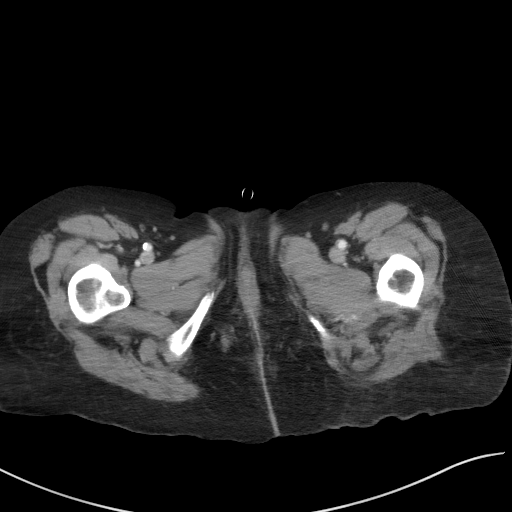
[im 6/90  bone]
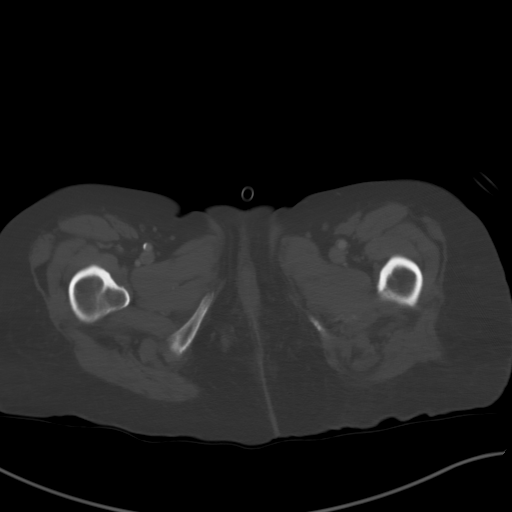
[im 11/90  soft-tissue]
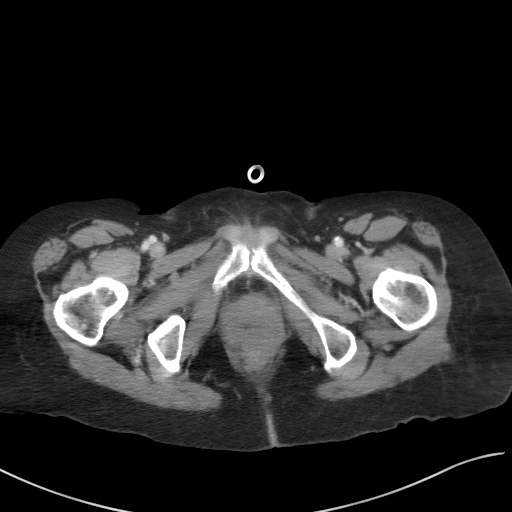
[im 21/90  soft-tissue]
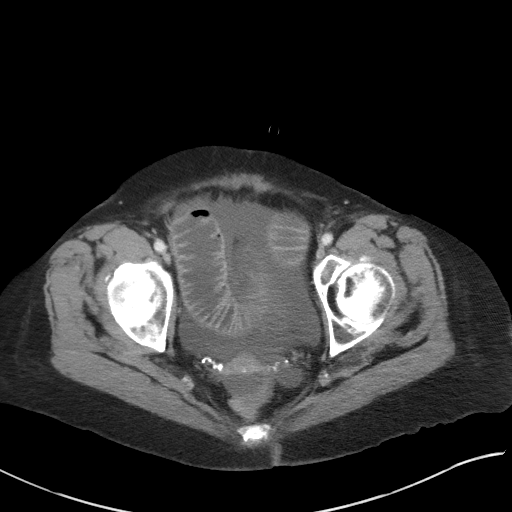
[im 27/90  soft-tissue]
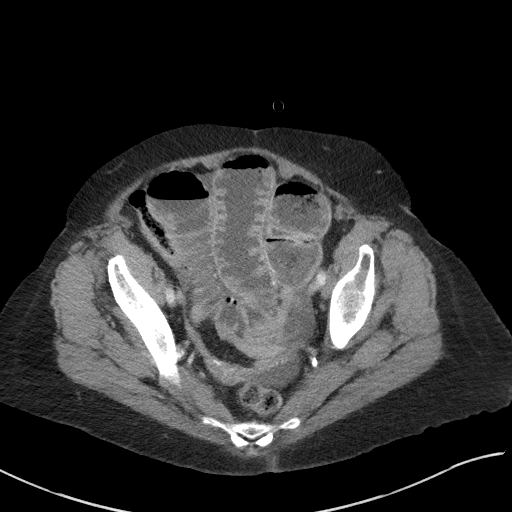
[im 32/90  soft-tissue]
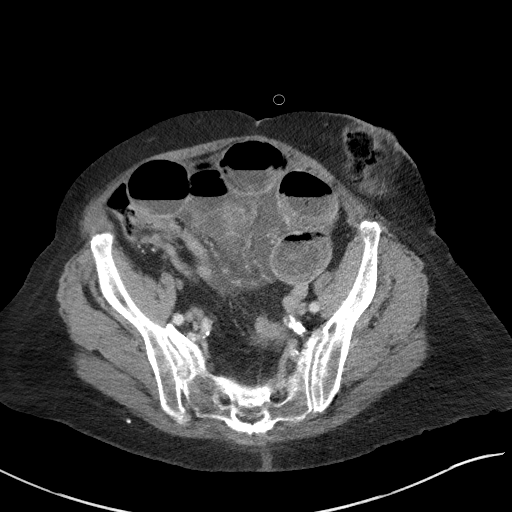
[im 37/90  soft-tissue]
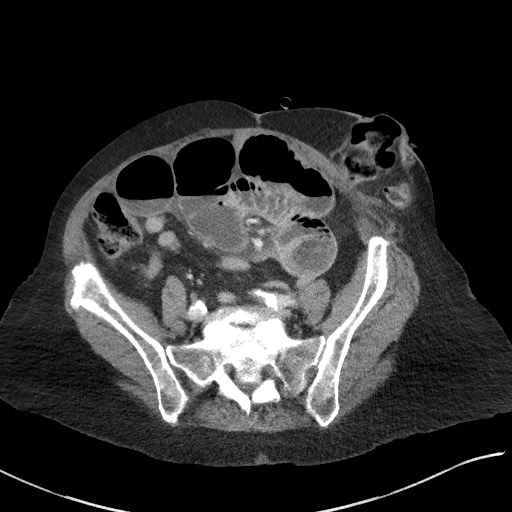
[im 48/90  soft-tissue]
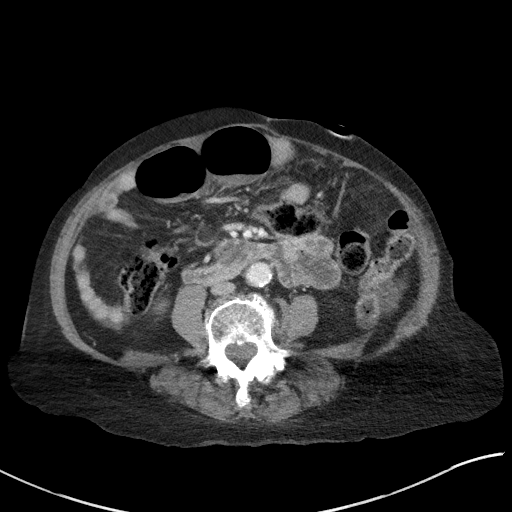
[im 53/90  soft-tissue]
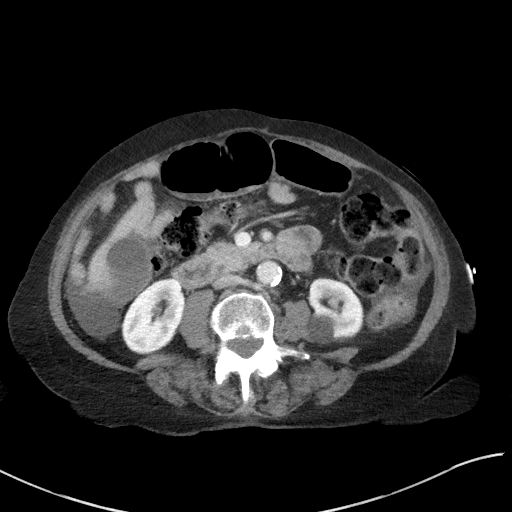
[im 58/90  soft-tissue]
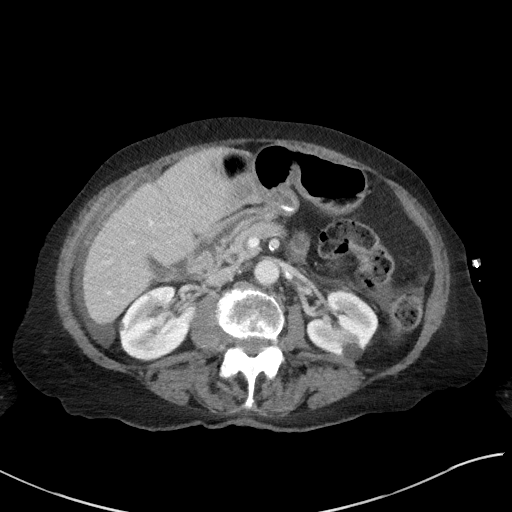
[im 58/90  bone]
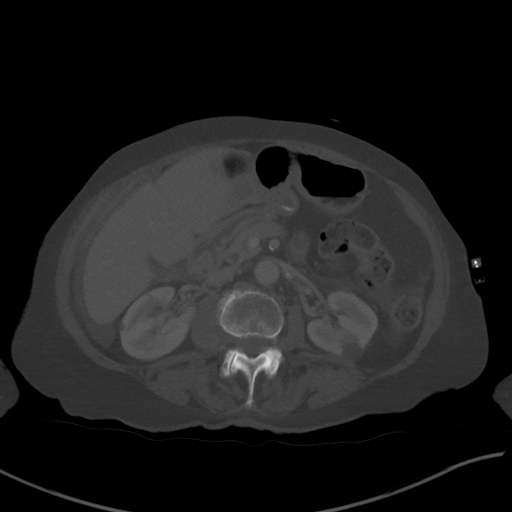
[im 63/90  soft-tissue]
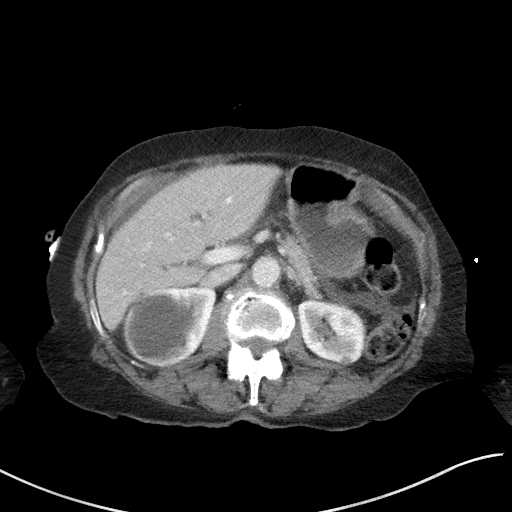
[im 69/90  soft-tissue]
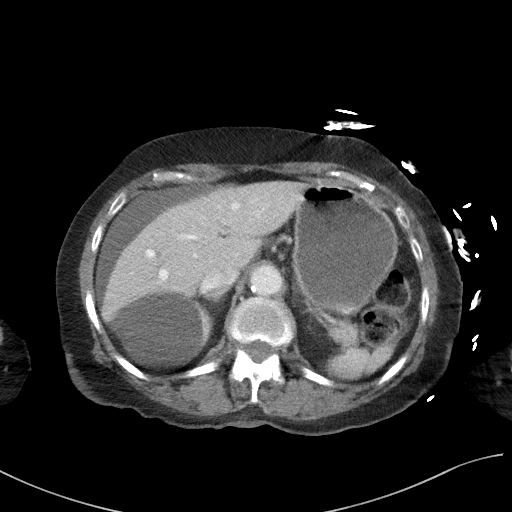
[im 79/90  soft-tissue]
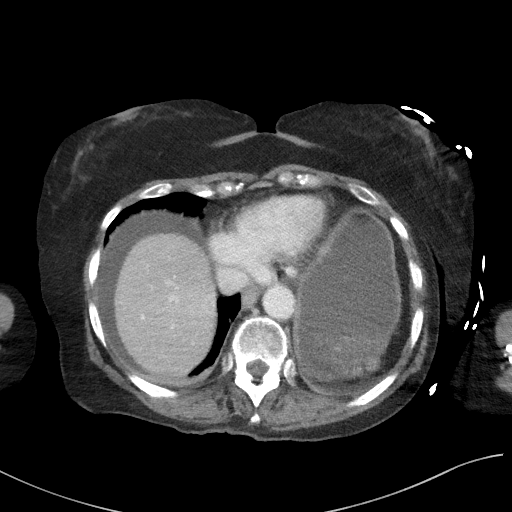
[im 84/90  soft-tissue]
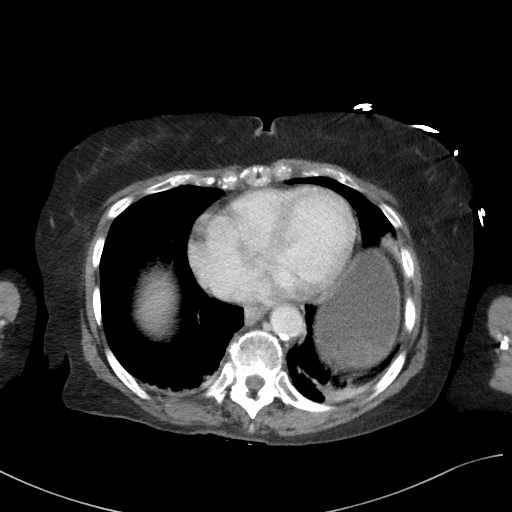

[Series 5: coronal st · coronal · 0.78mm/px · 3 of 92 slices shown]
[im 31/92  soft-tissue]
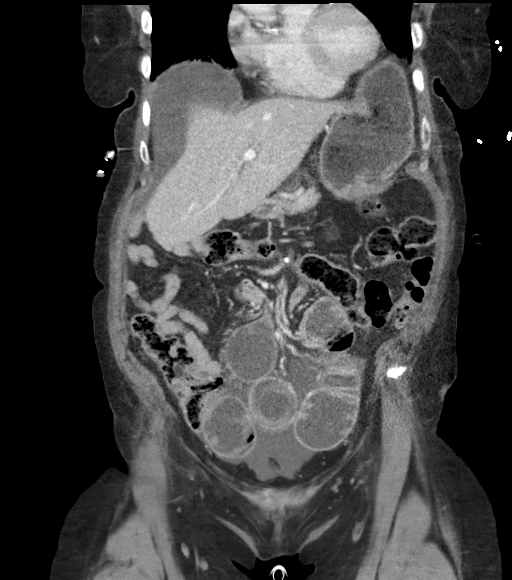
[im 41/92  soft-tissue]
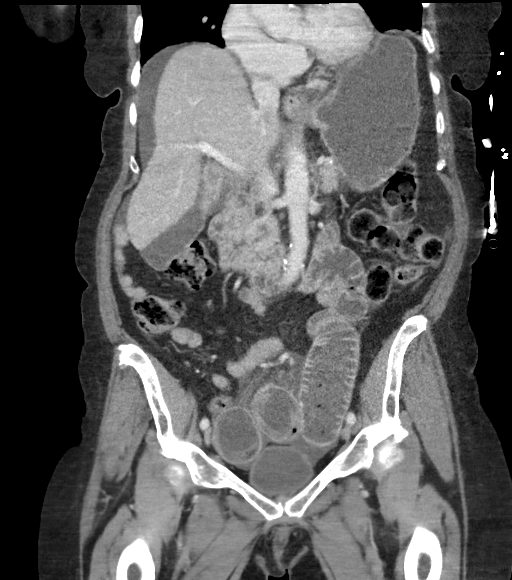
[im 51/92  soft-tissue]
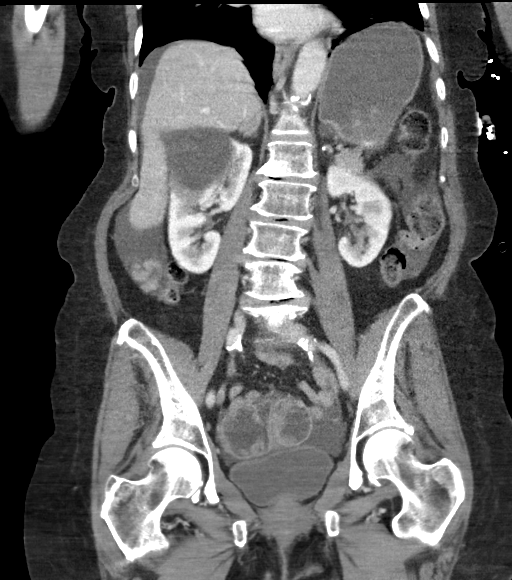

[16 of 46 positions shown; findings below may reference images not displayed]

FINDINGS: Lower chest: Hypoventilatory changes are seen at the lung bases.

Hepatobiliary: Stable 9 mm hypodensity right lobe liver likely a
small cyst or hemangioma. The remainder of the liver is
unremarkable. The gallbladder is normal. No biliary dilation.

Pancreas: Unremarkable. No pancreatic ductal dilatation or
surrounding inflammatory changes.

Spleen: Normal in size without focal abnormality.

Adrenals/Urinary Tract: Multiple bilateral renal cortical cysts are
seen, largest in the upper pole right kidney measuring 6.3 cm. No
hydronephrosis or nephrolithiasis. Bladder is unremarkable.

Stomach/Bowel: Dilated segment of jejunum within the mid abdomen is
seen, measuring up to 4 cm in diameter. There is twisting of the
central mesentery, compatible with closed loop obstruction and
midgut volvulus. No evidence of bowel wall ischemia or pneumatosis.

Normal appendix right lower quadrant. Postsurgical changes from
distal colectomy, with left lower quadrant colostomy.

Vascular/Lymphatic: Aortic atherosclerosis. No enlarged abdominal or
pelvic lymph nodes.

Reproductive: Uterus and bilateral adnexa are unremarkable.

Other: Small volume ascites within the right upper quadrant and
lower pelvis. No free intraperitoneal gas. There is a parastomal
hernia within the left lower quadrant containing fat and a portion
of colon.

Musculoskeletal: No acute or destructive bony lesions. Reconstructed
images demonstrate no additional findings.
IMPRESSION: 1. Closed loop small-bowel obstruction involving the mid jejunum,
with twisting of the mesenteric vessels consistent with midgut
volvulus. No evidence of bowel wall ischemia at this time. Surgical
consultation recommended.
2. Small volume ascites.
3. Distal colectomy, with left lower quadrant colostomy and small
parastomal hernia.
4.  Aortic Atherosclerosis ([id number]).

These results were called by telephone at the time of interpretation
on 08/09/2020 at [DATE] to provider KAHARA PASLEY , who verbally
acknowledged these results.

## 2022-03-23 DIAGNOSIS — J449 Chronic obstructive pulmonary disease, unspecified: Secondary | ICD-10-CM | POA: Diagnosis not present

## 2022-03-23 DIAGNOSIS — I272 Pulmonary hypertension, unspecified: Secondary | ICD-10-CM | POA: Diagnosis not present

## 2022-04-08 DIAGNOSIS — Z933 Colostomy status: Secondary | ICD-10-CM | POA: Diagnosis not present

## 2022-04-08 DIAGNOSIS — K56609 Unspecified intestinal obstruction, unspecified as to partial versus complete obstruction: Secondary | ICD-10-CM | POA: Diagnosis not present

## 2022-04-22 DIAGNOSIS — E039 Hypothyroidism, unspecified: Secondary | ICD-10-CM | POA: Diagnosis not present

## 2022-04-22 DIAGNOSIS — I10 Essential (primary) hypertension: Secondary | ICD-10-CM | POA: Diagnosis not present

## 2022-04-23 DIAGNOSIS — I272 Pulmonary hypertension, unspecified: Secondary | ICD-10-CM | POA: Diagnosis not present

## 2022-04-23 DIAGNOSIS — J449 Chronic obstructive pulmonary disease, unspecified: Secondary | ICD-10-CM | POA: Diagnosis not present

## 2022-05-07 DIAGNOSIS — K56609 Unspecified intestinal obstruction, unspecified as to partial versus complete obstruction: Secondary | ICD-10-CM | POA: Diagnosis not present

## 2022-05-07 DIAGNOSIS — Z933 Colostomy status: Secondary | ICD-10-CM | POA: Diagnosis not present

## 2022-05-12 DIAGNOSIS — Z0000 Encounter for general adult medical examination without abnormal findings: Secondary | ICD-10-CM | POA: Diagnosis not present

## 2022-05-12 DIAGNOSIS — I10 Essential (primary) hypertension: Secondary | ICD-10-CM | POA: Diagnosis not present

## 2022-05-12 DIAGNOSIS — F17210 Nicotine dependence, cigarettes, uncomplicated: Secondary | ICD-10-CM | POA: Diagnosis not present

## 2022-05-12 DIAGNOSIS — Z933 Colostomy status: Secondary | ICD-10-CM | POA: Diagnosis not present

## 2022-05-12 DIAGNOSIS — Z939 Artificial opening status, unspecified: Secondary | ICD-10-CM | POA: Diagnosis not present

## 2022-05-12 DIAGNOSIS — Z720 Tobacco use: Secondary | ICD-10-CM | POA: Diagnosis not present

## 2022-05-12 DIAGNOSIS — E039 Hypothyroidism, unspecified: Secondary | ICD-10-CM | POA: Diagnosis not present

## 2022-05-23 DIAGNOSIS — J449 Chronic obstructive pulmonary disease, unspecified: Secondary | ICD-10-CM | POA: Diagnosis not present

## 2022-05-23 DIAGNOSIS — I272 Pulmonary hypertension, unspecified: Secondary | ICD-10-CM | POA: Diagnosis not present

## 2022-06-05 DIAGNOSIS — K56609 Unspecified intestinal obstruction, unspecified as to partial versus complete obstruction: Secondary | ICD-10-CM | POA: Diagnosis not present

## 2022-06-05 DIAGNOSIS — Z933 Colostomy status: Secondary | ICD-10-CM | POA: Diagnosis not present

## 2022-06-23 DIAGNOSIS — J449 Chronic obstructive pulmonary disease, unspecified: Secondary | ICD-10-CM | POA: Diagnosis not present

## 2022-06-23 DIAGNOSIS — I272 Pulmonary hypertension, unspecified: Secondary | ICD-10-CM | POA: Diagnosis not present

## 2022-06-30 DIAGNOSIS — H401192 Primary open-angle glaucoma, unspecified eye, moderate stage: Secondary | ICD-10-CM | POA: Diagnosis not present

## 2022-07-09 DIAGNOSIS — Z933 Colostomy status: Secondary | ICD-10-CM | POA: Diagnosis not present

## 2022-07-09 DIAGNOSIS — K56609 Unspecified intestinal obstruction, unspecified as to partial versus complete obstruction: Secondary | ICD-10-CM | POA: Diagnosis not present

## 2022-07-24 DIAGNOSIS — J449 Chronic obstructive pulmonary disease, unspecified: Secondary | ICD-10-CM | POA: Diagnosis not present

## 2022-07-24 DIAGNOSIS — I272 Pulmonary hypertension, unspecified: Secondary | ICD-10-CM | POA: Diagnosis not present

## 2022-07-31 ENCOUNTER — Encounter: Payer: Self-pay | Admitting: *Deleted

## 2022-07-31 ENCOUNTER — Ambulatory Visit: Payer: Medicare Other | Attending: Cardiology | Admitting: Cardiology

## 2022-07-31 ENCOUNTER — Encounter: Payer: Self-pay | Admitting: Cardiology

## 2022-07-31 VITALS — BP 130/74 | HR 80 | Ht 61.0 in | Wt 140.2 lb

## 2022-07-31 DIAGNOSIS — I700 Atherosclerosis of aorta: Secondary | ICD-10-CM | POA: Diagnosis not present

## 2022-07-31 DIAGNOSIS — I38 Endocarditis, valve unspecified: Secondary | ICD-10-CM | POA: Diagnosis not present

## 2022-07-31 NOTE — Progress Notes (Signed)
Cardiology Office Note  Date: 07/31/2022   ID: Michelle Wall, DOB [birth year], MRN [id number]  PCP:  Mirna Mires, MD  Cardiologist:  Nona Dell, MD Electrophysiologist:  None   Chief Complaint  Patient presents with   Cardiac follow-up    History of Present Illness: Michelle Wall is an 88 y.o. female last seen in February 2022.  She is here for a routine visit.  Reports no major change in status since last encounter.  She still lives in her own home and is functional with ADLs.  Does have help with outside chores.  She reports NYHA class II dyspnea, no palpitations or syncope.  Echocardiogram from March 2022 revealed LVEF 65 to 70%, normal RV contraction with mildly elevated RVSP, mild to moderate tricuspid regurgitation, and mild to moderate aortic regurgitation.  I personally reviewed her ECG today which shows sinus rhythm with left anterior fascicular block and nonspecific T wave changes.  Requesting interval lab work from Dr. Loleta Chance.  I reviewed her medications.  Past Medical History:  Diagnosis Date   Aortic atherosclerosis (HCC)    Carotid atherosclerosis    Cor pulmonale (HCC)    Coronary artery calcification seen on CT scan    Emphysema    Essential hypertension    Hypothyroidism    Pulmonary hypertension (HCC)    Tricuspid insufficiency     Past Surgical History:  Procedure Laterality Date   COLONOSCOPY N/A 12/14/2018   Procedure: COLONOSCOPY;  Surgeon: Malissa Hippo, MD;  [facility];  Service: Endoscopy;  Laterality: N/A;  1:00   COLOSTOMY Left 06/14/2018   Procedure: COLOSTOMY;  Surgeon: Lucretia Roers, MD;  Location: [facility];  Service: General;  Laterality: Left;   LAPAROTOMY N/A 08/09/2020   Procedure: EXPLORATORY LAPAROTOMY;  Surgeon: Lucretia Roers, MD;  Location: [facility];  Service: General;  Laterality: N/A;   LYSIS OF ADHESION N/A 08/09/2020   Procedure: LYSIS OF ADHESIONS;  Surgeon: Lucretia Roers, MD;  Location: [facility];  Service: General;  Laterality: N/A;   PARTIAL COLECTOMY N/A 06/14/2018   Procedure: PARTIAL COLECTOMY;  Surgeon: Lucretia Roers, MD;  Location: [facility];  Service: General;  Laterality: N/A;   POLYPECTOMY  12/14/2018   Procedure: POLYPECTOMY;  Surgeon: Malissa Hippo, MD;  [facility];  Service: Endoscopy;;  rectum   THYROIDECTOMY, PARTIAL     TUBAL LIGATION     midline scar ? possibly tubal ligation    Current Outpatient Medications  Medication Sig Dispense Refill   aspirin EC 81 MG tablet Take 1 tablet (81 mg total) by mouth daily with breakfast. 30 tablet 11   bumetanide (BUMEX) 0.5 MG tablet Take 0.5 mg by mouth daily.     cholecalciferol (VITAMIN D3) 25 MCG (1000 UT) tablet Take 1,000 Units by mouth daily.     docusate sodium (COLACE) 100 MG capsule Take 1 capsule (100 mg total) by mouth 2 (two) times daily. 60 capsule 2   latanoprost (XALATAN) 0.005 % ophthalmic solution Place 1 drop into both eyes at bedtime.     ondansetron (ZOFRAN) 4 MG tablet Take 1 tablet (4 mg total) by mouth every 6 (six) hours as needed for nausea. 20 tablet 0   pantoprazole (PROTONIX) 40 MG tablet Take 1 tablet (40 mg total) by mouth daily at 12 noon. 30 tablet 2   potassium chloride SA (KLOR-CON) 20 MEQ tablet Take 20 mEq by mouth daily.     SYNTHROID 100  MCG tablet Take 100 mcg by mouth daily before breakfast.   3   tiotropium (SPIRIVA HANDIHALER) 18 MCG inhalation capsule Place 1 capsule (18 mcg total) into inhaler and inhale daily. 30 capsule 2   verapamil (CALAN-SR) 240 MG CR tablet Take 240 mg by mouth daily.      No current facility-administered medications for this visit.   Allergies:  Patient has no known allergies.   ROS: No palpitations or syncope.  Physical Exam: VS:  BP 130/74 (BP Location: Left Arm, Patient Position: Sitting, Cuff Size: Normal)   Pulse 80   Ht 5\' 1"  (1.549 m)   Wt 140 lb 3.2 oz (63.6 kg)   SpO2 95%   BMI 26.49 kg/m , BMI Body mass index is 26.49  kg/m.  Wt Readings from Last 3 Encounters:  07/31/22 140 lb 3.2 oz (63.6 kg)  01/20/21 128 lb 12.8 oz (58.4 kg)  09/26/20 123 lb (55.8 kg)    General: Patient appears comfortable at rest. HEENT: Conjunctiva and lids normal. Neck: Supple, no elevated JVP or carotid bruits. Lungs: Clear to auscultation, nonlabored breathing at rest. Cardiac: Regular rate and rhythm, no S3, 2/6 systolic murmur, 1/6 diastolic murmur, no pericardial rub. Extremities: No pitting edema.  ECG:  An ECG dated 08/11/2020 was personally reviewed today and demonstrated:  Sinus rhythm with PACs and PVCs.  Recent Labwork:  January 2022: TSH 0.36, BUN 18, creatinine 0.86, potassium 4.6, AST 14, ALT 7, hemoglobin 12.8, platelets 217  Other Studies Reviewed Today:  Echocardiogram 02/05/2021:  1. Left ventricular ejection fraction, by estimation, is 65 to 70%. The  left ventricle has normal function. The left ventricle has no regional  wall motion abnormalities. Left ventricular diastolic parameters are  indeterminate.   2. Right ventricular systolic function is normal. The right ventricular  size is normal. There is mildly elevated pulmonary artery systolic  pressure.   3. The mitral valve is normal in structure. Trivial mitral valve  regurgitation.   4. Tricuspid valve regurgitation is mild to moderate.   5. The aortic valve is tricuspid. Aortic valve regurgitation is mild to  moderate. Mild aortic valve sclerosis is present, with no evidence of  aortic valve stenosis.   6. The inferior vena cava is dilated in size with <50% respiratory  variability, suggesting right atrial pressure of 15 mmHg.   Assessment and Plan:  1.  Valvular heart disease with mild to moderate aortic and tricuspid regurgitation documented by echocardiogram in March 2022.  No substantial change in cardiac murmur or functional status.  We will continue to follow this over time.  2.  Prior history of cor pulmonale, only mildly elevated  estimated PASP by last echocardiogram however.  She does not report any worsening leg swelling and remains on Bumex with potassium supplement.  Requesting interval lab work from PCP for review.  3.  Coronary and aortic calcification evident by CT imaging.  She is on aspirin at this time.  Low-dose statin would be a consideration as well.  She follows with Dr. April 2022.  Medication Adjustments/Labs and Tests Ordered: Current medicines are reviewed at length with the patient today.  Concerns regarding medicines are outlined above.   Tests Ordered: Orders Placed This Encounter  Procedures   EKG 12-Lead    Medication Changes: No orders of the defined types were placed in this encounter.   Disposition:  Follow up  1 year.  Signed, Loleta Chance, MD, [facility] 07/31/2022 2:06 PM    [facility] at [place], [place], [place] [address] Phone: [phone]; Fax: [phone]

## 2022-07-31 NOTE — Patient Instructions (Signed)

## 2022-08-06 ENCOUNTER — Telehealth: Payer: Self-pay

## 2022-08-06 NOTE — Patient Outreach (Signed)
  Care Coordination   08/06/2022 Name: Michelle Wall MRN: [id number] DOB: 07-Mar-1933   Care Coordination Outreach Attempts:  An unsuccessful telephone outreach was attempted today to offer the patient information about available care coordination services as a benefit of their health plan.   Follow Up Plan:  Additional outreach attempts will be made to offer the patient care coordination information and services.   Encounter Outcome:  No Answer  Care Coordination Interventions Activated:  No   Care Coordination Interventions:  No, not indicated     Antionette Fairy, RN,BSN,CCM [facility] Care Management Telephonic Care Management Coordinator Direct Phone: [phone] Toll Free: [phone] Fax: [phone]

## 2022-08-07 DIAGNOSIS — K56609 Unspecified intestinal obstruction, unspecified as to partial versus complete obstruction: Secondary | ICD-10-CM | POA: Diagnosis not present

## 2022-08-07 DIAGNOSIS — Z933 Colostomy status: Secondary | ICD-10-CM | POA: Diagnosis not present

## 2022-08-12 ENCOUNTER — Telehealth: Payer: Self-pay

## 2022-08-12 NOTE — Patient Outreach (Signed)
  Care Coordination   08/12/2022 Name: YOANA STAIB MRN: [id number] DOB: 06/11/1933   Care Coordination Outreach Attempts:  A second unsuccessful outreach was attempted today to offer the patient with information about available care coordination services as a benefit of their health plan.     Follow Up Plan:  Additional outreach attempts will be made to offer the patient care coordination information and services.   Encounter Outcome:  No Answer  Care Coordination Interventions Activated:  No   Care Coordination Interventions:  No, not indicated     Enzo Montgomery, RN,BSN,CCM [place] [facility] Telephonic Care Management Coordinator Direct Phone: [phone] Toll Free: [phone] Fax: [phone]

## 2022-08-17 ENCOUNTER — Telehealth: Payer: Self-pay

## 2022-08-17 NOTE — Patient Outreach (Signed)
  Care Coordination   Initial Visit Note   08/17/2022 Name: Michelle Wall MRN: [id number] DOB: 09/16/1933  [name] is a 88 y.o. year old female who sees Iona Beard, MD for primary care. I spoke with  Darion Vira Blanco by phone today.  What matters to the patients health and wellness today?  Patient denies any issue or concerns. States things going well for her. Declined RN CM/THN services.     Goals Addressed             This Visit's Progress    COMPLETED: Care Coordination-no follow up required       Care Coordination Interventions: Advised patient to schedule annual wellness visit & immunizations Provided education to patient re: [facility] services Assessed social determinant of health barriers          SDOH assessments and interventions completed:  Yes  SDOH Interventions Today    Flowsheet Row Most Recent Value  SDOH Interventions   Food Insecurity Interventions Intervention Not Indicated  Transportation Interventions Intervention Not Indicated        Care Coordination Interventions Activated:  Yes  Care Coordination Interventions:  Yes, provided   Follow up plan: No further intervention required.   Encounter Outcome:  Pt. Visit Completed    Enzo Montgomery, RN,BSN,CCM [place] [facility] Telephonic Care Management Coordinator Direct Phone: [phone] Toll Free: [phone] Fax: [phone]

## 2022-08-22 DIAGNOSIS — E039 Hypothyroidism, unspecified: Secondary | ICD-10-CM | POA: Diagnosis not present

## 2022-08-22 DIAGNOSIS — I10 Essential (primary) hypertension: Secondary | ICD-10-CM | POA: Diagnosis not present

## 2022-08-22 DIAGNOSIS — E785 Hyperlipidemia, unspecified: Secondary | ICD-10-CM | POA: Diagnosis not present

## 2022-08-23 DIAGNOSIS — I272 Pulmonary hypertension, unspecified: Secondary | ICD-10-CM | POA: Diagnosis not present

## 2022-08-23 DIAGNOSIS — J449 Chronic obstructive pulmonary disease, unspecified: Secondary | ICD-10-CM | POA: Diagnosis not present

## 2022-09-08 DIAGNOSIS — E785 Hyperlipidemia, unspecified: Secondary | ICD-10-CM | POA: Diagnosis not present

## 2022-09-08 DIAGNOSIS — Z933 Colostomy status: Secondary | ICD-10-CM | POA: Diagnosis not present

## 2022-09-08 DIAGNOSIS — I10 Essential (primary) hypertension: Secondary | ICD-10-CM | POA: Diagnosis not present

## 2022-09-08 DIAGNOSIS — Z23 Encounter for immunization: Secondary | ICD-10-CM | POA: Diagnosis not present

## 2022-09-08 DIAGNOSIS — Z939 Artificial opening status, unspecified: Secondary | ICD-10-CM | POA: Diagnosis not present

## 2022-09-08 DIAGNOSIS — K56609 Unspecified intestinal obstruction, unspecified as to partial versus complete obstruction: Secondary | ICD-10-CM | POA: Diagnosis not present

## 2022-09-08 DIAGNOSIS — E039 Hypothyroidism, unspecified: Secondary | ICD-10-CM | POA: Diagnosis not present

## 2022-09-08 DIAGNOSIS — F17210 Nicotine dependence, cigarettes, uncomplicated: Secondary | ICD-10-CM | POA: Diagnosis not present

## 2022-09-23 DIAGNOSIS — I272 Pulmonary hypertension, unspecified: Secondary | ICD-10-CM | POA: Diagnosis not present

## 2022-09-23 DIAGNOSIS — J449 Chronic obstructive pulmonary disease, unspecified: Secondary | ICD-10-CM | POA: Diagnosis not present

## 2022-10-08 DIAGNOSIS — K56609 Unspecified intestinal obstruction, unspecified as to partial versus complete obstruction: Secondary | ICD-10-CM | POA: Diagnosis not present

## 2022-10-08 DIAGNOSIS — Z933 Colostomy status: Secondary | ICD-10-CM | POA: Diagnosis not present

## 2022-10-23 DIAGNOSIS — I272 Pulmonary hypertension, unspecified: Secondary | ICD-10-CM | POA: Diagnosis not present

## 2022-10-23 DIAGNOSIS — J449 Chronic obstructive pulmonary disease, unspecified: Secondary | ICD-10-CM | POA: Diagnosis not present

## 2022-11-06 DIAGNOSIS — Z933 Colostomy status: Secondary | ICD-10-CM | POA: Diagnosis not present

## 2022-11-06 DIAGNOSIS — K56609 Unspecified intestinal obstruction, unspecified as to partial versus complete obstruction: Secondary | ICD-10-CM | POA: Diagnosis not present

## 2022-11-23 DIAGNOSIS — J449 Chronic obstructive pulmonary disease, unspecified: Secondary | ICD-10-CM | POA: Diagnosis not present

## 2022-11-23 DIAGNOSIS — I272 Pulmonary hypertension, unspecified: Secondary | ICD-10-CM | POA: Diagnosis not present

## 2022-12-09 DIAGNOSIS — Z933 Colostomy status: Secondary | ICD-10-CM | POA: Diagnosis not present

## 2022-12-09 DIAGNOSIS — K56609 Unspecified intestinal obstruction, unspecified as to partial versus complete obstruction: Secondary | ICD-10-CM | POA: Diagnosis not present

## 2022-12-24 DIAGNOSIS — J449 Chronic obstructive pulmonary disease, unspecified: Secondary | ICD-10-CM | POA: Diagnosis not present

## 2022-12-24 DIAGNOSIS — I272 Pulmonary hypertension, unspecified: Secondary | ICD-10-CM | POA: Diagnosis not present

## 2023-01-04 DIAGNOSIS — H401132 Primary open-angle glaucoma, bilateral, moderate stage: Secondary | ICD-10-CM | POA: Diagnosis not present

## 2023-01-07 DIAGNOSIS — K56609 Unspecified intestinal obstruction, unspecified as to partial versus complete obstruction: Secondary | ICD-10-CM | POA: Diagnosis not present

## 2023-01-07 DIAGNOSIS — Z933 Colostomy status: Secondary | ICD-10-CM | POA: Diagnosis not present

## 2023-01-12 DIAGNOSIS — E7800 Pure hypercholesterolemia, unspecified: Secondary | ICD-10-CM | POA: Diagnosis not present

## 2023-01-12 DIAGNOSIS — F17210 Nicotine dependence, cigarettes, uncomplicated: Secondary | ICD-10-CM | POA: Diagnosis not present

## 2023-01-12 DIAGNOSIS — E039 Hypothyroidism, unspecified: Secondary | ICD-10-CM | POA: Diagnosis not present

## 2023-01-12 DIAGNOSIS — D229 Melanocytic nevi, unspecified: Secondary | ICD-10-CM | POA: Diagnosis not present

## 2023-01-12 DIAGNOSIS — Z933 Colostomy status: Secondary | ICD-10-CM | POA: Diagnosis not present

## 2023-01-12 DIAGNOSIS — I10 Essential (primary) hypertension: Secondary | ICD-10-CM | POA: Diagnosis not present

## 2023-01-22 DIAGNOSIS — I272 Pulmonary hypertension, unspecified: Secondary | ICD-10-CM | POA: Diagnosis not present

## 2023-01-22 DIAGNOSIS — J449 Chronic obstructive pulmonary disease, unspecified: Secondary | ICD-10-CM | POA: Diagnosis not present

## 2023-02-05 DIAGNOSIS — K56609 Unspecified intestinal obstruction, unspecified as to partial versus complete obstruction: Secondary | ICD-10-CM | POA: Diagnosis not present

## 2023-02-05 DIAGNOSIS — Z933 Colostomy status: Secondary | ICD-10-CM | POA: Diagnosis not present

## 2023-02-08 DIAGNOSIS — L820 Inflamed seborrheic keratosis: Secondary | ICD-10-CM | POA: Diagnosis not present

## 2023-02-09 DIAGNOSIS — I10 Essential (primary) hypertension: Secondary | ICD-10-CM | POA: Diagnosis not present

## 2023-02-22 DIAGNOSIS — I272 Pulmonary hypertension, unspecified: Secondary | ICD-10-CM | POA: Diagnosis not present

## 2023-02-22 DIAGNOSIS — J449 Chronic obstructive pulmonary disease, unspecified: Secondary | ICD-10-CM | POA: Diagnosis not present

## 2023-03-09 DIAGNOSIS — R0902 Hypoxemia: Secondary | ICD-10-CM | POA: Diagnosis not present

## 2023-03-09 DIAGNOSIS — I10 Essential (primary) hypertension: Secondary | ICD-10-CM | POA: Diagnosis not present

## 2023-03-09 DIAGNOSIS — Z720 Tobacco use: Secondary | ICD-10-CM | POA: Diagnosis not present

## 2023-03-09 DIAGNOSIS — J209 Acute bronchitis, unspecified: Secondary | ICD-10-CM | POA: Diagnosis not present

## 2023-03-10 DIAGNOSIS — Z933 Colostomy status: Secondary | ICD-10-CM | POA: Diagnosis not present

## 2023-03-10 DIAGNOSIS — K56609 Unspecified intestinal obstruction, unspecified as to partial versus complete obstruction: Secondary | ICD-10-CM | POA: Diagnosis not present

## 2023-03-12 DIAGNOSIS — I10 Essential (primary) hypertension: Secondary | ICD-10-CM | POA: Diagnosis not present

## 2023-03-12 DIAGNOSIS — E049 Nontoxic goiter, unspecified: Secondary | ICD-10-CM | POA: Diagnosis not present

## 2023-03-12 DIAGNOSIS — E063 Autoimmune thyroiditis: Secondary | ICD-10-CM | POA: Diagnosis not present

## 2023-03-12 DIAGNOSIS — N270 Small kidney, unilateral: Secondary | ICD-10-CM | POA: Diagnosis not present

## 2023-03-12 DIAGNOSIS — E785 Hyperlipidemia, unspecified: Secondary | ICD-10-CM | POA: Diagnosis not present

## 2023-03-12 LAB — TSH: TSH: 0.4 — AB (ref 0.41–5.90)

## 2023-03-16 DIAGNOSIS — J209 Acute bronchitis, unspecified: Secondary | ICD-10-CM | POA: Diagnosis not present

## 2023-03-16 DIAGNOSIS — Z720 Tobacco use: Secondary | ICD-10-CM | POA: Diagnosis not present

## 2023-03-16 DIAGNOSIS — I10 Essential (primary) hypertension: Secondary | ICD-10-CM | POA: Diagnosis not present

## 2023-03-19 DIAGNOSIS — E063 Autoimmune thyroiditis: Secondary | ICD-10-CM | POA: Diagnosis not present

## 2023-03-19 DIAGNOSIS — F17210 Nicotine dependence, cigarettes, uncomplicated: Secondary | ICD-10-CM | POA: Diagnosis not present

## 2023-03-19 DIAGNOSIS — E042 Nontoxic multinodular goiter: Secondary | ICD-10-CM | POA: Diagnosis not present

## 2023-03-19 DIAGNOSIS — E785 Hyperlipidemia, unspecified: Secondary | ICD-10-CM | POA: Diagnosis not present

## 2023-03-19 DIAGNOSIS — F17200 Nicotine dependence, unspecified, uncomplicated: Secondary | ICD-10-CM | POA: Diagnosis not present

## 2023-03-19 DIAGNOSIS — E890 Postprocedural hypothyroidism: Secondary | ICD-10-CM | POA: Diagnosis not present

## 2023-03-19 DIAGNOSIS — I10 Essential (primary) hypertension: Secondary | ICD-10-CM | POA: Diagnosis not present

## 2023-03-24 DIAGNOSIS — J449 Chronic obstructive pulmonary disease, unspecified: Secondary | ICD-10-CM | POA: Diagnosis not present

## 2023-03-24 DIAGNOSIS — I272 Pulmonary hypertension, unspecified: Secondary | ICD-10-CM | POA: Diagnosis not present

## 2023-04-08 DIAGNOSIS — K56609 Unspecified intestinal obstruction, unspecified as to partial versus complete obstruction: Secondary | ICD-10-CM | POA: Diagnosis not present

## 2023-04-08 DIAGNOSIS — Z933 Colostomy status: Secondary | ICD-10-CM | POA: Diagnosis not present

## 2023-04-13 DIAGNOSIS — H401132 Primary open-angle glaucoma, bilateral, moderate stage: Secondary | ICD-10-CM | POA: Diagnosis not present

## 2023-04-24 DIAGNOSIS — J449 Chronic obstructive pulmonary disease, unspecified: Secondary | ICD-10-CM | POA: Diagnosis not present

## 2023-04-24 DIAGNOSIS — I272 Pulmonary hypertension, unspecified: Secondary | ICD-10-CM | POA: Diagnosis not present

## 2023-05-07 DIAGNOSIS — Z933 Colostomy status: Secondary | ICD-10-CM | POA: Diagnosis not present

## 2023-05-07 DIAGNOSIS — K56609 Unspecified intestinal obstruction, unspecified as to partial versus complete obstruction: Secondary | ICD-10-CM | POA: Diagnosis not present

## 2023-05-17 DIAGNOSIS — I10 Essential (primary) hypertension: Secondary | ICD-10-CM | POA: Diagnosis not present

## 2023-05-24 DIAGNOSIS — J449 Chronic obstructive pulmonary disease, unspecified: Secondary | ICD-10-CM | POA: Diagnosis not present

## 2023-05-24 DIAGNOSIS — I272 Pulmonary hypertension, unspecified: Secondary | ICD-10-CM | POA: Diagnosis not present

## 2023-06-09 DIAGNOSIS — Z933 Colostomy status: Secondary | ICD-10-CM | POA: Diagnosis not present

## 2023-06-09 DIAGNOSIS — K56609 Unspecified intestinal obstruction, unspecified as to partial versus complete obstruction: Secondary | ICD-10-CM | POA: Diagnosis not present

## 2023-06-15 DIAGNOSIS — E7800 Pure hypercholesterolemia, unspecified: Secondary | ICD-10-CM | POA: Diagnosis not present

## 2023-06-15 DIAGNOSIS — E039 Hypothyroidism, unspecified: Secondary | ICD-10-CM | POA: Diagnosis not present

## 2023-06-15 DIAGNOSIS — E890 Postprocedural hypothyroidism: Secondary | ICD-10-CM | POA: Diagnosis not present

## 2023-06-15 DIAGNOSIS — Z720 Tobacco use: Secondary | ICD-10-CM | POA: Diagnosis not present

## 2023-06-15 DIAGNOSIS — Z6824 Body mass index (BMI) 24.0-24.9, adult: Secondary | ICD-10-CM | POA: Diagnosis not present

## 2023-06-15 DIAGNOSIS — I10 Essential (primary) hypertension: Secondary | ICD-10-CM | POA: Diagnosis not present

## 2023-06-24 DIAGNOSIS — I272 Pulmonary hypertension, unspecified: Secondary | ICD-10-CM | POA: Diagnosis not present

## 2023-06-24 DIAGNOSIS — J449 Chronic obstructive pulmonary disease, unspecified: Secondary | ICD-10-CM | POA: Diagnosis not present

## 2023-07-09 DIAGNOSIS — Z933 Colostomy status: Secondary | ICD-10-CM | POA: Diagnosis not present

## 2023-07-09 DIAGNOSIS — K56609 Unspecified intestinal obstruction, unspecified as to partial versus complete obstruction: Secondary | ICD-10-CM | POA: Diagnosis not present

## 2023-07-19 DIAGNOSIS — R600 Localized edema: Secondary | ICD-10-CM | POA: Diagnosis not present

## 2023-07-19 DIAGNOSIS — I10 Essential (primary) hypertension: Secondary | ICD-10-CM | POA: Diagnosis not present

## 2023-07-19 DIAGNOSIS — R059 Cough, unspecified: Secondary | ICD-10-CM | POA: Diagnosis not present

## 2023-07-25 DIAGNOSIS — J449 Chronic obstructive pulmonary disease, unspecified: Secondary | ICD-10-CM | POA: Diagnosis not present

## 2023-07-25 DIAGNOSIS — I272 Pulmonary hypertension, unspecified: Secondary | ICD-10-CM | POA: Diagnosis not present

## 2023-08-10 DIAGNOSIS — Z933 Colostomy status: Secondary | ICD-10-CM | POA: Diagnosis not present

## 2023-08-10 DIAGNOSIS — K56609 Unspecified intestinal obstruction, unspecified as to partial versus complete obstruction: Secondary | ICD-10-CM | POA: Diagnosis not present

## 2023-08-24 DIAGNOSIS — I272 Pulmonary hypertension, unspecified: Secondary | ICD-10-CM | POA: Diagnosis not present

## 2023-08-24 DIAGNOSIS — J449 Chronic obstructive pulmonary disease, unspecified: Secondary | ICD-10-CM | POA: Diagnosis not present

## 2023-08-24 NOTE — Patient Instructions (Signed)

## 2023-08-25 ENCOUNTER — Ambulatory Visit (INDEPENDENT_AMBULATORY_CARE_PROVIDER_SITE_OTHER): Payer: 59 | Admitting: Nurse Practitioner

## 2023-08-25 ENCOUNTER — Encounter: Payer: Self-pay | Admitting: Nurse Practitioner

## 2023-08-25 VITALS — BP 138/64 | HR 78 | Ht 61.0 in | Wt 127.4 lb

## 2023-08-25 DIAGNOSIS — E039 Hypothyroidism, unspecified: Secondary | ICD-10-CM

## 2023-08-25 NOTE — Progress Notes (Signed)
Endocrinology Consult Note                                         08/25/2023, 9:06 AM  Subjective:   Subjective    Michelle Wall is a 89 y.o.-year-old female patient being seen in consultation for hypothyroidism referred by Mirna Mires, MD.   Past Medical History:  Diagnosis Date   Aortic atherosclerosis (HCC)    Carotid atherosclerosis    Cor pulmonale (HCC)    Coronary artery calcification seen on CT scan    Emphysema    Essential hypertension    Hypothyroidism    Pulmonary hypertension (HCC)    Tricuspid insufficiency     Past Surgical History:  Procedure Laterality Date   COLONOSCOPY N/A 12/14/2018   Procedure: COLONOSCOPY;  Surgeon: Malissa Hippo, MD;  [facility];  Service: Endoscopy;  Laterality: N/A;  1:00   COLOSTOMY Left 06/14/2018   Procedure: COLOSTOMY;  Surgeon: Lucretia Roers, MD;  Location: [facility];  Service: General;  Laterality: Left;   LAPAROTOMY N/A 08/09/2020   Procedure: EXPLORATORY LAPAROTOMY;  Surgeon: Lucretia Roers, MD;  Location: [facility];  Service: General;  Laterality: N/A;   LYSIS OF ADHESION N/A 08/09/2020   Procedure: LYSIS OF ADHESIONS;  Surgeon: Lucretia Roers, MD;  Location: [facility];  Service: General;  Laterality: N/A;   PARTIAL COLECTOMY N/A 06/14/2018   Procedure: PARTIAL COLECTOMY;  Surgeon: Lucretia Roers, MD;  Location: [facility];  Service: General;  Laterality: N/A;   POLYPECTOMY  12/14/2018   Procedure: POLYPECTOMY;  Surgeon: Malissa Hippo, MD;  [facility];  Service: Endoscopy;;  rectum   THYROIDECTOMY, PARTIAL     TUBAL LIGATION     midline scar ? possibly tubal ligation    Social History   Socioeconomic History   Marital status: Widowed    Spouse name: Not on file   Number of children: 8   Years of education: Not on file   Highest education level: Not on file  Occupational History   Not on file  Tobacco Use    Smoking status: Every Day    Current packs/day: 0.00    Average packs/day: 0.8 packs/day for 60.0 years (45.0 ttl pk-yrs)    Types: Cigarettes    Start date: 08/08/1960    Last attempt to quit: 08/08/2020    Years since quitting: 3.0    Passive exposure: Never   Smokeless tobacco: Never  Vaping Use   Vaping status: Never Used  Substance and Sexual Activity   Alcohol use: No   Drug use: No   Sexual activity: Not on file  Other Topics Concern   Not on file  Social History Narrative   Not on file   Social Determinants of Health   Financial Resource Strain: Not on file  Food Insecurity: No Food Insecurity (08/17/2022)   Hunger Vital Sign    Worried About Running Out of Food in the Last Year: Never true  Ran Out of Food in the Last Year: Never true  Transportation Needs: No Transportation Needs (08/17/2022)   PRAPARE - Administrator, Civil Service (Medical): No    Lack of Transportation (Non-Medical): No  Physical Activity: Not on file  Stress: Not on file  Social Connections: Not on file    History reviewed. No pertinent family history.  Outpatient Encounter Medications as of 08/25/2023  Medication Sig   aspirin EC 81 MG tablet Take 1 tablet (81 mg total) by mouth daily with breakfast.   bumetanide (BUMEX) 0.5 MG tablet Take 0.5 mg by mouth daily.   cholecalciferol (VITAMIN D3) 25 MCG (1000 UT) tablet Take 1,000 Units by mouth daily.   docusate sodium (COLACE) 100 MG capsule Take 1 capsule (100 mg total) by mouth 2 (two) times daily.   latanoprost (XALATAN) 0.005 % ophthalmic solution Place 1 drop into both eyes at bedtime.   ondansetron (ZOFRAN) 4 MG tablet Take 1 tablet (4 mg total) by mouth every 6 (six) hours as needed for nausea.   pantoprazole (PROTONIX) 40 MG tablet Take 1 tablet (40 mg total) by mouth daily at 12 noon.   potassium chloride SA (KLOR-CON) 20 MEQ tablet Take 20 mEq by mouth daily.   SYNTHROID 100 MCG tablet Take 100 mcg by mouth daily before  breakfast.    verapamil (CALAN-SR) 240 MG CR tablet Take 240 mg by mouth daily.    tiotropium (SPIRIVA HANDIHALER) 18 MCG inhalation capsule Place 1 capsule (18 mcg total) into inhaler and inhale daily.   No facility-administered encounter medications on file as of 08/25/2023.    ALLERGIES: No Known Allergies VACCINATION STATUS: Immunization History  Administered Date(s) Administered   Fluad Quad(high Dose 65+) 08/12/2020   Pneumococcal Polysaccharide-23 06/16/2018     HPI   Michelle Wall  is a patient with the above medical history. she was diagnosed with hypothyroidism at approximate age of 5 years- she notes she had goiter and had partial thyroidectomy at that time (right side), which required subsequent initiation of thyroid hormone replacement therapy. she was given various doses of Levothyroxine over the years, currently on 100 micrograms. she reports compliance to this medication:  Taking it daily on empty stomach with water, separated by >30 minutes before breakfast and other medications, and by at least 4 hours from calcium, iron, PPIs, multivitamins .  She says she sometimes takes it with her other medications in the morning.  I reviewed patient's thyroid tests:  Lab Results  Component Value Date   TSH 0.40 (A) 03/12/2023   TSH 0.36 (A) 12/16/2020   TSH 0.777 08/08/2020     Patient denies any overt symptoms of over or under replacement.  Pt denies feeling nodules in neck, hoarseness, dysphagia/odynophagia, SOB with lying down.  she denies any known family history of thyroid disorders.  No family history of thyroid cancer.  She did have partial thyroidectomy for goiter in the past (right side). No history of radiation therapy to head or neck.  No recent use of iodine supplements.  Denies use of Biotin containing supplements.  I reviewed her chart and she also has a history of GERD, HTN, COPD, ASCVD, smoker.   ROS:  Constitutional: no weight gain/loss, no  fatigue, no subjective hyperthermia, no subjective hypothermia Eyes: no blurry vision, no xerophthalmia ENT: no sore throat, no nodules palpated in throat, no dysphagia/odynophagia, no hoarseness Cardiovascular: no chest pain, no SOB, no palpitations, no leg swelling Respiratory: no cough, no SOB Gastrointestinal:  no nausea/vomiting/diarrhea Musculoskeletal: no muscle/joint aches, walks with cane Skin: no rashes Neurological: no tremors, no numbness, no tingling, no dizziness Psychiatric: no depression, no anxiety   Objective:   Objective     BP 138/64 (BP Location: Left Arm, Patient Position: Sitting, Cuff Size: Large)   Pulse 78   Ht 5\' 1"  (1.549 m)   Wt 127 lb 6.4 oz (57.8 kg)   BMI 24.07 kg/m  Wt Readings from Last 3 Encounters:  08/25/23 127 lb 6.4 oz (57.8 kg)  07/31/22 140 lb 3.2 oz (63.6 kg)  01/20/21 128 lb 12.8 oz (58.4 kg)    BP Readings from Last 3 Encounters:  08/25/23 138/64  07/31/22 130/74  01/20/21 (!) 162/70     Constitutional:  Body mass index is 24.07 kg/m., not in acute distress, normal state of mind Eyes: PERRLA, EOMI, no exophthalmos ENT: moist mucous membranes, no thyromegaly, no cervical lymphadenopathy, faint surgical scar to right side of neck (from previous partial thyroidectomy) Cardiovascular: normal precordial activity, RRR, no murmur/rubs/gallops Respiratory:  adequate breathing efforts, no gross chest deformity, Clear to auscultation bilaterally Gastrointestinal: abdomen soft, non-tender, no distension, bowel sounds present Musculoskeletal: no gross deformities, strength intact in all four extremities, walks with cane Skin: moist, warm, no rashes Neurological: no tremor with outstretched hands, deep tendon reflexes normal in BLE.   CMP ( most recent) CMP     Component Value Date/Time   NA 143 08/19/2020 0550   K 4.2 08/19/2020 0550   CL 109 08/19/2020 0550   CO2 24 08/19/2020 0550   GLUCOSE 120 (H) 08/19/2020 0550   BUN 18  08/19/2020 0550   CREATININE 0.67 08/19/2020 0550   CALCIUM 8.6 (L) 08/19/2020 0550   PROT 6.2 (L) 08/19/2020 0550   ALBUMIN 2.5 (L) 08/19/2020 0550   AST 119 (H) 08/19/2020 0550   ALT 154 (H) 08/19/2020 0550   ALKPHOS 43 08/19/2020 0550   BILITOT 0.4 08/19/2020 0550   GFRNONAA >60 08/19/2020 0550     Diabetic Labs (most recent): Lab Results  Component Value Date   HGBA1C 5.2 08/08/2020     Lipid Panel ( most recent) Lipid Panel     Component Value Date/Time   TRIG 88 08/19/2020 0550       Lab Results  Component Value Date   TSH 0.40 (A) 03/12/2023   TSH 0.36 (A) 12/16/2020   TSH 0.777 08/08/2020      Assessment & Plan:   ASSESSMENT / PLAN:  1. Hypothyroidism-postsurgical?   Patient with long-standing hypothyroidism, on levothyroxine therapy. On physical exam, patient  does not have gross goiter, thyroid nodules, or neck compression symptoms.  She is advised to continue her Levothyroxine 100 mcg po daily before breakfast (slightly above her weight based maximum of 94 mcg).  - We discussed about correct intake of levothyroxine, at fasting, with water, separated by at least 30 minutes from breakfast, and separated by more than 4 hours from calcium, iron, multivitamins, acid reflux medications (PPIs). -Patient is made aware of the fact that thyroid hormone replacement is needed for life, dose to be adjusted by periodic monitoring of thyroid function tests.  - Will check thyroid tests before next visit: TSH, free T4, and antibodies to help classify her dysfunction.  -Due to absence of clinical goiter, no need for thyroid ultrasound.  - Time spent with the patient: 30 minutes, of which >50% was spent in obtaining information about her symptoms, reviewing her previous labs, evaluations, and treatments, counseling her about her hypothyroidism,  and developing a plan to confirm the diagnosis and long term treatment as necessary. Please refer to "Patient Self Inventory" in  the Media tab for reviewed elements of pertinent patient history.  [employer] participated in the discussions, expressed understanding, and voiced agreement with the above plans.  All questions were answered to her satisfaction. she is encouraged to contact clinic should she have any questions or concerns prior to her return visit.   FOLLOW UP PLAN:  Return in about 1 week (around 09/01/2023) for Thyroid follow up, Previsit labs.  Ronny Bacon, [facility] [address] [place], [place] [address] Phone: [phone] Fax: [phone]  08/25/2023, 9:06 AM

## 2023-08-26 LAB — THYROGLOBULIN ANTIBODY: Thyroglobulin Antibody: 190.7 [IU]/mL — ABNORMAL HIGH (ref 0.0–0.9)

## 2023-08-26 LAB — T4, FREE: Free T4: 2.47 ng/dL — ABNORMAL HIGH (ref 0.82–1.77)

## 2023-08-26 LAB — TSH: TSH: <0.005 u[IU]/mL — ABNORMAL LOW (ref 0.450–4.500)

## 2023-08-26 LAB — THYROID PEROXIDASE ANTIBODY: Thyroperoxidase Ab SerPl-aCnc: 19 [IU]/mL (ref 0–34)

## 2023-08-30 NOTE — Patient Instructions (Signed)

## 2023-09-01 ENCOUNTER — Encounter: Payer: Self-pay | Admitting: Nurse Practitioner

## 2023-09-01 ENCOUNTER — Ambulatory Visit (INDEPENDENT_AMBULATORY_CARE_PROVIDER_SITE_OTHER): Payer: 59 | Admitting: Nurse Practitioner

## 2023-09-01 VITALS — BP 102/60 | HR 72 | Ht 61.0 in | Wt 126.2 lb

## 2023-09-01 DIAGNOSIS — E039 Hypothyroidism, unspecified: Secondary | ICD-10-CM | POA: Diagnosis not present

## 2023-09-01 MED ORDER — LEVOTHYROXINE SODIUM 75 MCG PO TABS
75.0000 ug | ORAL_TABLET | Freq: Every day | ORAL | 0 refills | Status: DC
Start: 2023-09-01 — End: 2023-10-29

## 2023-09-01 NOTE — Progress Notes (Signed)
Endocrinology Follow Up Note                                         09/01/2023, 9:14 AM  Subjective:   Subjective    Amiee H Lio is a 89 y.o.-year-old female patient being seen in follow up after being seen in consultation for hypothyroidism referred by Mirna Mires, MD.   Past Medical History:  Diagnosis Date   Aortic atherosclerosis (HCC)    Carotid atherosclerosis    Cor pulmonale (HCC)    Coronary artery calcification seen on CT scan    Emphysema    Essential hypertension    Hypothyroidism    Pulmonary hypertension (HCC)    Tricuspid insufficiency     Past Surgical History:  Procedure Laterality Date   COLONOSCOPY N/A 12/14/2018   Procedure: COLONOSCOPY;  Surgeon: Malissa Hippo, MD;  [facility];  Service: Endoscopy;  Laterality: N/A;  1:00   COLOSTOMY Left 06/14/2018   Procedure: COLOSTOMY;  Surgeon: Lucretia Roers, MD;  Location[facility];  Service: General;  Laterality: Left;   LAPAROTOMY N/A 08/09/2020   Procedure: EXPLORATORY LAPAROTOMY;  Surgeon: Lucretia Roers, MD;  Location: [facility];  Service: General;  Laterality: N/A;   LYSIS OF ADHESION N/A 08/09/2020   Procedure: LYSIS OF ADHESIONS;  Surgeon: Lucretia Roers, MD;  Location: [facility];  Service: General;  Laterality: N/A;   PARTIAL COLECTOMY N/A 06/14/2018   Procedure: PARTIAL COLECTOMY;  Surgeon: Lucretia Roers, MD;  Location: [facility];  Service: General;  Laterality: N/A;   POLYPECTOMY  12/14/2018   Procedure: POLYPECTOMY;  Surgeon: Malissa Hippo, MD;  [facility];  Service: Endoscopy;;  rectum   THYROIDECTOMY, PARTIAL     TUBAL LIGATION     midline scar ? possibly tubal ligation    Social History   Socioeconomic History   Marital status: Widowed    Spouse name: Not on file   Number of children: 8   Years of education: Not on file   Highest education level: Not on file  Occupational History    Not on file  Tobacco Use   Smoking status: Every Day    Current packs/day: 0.00    Average packs/day: 0.8 packs/day for 60.0 years (45.0 ttl pk-yrs)    Types: Cigarettes    Start date: 08/08/1960    Last attempt to quit: 08/08/2020    Years since quitting: 3.0    Passive exposure: Never   Smokeless tobacco: Never  Vaping Use   Vaping status: Never Used  Substance and Sexual Activity   Alcohol use: No   Drug use: No   Sexual activity: Not on file  Other Topics Concern   Not on file  Social History Narrative   Not on file   Social Determinants of Health   Financial Resource Strain: Not on file  Food Insecurity: No Food Insecurity (08/17/2022)   Hunger Vital Sign    Worried About Running Out of Food in  the Last Year: Never true    Ran Out of Food in the Last Year: Never true  Transportation Needs: No Transportation Needs (08/17/2022)   PRAPARE - Administrator, Civil Service (Medical): No    Lack of Transportation (Non-Medical): No  Physical Activity: Not on file  Stress: Not on file  Social Connections: Not on file    History reviewed. No pertinent family history.  Outpatient Encounter Medications as of 09/01/2023  Medication Sig   aspirin EC 81 MG tablet Take 1 tablet (81 mg total) by mouth daily with breakfast.   bumetanide (BUMEX) 0.5 MG tablet Take 0.5 mg by mouth daily.   cholecalciferol (VITAMIN D3) 25 MCG (1000 UT) tablet Take 1,000 Units by mouth daily.   docusate sodium (COLACE) 100 MG capsule Take 1 capsule (100 mg total) by mouth 2 (two) times daily.   latanoprost (XALATAN) 0.005 % ophthalmic solution Place 1 drop into both eyes at bedtime.   levothyroxine (SYNTHROID) 75 MCG tablet Take 1 tablet (75 mcg total) by mouth daily.   ondansetron (ZOFRAN) 4 MG tablet Take 1 tablet (4 mg total) by mouth every 6 (six) hours as needed for nausea.   pantoprazole (PROTONIX) 40 MG tablet Take 1 tablet (40 mg total) by mouth daily at 12 noon.   potassium chloride SA  (KLOR-CON) 20 MEQ tablet Take 20 mEq by mouth daily.   tiotropium (SPIRIVA HANDIHALER) 18 MCG inhalation capsule Place 1 capsule (18 mcg total) into inhaler and inhale daily.   verapamil (CALAN-SR) 240 MG CR tablet Take 240 mg by mouth daily.    [DISCONTINUED] SYNTHROID 100 MCG tablet Take 100 mcg by mouth daily before breakfast.    No facility-administered encounter medications on file as of 09/01/2023.    ALLERGIES: No Known Allergies VACCINATION STATUS: Immunization History  Administered Date(s) Administered   Fluad Quad(high Dose 65+) 08/12/2020   Pneumococcal Polysaccharide-23 06/16/2018     HPI   Michelle Wall  is a patient with the above medical history. she was diagnosed with hypothyroidism at approximate age of 28 years- she notes she had goiter and had partial thyroidectomy at that time (right side), which required subsequent initiation of thyroid hormone replacement therapy. she was given various doses of Levothyroxine over the years, currently on 100 micrograms. she reports compliance to this medication:  Taking it daily on empty stomach with water, separated by >30 minutes before breakfast and other medications, and by at least 4 hours from calcium, iron, PPIs, multivitamins .  She says she sometimes takes it with her other medications in the morning.  I reviewed patient's thyroid tests:  Lab Results  Component Value Date   TSH <0.005 (L) 08/25/2023   TSH 0.40 (A) 03/12/2023   TSH 0.36 (A) 12/16/2020   TSH 0.777 08/08/2020   FREET4 2.47 (H) 08/25/2023     Patient denies any overt symptoms of over or under replacement.  Pt denies feeling nodules in neck, hoarseness, dysphagia/odynophagia, SOB with lying down.  she denies any known family history of thyroid disorders.  No family history of thyroid cancer.  She did have partial thyroidectomy for goiter in the past (right side). No history of radiation therapy to head or neck.  No recent use of iodine supplements.   Denies use of Biotin containing supplements.  I reviewed her chart and she also has a history of GERD, HTN, COPD, ASCVD, smoker.   ROS:  Constitutional: no weight gain/loss, no fatigue, no subjective hyperthermia, no subjective  hypothermia Eyes: no blurry vision, no xerophthalmia ENT: no sore throat, no nodules palpated in throat, no dysphagia/odynophagia, no hoarseness Cardiovascular: no chest pain, no SOB, no palpitations, no leg swelling Respiratory: no cough, no SOB Gastrointestinal: no nausea/vomiting/diarrhea Musculoskeletal: no muscle/joint aches, walks with cane Skin: no rashes Neurological: no tremors, no numbness, no tingling, no dizziness Psychiatric: no depression, no anxiety   Objective:   Objective     BP 102/60 (BP Location: Left Arm, Patient Position: Sitting, Cuff Size: Normal)   Pulse 72   Ht 5\' 1"  (1.549 m)   Wt 126 lb 3.2 oz (57.2 kg)   BMI 23.85 kg/m  Wt Readings from Last 3 Encounters:  09/01/23 126 lb 3.2 oz (57.2 kg)  08/25/23 127 lb 6.4 oz (57.8 kg)  07/31/22 140 lb 3.2 oz (63.6 kg)    BP Readings from Last 3 Encounters:  09/01/23 102/60  08/25/23 138/64  07/31/22 130/74     Constitutional:  Body mass index is 23.85 kg/m., not in acute distress, normal state of mind Eyes: PERRLA, EOMI, no exophthalmos ENT: moist mucous membranes, no thyromegaly, no cervical lymphadenopathy, faint surgical scar to right side of neck (from previous partial thyroidectomy) Gastrointestinal: abdomen soft, non-tender, no distension, bowel sounds present Musculoskeletal: no gross deformities, strength intact in all four extremities, walks with cane Skin: moist, warm, no rashes Neurological: no tremor with outstretched hands, deep tendon reflexes normal in BLE.   CMP ( most recent) CMP     Component Value Date/Time   NA 143 08/19/2020 0550   K 4.2 08/19/2020 0550   CL 109 08/19/2020 0550   CO2 24 08/19/2020 0550   GLUCOSE 120 (H) 08/19/2020 0550   BUN 18  08/19/2020 0550   CREATININE 0.67 08/19/2020 0550   CALCIUM 8.6 (L) 08/19/2020 0550   PROT 6.2 (L) 08/19/2020 0550   ALBUMIN 2.5 (L) 08/19/2020 0550   AST 119 (H) 08/19/2020 0550   ALT 154 (H) 08/19/2020 0550   ALKPHOS 43 08/19/2020 0550   BILITOT 0.4 08/19/2020 0550   GFRNONAA >60 08/19/2020 0550     Diabetic Labs (most recent): Lab Results  Component Value Date   HGBA1C 5.2 08/08/2020     Lipid Panel ( most recent) Lipid Panel     Component Value Date/Time   TRIG 88 08/19/2020 0550       Lab Results  Component Value Date   TSH <0.005 (L) 08/25/2023   TSH 0.40 (A) 03/12/2023   TSH 0.36 (A) 12/16/2020   TSH 0.777 08/08/2020   FREET4 2.47 (H) 08/25/2023     Latest Reference Range & Units 08/08/20 20:53 12/16/20 00:00 03/12/23 00:00 08/25/23 09:19  TSH 0.450 - 4.500 uIU/mL 0.777 0.36 ! (E) 0.40 ! (E) <0.005 (L)  T4,Free(Direct) 0.82 - 1.77 ng/dL    1.61 (H)  Thyroperoxidase Ab SerPl-aCnc 0 - 34 IU/mL    19  Thyroglobulin Antibody 0.0 - 0.9 IU/mL    190.7 (H)  !: Data is abnormal (L): Data is abnormally low (H): Data is abnormally high (E): External lab result  Assessment & Plan:   ASSESSMENT / PLAN:  1. Hypothyroidism-postsurgical?  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  Patient with long-standing hypothyroidism, on levothyroxine therapy. On physical exam, patient does not have gross goiter, thyroid nodules, or neck compression symptoms.   Her previsit labs are suggestive of gross over-replacement.  She is advised to lower her dose of Levothyroxine to 75 mcg po daily before breakfast.  Will recheck  labs in 8 weeks and adjust dosage accordingly.  - We discussed about correct intake of levothyroxine, at fasting, with water, separated by at least 30 minutes from breakfast, and separated by more than 4 hours from calcium, iron, multivitamins, acid reflux medications (PPIs). -Patient is made aware of the fact that thyroid  hormone replacement is needed for life, dose to be adjusted by periodic monitoring of thyroid function tests.    -Due to absence of clinical goiter, no need for thyroid ultrasound.   I spent  20  minutes in the care of the patient today including review of labs from Thyroid Function, CMP, and other relevant labs ; imaging/biopsy records (current and previous including abstractions from other facilities); face-to-face time discussing  her lab results and symptoms, medications doses, her options of short and long term treatment based on the latest standards of care / guidelines;   and documenting the encounter.  [employer]  participated in the discussions, expressed understanding, and voiced agreement with the above plans.  All questions were answered to her satisfaction. she is encouraged to contact clinic should she have any questions or concerns prior to her return visit.   FOLLOW UP PLAN:  Return in about 8 weeks (around 10/27/2023) for Thyroid follow up, Previsit labs.  Ronny Bacon, [facility] [address] [place], [place] [address] Phone: [phone] Fax: [phone]  09/01/2023, 9:14 AM

## 2023-09-08 DIAGNOSIS — Z933 Colostomy status: Secondary | ICD-10-CM | POA: Diagnosis not present

## 2023-09-08 DIAGNOSIS — K56609 Unspecified intestinal obstruction, unspecified as to partial versus complete obstruction: Secondary | ICD-10-CM | POA: Diagnosis not present

## 2023-09-14 DIAGNOSIS — Z933 Colostomy status: Secondary | ICD-10-CM | POA: Diagnosis not present

## 2023-09-14 DIAGNOSIS — Z23 Encounter for immunization: Secondary | ICD-10-CM | POA: Diagnosis not present

## 2023-09-14 DIAGNOSIS — I10 Essential (primary) hypertension: Secondary | ICD-10-CM | POA: Diagnosis not present

## 2023-09-14 DIAGNOSIS — E7800 Pure hypercholesterolemia, unspecified: Secondary | ICD-10-CM | POA: Diagnosis not present

## 2023-09-14 DIAGNOSIS — J439 Emphysema, unspecified: Secondary | ICD-10-CM | POA: Diagnosis not present

## 2023-09-14 DIAGNOSIS — E039 Hypothyroidism, unspecified: Secondary | ICD-10-CM | POA: Diagnosis not present

## 2023-09-14 DIAGNOSIS — E890 Postprocedural hypothyroidism: Secondary | ICD-10-CM | POA: Diagnosis not present

## 2023-09-14 DIAGNOSIS — Z720 Tobacco use: Secondary | ICD-10-CM | POA: Diagnosis not present

## 2023-09-24 DIAGNOSIS — I272 Pulmonary hypertension, unspecified: Secondary | ICD-10-CM | POA: Diagnosis not present

## 2023-09-24 DIAGNOSIS — J449 Chronic obstructive pulmonary disease, unspecified: Secondary | ICD-10-CM | POA: Diagnosis not present

## 2023-10-08 DIAGNOSIS — K56609 Unspecified intestinal obstruction, unspecified as to partial versus complete obstruction: Secondary | ICD-10-CM | POA: Diagnosis not present

## 2023-10-08 DIAGNOSIS — Z933 Colostomy status: Secondary | ICD-10-CM | POA: Diagnosis not present

## 2023-10-18 DIAGNOSIS — E7800 Pure hypercholesterolemia, unspecified: Secondary | ICD-10-CM | POA: Diagnosis not present

## 2023-10-18 DIAGNOSIS — Z0000 Encounter for general adult medical examination without abnormal findings: Secondary | ICD-10-CM | POA: Diagnosis not present

## 2023-10-18 DIAGNOSIS — Z720 Tobacco use: Secondary | ICD-10-CM | POA: Diagnosis not present

## 2023-10-18 DIAGNOSIS — I10 Essential (primary) hypertension: Secondary | ICD-10-CM | POA: Diagnosis not present

## 2023-10-20 ENCOUNTER — Encounter: Payer: Self-pay | Admitting: Cardiology

## 2023-10-20 ENCOUNTER — Ambulatory Visit: Payer: 59 | Attending: Cardiology | Admitting: Cardiology

## 2023-10-20 VITALS — BP 138/82 | HR 76 | Ht 61.0 in | Wt 120.8 lb

## 2023-10-20 DIAGNOSIS — I700 Atherosclerosis of aorta: Secondary | ICD-10-CM

## 2023-10-20 DIAGNOSIS — I38 Endocarditis, valve unspecified: Secondary | ICD-10-CM

## 2023-10-20 DIAGNOSIS — I10 Essential (primary) hypertension: Secondary | ICD-10-CM

## 2023-10-20 NOTE — Patient Instructions (Addendum)
Medication Instructions:  Your physician recommends that you continue on your current medications as directed. Please refer to the Current Medication list given to you today.  Labwork: none  Testing/Procedures: Your physician has requested that you have an echocardiogram. Echocardiography is a painless test that uses sound waves to create images of your heart. It provides your doctor with information about the size and shape of your heart and how well your heart's chambers and valves are working. This procedure takes approximately one hour. There are no restrictions for this procedure. Please do NOT wear cologne, perfume, aftershave, or lotions (deodorant is allowed). Please arrive 15 minutes prior to your appointment time.  Please note: We ask at that you not bring children with you during ultrasound (echo/ vascular) testing. Due to room size and safety concerns, children are not allowed in the ultrasound rooms during exams. Our front office staff cannot provide observation of children in our lobby area while testing is being conducted. An adult accompanying a patient to their appointment will only be allowed in the ultrasound room at the discretion of the ultrasound technician under special circumstances. We apologize for any inconvenience.  Follow-Up: Your physician recommends that you schedule a follow-up appointment in: 1 year. You will receive a reminder call in about 10 months reminding you to schedule your appointment. If you don't receive this call, please contact our office.  Any Other Special Instructions Will Be Listed Below (If Applicable).  If you need a refill on your cardiac medications before your next appointment, please call your pharmacy.

## 2023-10-20 NOTE — Progress Notes (Signed)
    Cardiology Office Note  Date: 10/20/2023   ID: Michelle Wall, DOB [birth year], MRN [id number]  History of Present Illness: Michelle Wall is a 89 y.o. female last seen in September 2023.  She is here for a follow-up visit.  She does not report any increasing shortness of breath, no increasing edema, no orthopnea or PND.  She has had no chest pain.  She lives with her son and is functional with ADLs.  I reviewed her medications.  Current cardiac regimen includes aspirin, Lipitor, HCTZ, Cozaar, and verapamil.  She continues to follow with Dr. Loleta Chance.  I reviewed her ECG today which shows sinus rhythm with leftward axis and nonspecific T wave changes.  Physical Exam: VS:  BP 138/82   Pulse 76   Ht 5\' 1"  (1.549 m)   Wt 120 lb 12.8 oz (54.8 kg)   SpO2 96%   BMI 22.82 kg/m , BMI Body mass index is 22.82 kg/m.  Wt Readings from Last 3 Encounters:  10/20/23 120 lb 12.8 oz (54.8 kg)  09/01/23 126 lb 3.2 oz (57.2 kg)  08/25/23 127 lb 6.4 oz (57.8 kg)    General: Patient appears comfortable at rest. HEENT: Conjunctiva and lids normal. Neck: Supple, no elevated JVP or carotid bruits. Lungs: Clear to auscultation, nonlabored breathing at rest. Cardiac: Regular rate and rhythm, no S3, 2/6 diastolic murmur and 1/6 systolic murmur, no pericardial rub. Extremities: No pitting edema.  ECG:  An ECG dated 07/31/2022 was personally reviewed today and demonstrated:  Sinus rhythm with left anterior fascicular block and nonspecific T wave changes.  Labwork: 08/25/2023: TSH <0.005  April 2024: BUN 11, creatinine 0.67, potassium 3.9, AST 18, ALT 22  Other Studies Reviewed Today:  No interval cardiac testing for review today.  Assessment and Plan:  1.  Valvular heart disease with mild to moderate aortic and tricuspid regurgitation documented by echocardiogram in March 2022.  Plan to update echocardiogram at this point.   2.  Prior history of cor pulmonale, only mildly elevated estimated  PASP by last echocardiogram however.  She does not report any worsening leg edema or increasing shortness of breath.   3.  Coronary and aortic calcification evident by CT imaging.  She is on aspirin and Lipitor.  No chest pain reported.  ECG without acute change.  4.  Primary hypertension.  Continue Cozaar, HCTZ and verapamil  Disposition:  Follow up  1 year.  Signed, Jonelle Sidle, M.D., F.A.C.C. [facility]

## 2023-10-24 DIAGNOSIS — J449 Chronic obstructive pulmonary disease, unspecified: Secondary | ICD-10-CM | POA: Diagnosis not present

## 2023-10-24 DIAGNOSIS — I272 Pulmonary hypertension, unspecified: Secondary | ICD-10-CM | POA: Diagnosis not present

## 2023-10-25 DIAGNOSIS — E039 Hypothyroidism, unspecified: Secondary | ICD-10-CM | POA: Diagnosis not present

## 2023-10-26 LAB — T4, FREE: Free T4: 1.48 ng/dL (ref 0.82–1.77)

## 2023-10-26 LAB — TSH: TSH: 0.029 u[IU]/mL — ABNORMAL LOW (ref 0.450–4.500)

## 2023-10-27 NOTE — Patient Instructions (Signed)

## 2023-10-29 ENCOUNTER — Ambulatory Visit (INDEPENDENT_AMBULATORY_CARE_PROVIDER_SITE_OTHER): Payer: 59 | Admitting: Nurse Practitioner

## 2023-10-29 ENCOUNTER — Encounter: Payer: Self-pay | Admitting: Nurse Practitioner

## 2023-10-29 VITALS — BP 122/65 | HR 85 | Ht 61.0 in | Wt 122.0 lb

## 2023-10-29 DIAGNOSIS — E039 Hypothyroidism, unspecified: Secondary | ICD-10-CM

## 2023-10-29 DIAGNOSIS — E063 Autoimmune thyroiditis: Secondary | ICD-10-CM | POA: Diagnosis not present

## 2023-10-29 MED ORDER — LEVOTHYROXINE SODIUM 75 MCG PO TABS: 75.0000 ug | ORAL_TABLET | Freq: Every day | ORAL | 1 refills | Status: DC

## 2023-10-29 NOTE — Progress Notes (Signed)
Endocrinology Follow Up Note                                         10/29/2023, 8:56 AM  Subjective:   Subjective    Michelle Wall is a 89 y.o.-year-old female patient being seen in follow up after being seen in consultation for hypothyroidism referred by Mirna Mires, MD.   Past Medical History:  Diagnosis Date   Aortic atherosclerosis (HCC)    Carotid atherosclerosis    Cor pulmonale (HCC)    Coronary artery calcification seen on CT scan    Emphysema    Essential hypertension    Hypothyroidism    Pulmonary hypertension (HCC)    Tricuspid insufficiency     Past Surgical History:  Procedure Laterality Date   COLONOSCOPY N/A 12/14/2018   Procedure: COLONOSCOPY;  Surgeon: Malissa Hippo, MD;  Location: [facility];  Service: Endoscopy;  Laterality: N/A;  1:00   COLOSTOMY Left 06/14/2018   Procedure: COLOSTOMY;  Surgeon: Lucretia Roers, MD;  Location: [facility];  Service: General;  Laterality: Left;   LAPAROTOMY N/A 08/09/2020   Procedure: EXPLORATORY LAPAROTOMY;  Surgeon: Lucretia Roers, MD;  Location: [facility];  Service: General;  Laterality: N/A;   LYSIS OF ADHESION N/A 08/09/2020   Procedure: LYSIS OF ADHESIONS;  Surgeon: Lucretia Roers, MD;  Location: [facility];  Service: General;  Laterality: N/A;   PARTIAL COLECTOMY N/A 06/14/2018   Procedure: PARTIAL COLECTOMY;  Surgeon: Lucretia Roers, MD;  Location: [facility];  Service: General;  Laterality: N/A;   POLYPECTOMY  12/14/2018   Procedure: POLYPECTOMY;  Surgeon: Malissa Hippo, MD;  Location: [facility];  Service: Endoscopy;;  rectum   THYROIDECTOMY, PARTIAL     TUBAL LIGATION     midline scar ? possibly tubal ligation    Social History   Socioeconomic History   Marital status: Widowed    Spouse name: Not on file   Number of children: 8   Years of education: Not on file   Highest education level: Not on file  Occupational History    Not on file  Tobacco Use   Smoking status: Every Day    Current packs/day: 0.00    Average packs/day: 0.8 packs/day for 60.0 years (45.0 ttl pk-yrs)    Types: Cigarettes    Start date: 08/08/1960    Last attempt to quit: 08/08/2020    Years since quitting: 3.2    Passive exposure: Never   Smokeless tobacco: Never  Vaping Use   Vaping status: Never Used  Substance and Sexual Activity   Alcohol use: No   Drug use: No   Sexual activity: Not on file  Other Topics Concern   Not on file  Social History Narrative   Not on file   Social Determinants of Health   Financial Resource Strain: Not on file  Food Insecurity: No Food Insecurity (08/17/2022)   Hunger Vital Sign    Worried About Running Out of Food in  the Last Year: Never true    Ran Out of Food in the Last Year: Never true  Transportation Needs: No Transportation Needs (08/17/2022)   PRAPARE - Administrator, Civil Service (Medical): No    Lack of Transportation (Non-Medical): No  Physical Activity: Not on file  Stress: Not on file  Social Connections: Not on file    History reviewed. No pertinent family history.  Outpatient Encounter Medications as of 10/29/2023  Medication Sig   aspirin EC 81 MG tablet Take 1 tablet (81 mg total) by mouth daily with breakfast.   atorvastatin (LIPITOR) 20 MG tablet Take 20 mg by mouth at bedtime.   cholecalciferol (VITAMIN D3) 25 MCG (1000 UT) tablet Take 1,000 Units by mouth daily.   hydrochlorothiazide (HYDRODIURIL) 25 MG tablet Take 25 mg by mouth daily.   latanoprost (XALATAN) 0.005 % ophthalmic solution Place 1 drop into both eyes at bedtime.   losartan (COZAAR) 50 MG tablet Take 50 mg by mouth daily.   tiotropium (SPIRIVA HANDIHALER) 18 MCG inhalation capsule Place 1 capsule (18 mcg total) into inhaler and inhale daily.   verapamil (CALAN-SR) 240 MG CR tablet Take 240 mg by mouth daily.    [DISCONTINUED] levothyroxine (SYNTHROID) 75 MCG tablet Take 1 tablet (75 mcg total)  by mouth daily.   levothyroxine (SYNTHROID) 75 MCG tablet Take 1 tablet (75 mcg total) by mouth daily.   No facility-administered encounter medications on file as of 10/29/2023.    ALLERGIES: No Known Allergies VACCINATION STATUS: Immunization History  Administered Date(s) Administered   Fluad Quad(high Dose 65+) 08/12/2020   Pneumococcal Polysaccharide-23 06/16/2018     HPI   Michelle Wall  is a patient with the above medical history. she was diagnosed with hypothyroidism at approximate age of 23 years- she notes she had goiter and had partial thyroidectomy at that time (right side), which required subsequent initiation of thyroid hormone replacement therapy. she was given various doses of Levothyroxine over the years, currently on 100 micrograms. she reports compliance to this medication:  Taking it daily on empty stomach with water, separated by >30 minutes before breakfast and other medications, and by at least 4 hours from calcium, iron, PPIs, multivitamins .  She says she sometimes takes it with her other medications in the morning.  I reviewed patient's thyroid tests:  Lab Results  Component Value Date   TSH 0.029 (L) 10/25/2023   TSH <0.005 (L) 08/25/2023   TSH 0.40 (A) 03/12/2023   TSH 0.36 (A) 12/16/2020   TSH 0.777 08/08/2020   FREET4 1.48 10/25/2023   FREET4 2.47 (H) 08/25/2023     Patient denies any overt symptoms of over or under replacement.  Pt denies feeling nodules in neck, hoarseness, dysphagia/odynophagia, SOB with lying down.  she denies any known family history of thyroid disorders.  No family history of thyroid cancer.  She did have partial thyroidectomy for goiter in the past (right side). No history of radiation therapy to head or neck.  No recent use of iodine supplements.  Denies use of Biotin containing supplements.  I reviewed her chart and she also has a history of GERD, HTN, COPD, ASCVD, smoker.  Review of systems  Constitutional: +  Minimally fluctuating body weight,  current Body mass index is 23.05 kg/m. , no fatigue, no subjective hyperthermia, no subjective hypothermia Eyes: no blurry vision, no xerophthalmia ENT: no sore throat, no nodules palpated in throat, no dysphagia/odynophagia, no hoarseness Cardiovascular: no chest pain, no shortness  of breath, no palpitations, no leg swelling Respiratory: no cough, no shortness of breath Gastrointestinal: no nausea/vomiting/diarrhea Musculoskeletal: no muscle/joint aches, walks with cane Skin: no rashes, no hyperemia Neurological: no tremors, no numbness, no tingling, no dizziness Psychiatric: no depression, no anxiety   Objective:   Objective     BP 122/65 (BP Location: Left Arm, Patient Position: Sitting, Cuff Size: Large)   Pulse 85   Ht 5\' 1"  (1.549 m)   Wt 122 lb (55.3 kg)   BMI 23.05 kg/m  Wt Readings from Last 3 Encounters:  10/29/23 122 lb (55.3 kg)  10/20/23 120 lb 12.8 oz (54.8 kg)  09/01/23 126 lb 3.2 oz (57.2 kg)    BP Readings from Last 3 Encounters:  10/29/23 122/65  10/20/23 138/82  09/01/23 102/60      Physical Exam- Limited  Constitutional:  Body mass index is 23.05 kg/m. , not in acute distress, normal state of mind Eyes:  EOMI, no exophthalmos Musculoskeletal: no gross deformities, strength intact in all four extremities, no gross restriction of joint movements, walks with cane for disequilibrium Skin:  no rashes, no hyperemia Neurological: no tremor with outstretched hands   CMP ( most recent) CMP     Component Value Date/Time   NA 143 08/19/2020 0550   K 4.2 08/19/2020 0550   CL 109 08/19/2020 0550   CO2 24 08/19/2020 0550   GLUCOSE 120 (H) 08/19/2020 0550   BUN 18 08/19/2020 0550   CREATININE 0.67 08/19/2020 0550   CALCIUM 8.6 (L) 08/19/2020 0550   PROT 6.2 (L) 08/19/2020 0550   ALBUMIN 2.5 (L) 08/19/2020 0550   AST 119 (H) 08/19/2020 0550   ALT 154 (H) 08/19/2020 0550   ALKPHOS 43 08/19/2020 0550   BILITOT 0.4  08/19/2020 0550   GFRNONAA >60 08/19/2020 0550     Diabetic Labs (most recent): Lab Results  Component Value Date   HGBA1C 5.2 08/08/2020     Lipid Panel ( most recent) Lipid Panel     Component Value Date/Time   TRIG 88 08/19/2020 0550       Lab Results  Component Value Date   TSH 0.029 (L) 10/25/2023   TSH <0.005 (L) 08/25/2023   TSH 0.40 (A) 03/12/2023   TSH 0.36 (A) 12/16/2020   TSH 0.777 08/08/2020   FREET4 1.48 10/25/2023   FREET4 2.47 (H) 08/25/2023     Latest Reference Range & Units 08/08/20 20:53 12/16/20 00:00 03/12/23 00:00 08/25/23 09:19 10/25/23 08:08  TSH 0.450 - 4.500 uIU/mL 0.777 0.36 ! (E) 0.40 ! (E) <0.005 (L) 0.029 (L)  T4,Free(Direct) 0.82 - 1.77 ng/dL    9.14 (H) 7.82  Thyroperoxidase Ab SerPl-aCnc 0 - 34 IU/mL    19   Thyroglobulin Antibody 0.0 - 0.9 IU/mL    190.7 (H)   !: Data is abnormal (L): Data is abnormally low (H): Data is abnormally high (E): External lab result  Assessment & Plan:   ASSESSMENT / PLAN:  1. Hypothyroidism-r/t Hashimotos thyroiditis  Positive thyroid antibodies are suggestive of autoimmune thyroid dysfunction.  Patient with long-standing hypothyroidism, on levothyroxine therapy. On physical exam, patient does not have gross goiter, thyroid nodules, or neck compression symptoms.   Her previsit labs are suggestive of appropriate hormone replacement (TSH still suppressed but improving, and FT4 is now within the normal range).  She is advised to continue her dose of Levothyroxine 75 mcg po daily before breakfast.  Will recheck labs in 3 weeks and adjust dosage accordingly.  - We discussed  about correct intake of levothyroxine, at fasting, with water, separated by at least 30 minutes from breakfast, and separated by more than 4 hours from calcium, iron, multivitamins, acid reflux medications (PPIs). -Patient is made aware of the fact that thyroid hormone replacement is needed for life, dose to be adjusted by periodic  monitoring of thyroid function tests.    -Due to absence of clinical goiter, no need for thyroid ultrasound.   I spent  20  minutes in the care of the patient today including review of labs from Thyroid Function, CMP, and other relevant labs ; imaging/biopsy records (current and previous including abstractions from other facilities); face-to-face time discussing  her lab results and symptoms, medications doses, her options of short and long term treatment based on the latest standards of care / guidelines;   and documenting the encounter.  [employer]  participated in the discussions, expressed understanding, and voiced agreement with the above plans.  All questions were answered to her satisfaction. she is encouraged to contact clinic should she have any questions or concerns prior to her return visit.   FOLLOW UP PLAN:  Return in about 3 months (around 01/27/2024) for Thyroid follow up, Previsit labs.  Ronny Bacon, [facility] [address] [place], [place] [address] Phone: [phone] Fax: [phone]  10/29/2023, 8:56 AM

## 2023-11-03 ENCOUNTER — Ambulatory Visit: Payer: 59 | Attending: Cardiology

## 2023-11-03 DIAGNOSIS — I700 Atherosclerosis of aorta: Secondary | ICD-10-CM

## 2023-11-04 LAB — ECHOCARDIOGRAM COMPLETE
AR max vel: 3.82 cm2
AV Area VTI: 3.65 cm2
AV Area mean vel: 3.98 cm2
AV Mean grad: 3 mm[Hg]
AV Peak grad: 6.3 mm[Hg]
Ao pk vel: 1.25 m/s
Area-P 1/2: 3.16 cm2
Calc EF: 70.5 %
MV VTI: 3.08 cm2
P 1/2 time: 413 ms
S' Lateral: 2.3 cm
Single Plane A2C EF: 67.2 %
Single Plane A4C EF: 72.5 %

## 2023-11-09 DIAGNOSIS — Z933 Colostomy status: Secondary | ICD-10-CM | POA: Diagnosis not present

## 2023-11-09 DIAGNOSIS — K56609 Unspecified intestinal obstruction, unspecified as to partial versus complete obstruction: Secondary | ICD-10-CM | POA: Diagnosis not present

## 2023-11-15 ENCOUNTER — Telehealth: Payer: Self-pay | Admitting: *Deleted

## 2023-11-15 NOTE — Telephone Encounter (Signed)
Patient informed and verbalized understanding of plan. Copy sent to PCP 

## 2023-11-15 NOTE — Telephone Encounter (Signed)
-----   Message from Nona Dell sent at 11/05/2023  2:29 PM EST ----- Results reviewed.  Follow-up echocardiogram shows vigorous LVEF at 65 to 70%.  Aortic regurgitation is moderate, ascending aorta is mildly dilated at 39 mm.  Tricuspid regurgitation is mild at this point.  Will plan on a follow-up echocardiogram in 1 year for surveillance.

## 2023-11-24 DIAGNOSIS — J449 Chronic obstructive pulmonary disease, unspecified: Secondary | ICD-10-CM | POA: Diagnosis not present

## 2023-11-24 DIAGNOSIS — I272 Pulmonary hypertension, unspecified: Secondary | ICD-10-CM | POA: Diagnosis not present

## 2023-11-28 ENCOUNTER — Other Ambulatory Visit: Payer: Self-pay | Admitting: Nurse Practitioner

## 2023-11-28 DIAGNOSIS — E039 Hypothyroidism, unspecified: Secondary | ICD-10-CM

## 2023-12-09 DIAGNOSIS — Z933 Colostomy status: Secondary | ICD-10-CM | POA: Diagnosis not present

## 2023-12-09 DIAGNOSIS — K56609 Unspecified intestinal obstruction, unspecified as to partial versus complete obstruction: Secondary | ICD-10-CM | POA: Diagnosis not present

## 2024-01-03 DIAGNOSIS — H401132 Primary open-angle glaucoma, bilateral, moderate stage: Secondary | ICD-10-CM | POA: Diagnosis not present

## 2024-01-07 DIAGNOSIS — Z933 Colostomy status: Secondary | ICD-10-CM | POA: Diagnosis not present

## 2024-01-07 DIAGNOSIS — K56609 Unspecified intestinal obstruction, unspecified as to partial versus complete obstruction: Secondary | ICD-10-CM | POA: Diagnosis not present

## 2024-01-28 DIAGNOSIS — E063 Autoimmune thyroiditis: Secondary | ICD-10-CM | POA: Diagnosis not present

## 2024-01-29 LAB — TSH: TSH: 7.37 u[IU]/mL — ABNORMAL HIGH (ref 0.450–4.500)

## 2024-01-29 LAB — T4, FREE: Free T4: 1 ng/dL (ref 0.82–1.77)

## 2024-02-01 NOTE — Patient Instructions (Signed)

## 2024-02-02 ENCOUNTER — Ambulatory Visit (INDEPENDENT_AMBULATORY_CARE_PROVIDER_SITE_OTHER): Payer: 59 | Admitting: Nurse Practitioner

## 2024-02-02 ENCOUNTER — Encounter: Payer: Self-pay | Admitting: Nurse Practitioner

## 2024-02-02 VITALS — BP 120/70 | HR 66 | Ht 61.0 in | Wt 118.6 lb

## 2024-02-02 DIAGNOSIS — E063 Autoimmune thyroiditis: Secondary | ICD-10-CM

## 2024-02-02 DIAGNOSIS — E039 Hypothyroidism, unspecified: Secondary | ICD-10-CM | POA: Diagnosis not present

## 2024-02-02 MED ORDER — LEVOTHYROXINE SODIUM 75 MCG PO TABS: 75.0000 ug | ORAL_TABLET | Freq: Every day | ORAL | 1 refills | Status: DC

## 2024-02-02 NOTE — Progress Notes (Signed)
 Endocrinology Follow Up Note                                         02/02/2024, 11:57 AM  Subjective:   Subjective    Michelle Wall is a 89 y.o.-year-old female patient being seen in follow up after being seen in consultation for hypothyroidism referred by Mirna Mires, MD.   Past Medical History:  Diagnosis Date   Aortic atherosclerosis (HCC)    Carotid atherosclerosis    Cor pulmonale (HCC)    Coronary artery calcification seen on CT scan    Emphysema    Essential hypertension    Hypothyroidism    Pulmonary hypertension (HCC)    Tricuspid insufficiency     Past Surgical History:  Procedure Laterality Date   COLONOSCOPY N/A 12/14/2018   Procedure: COLONOSCOPY;  Surgeon: Malissa Hippo, MD;  [facility];  Service: Endoscopy;  Laterality: N/A;  1:00   COLOSTOMY Left 06/14/2018   Procedure: COLOSTOMY;  Surgeon: Lucretia Roers, MD;  Location: [facility];  Service: General;  Laterality: Left;   LAPAROTOMY N/A 08/09/2020   Procedure: EXPLORATORY LAPAROTOMY;  Surgeon: Lucretia Roers, MD;  Location: [facility];  Service: General;  Laterality: N/A;   LYSIS OF ADHESION N/A 08/09/2020   Procedure: LYSIS OF ADHESIONS;  Surgeon: Lucretia Roers, MD;  Location: [facility];  Service: General;  Laterality: N/A;   PARTIAL COLECTOMY N/A 06/14/2018   Procedure: PARTIAL COLECTOMY;  Surgeon: Lucretia Roers, MD;  Location: [facility];  Service: General;  Laterality: N/A;   POLYPECTOMY  12/14/2018   Procedure: POLYPECTOMY;  Surgeon: Malissa Hippo, MD;  [facility];  Service: Endoscopy;;  rectum   THYROIDECTOMY, PARTIAL     TUBAL LIGATION     midline scar ? possibly tubal ligation    Social History   Socioeconomic History   Marital status: Widowed    Spouse name: Not on file   Number of children: 8   Years of education: Not on file   Highest education level: Not on file  Occupational History    Not on file  Tobacco Use   Smoking status: Every Day    Current packs/day: 0.00    Average packs/day: 0.8 packs/day for 60.0 years (45.0 ttl pk-yrs)    Types: Cigarettes    Start date: 08/08/1960    Last attempt to quit: 08/08/2020    Years since quitting: 3.4    Passive exposure: Never   Smokeless tobacco: Never  Vaping Use   Vaping status: Never Used  Substance and Sexual Activity   Alcohol use: No   Drug use: No   Sexual activity: Not on file  Other Topics Concern   Not on file  Social History Narrative   Not on file   Social Drivers of Health   Financial Resource Strain: Not on file  Food Insecurity: No Food Insecurity (08/17/2022)   Hunger Vital Sign    Worried About Running Out of Food in  the Last Year: Never true    Ran Out of Food in the Last Year: Never true  Transportation Needs: No Transportation Needs (08/17/2022)   PRAPARE - Administrator, Civil Service (Medical): No    Lack of Transportation (Non-Medical): No  Physical Activity: Not on file  Stress: Not on file  Social Connections: Not on file    History reviewed. No pertinent family history.  Outpatient Encounter Medications as of 02/02/2024  Medication Sig   aspirin EC 81 MG tablet Take 1 tablet (81 mg total) by mouth daily with breakfast.   atorvastatin (LIPITOR) 20 MG tablet Take 20 mg by mouth at bedtime.   cholecalciferol (VITAMIN D3) 25 MCG (1000 UT) tablet Take 1,000 Units by mouth daily.   hydrochlorothiazide (HYDRODIURIL) 25 MG tablet Take 25 mg by mouth daily.   latanoprost (XALATAN) 0.005 % ophthalmic solution Place 1 drop into both eyes at bedtime.   losartan (COZAAR) 50 MG tablet Take 50 mg by mouth daily.   verapamil (CALAN-SR) 240 MG CR tablet Take 240 mg by mouth daily.    [DISCONTINUED] levothyroxine (SYNTHROID) 75 MCG tablet TAKE 1 TABLET(75 MCG) BY MOUTH DAILY   levothyroxine (SYNTHROID) 75 MCG tablet Take 1 tablet (75 mcg total) by mouth daily before breakfast.   tiotropium  (SPIRIVA HANDIHALER) 18 MCG inhalation capsule Place 1 capsule (18 mcg total) into inhaler and inhale daily. (Patient not taking: Reported on 02/02/2024)   No facility-administered encounter medications on file as of 02/02/2024.    ALLERGIES: No Known Allergies VACCINATION STATUS: Immunization History  Administered Date(s) Administered   Fluad Quad(high Dose 65+) 08/12/2020   Pneumococcal Polysaccharide-23 06/16/2018     HPI   Michelle Wall  is a patient with the above medical history. she was diagnosed with hypothyroidism at approximate age of 83 years- she notes she had goiter and had partial thyroidectomy at that time (right side), which required subsequent initiation of thyroid hormone replacement therapy. she was given various doses of Levothyroxine over the years, currently on 100 micrograms. she reports compliance to this medication:  Taking it daily on empty stomach with water, separated by >30 minutes before breakfast and other medications, and by at least 4 hours from calcium, iron, PPIs, multivitamins .  She says she sometimes takes it with her other medications in the morning.  I reviewed patient's thyroid tests:  Lab Results  Component Value Date   TSH 7.370 (H) 01/28/2024   TSH 0.029 (L) 10/25/2023   TSH <0.005 (L) 08/25/2023   TSH 0.40 (A) 03/12/2023   TSH 0.36 (A) 12/16/2020   TSH 0.777 08/08/2020   FREET4 1.00 01/28/2024   FREET4 1.48 10/25/2023   FREET4 2.47 (H) 08/25/2023     Patient denies any overt symptoms of over or under replacement.  Pt denies feeling nodules in neck, hoarseness, dysphagia/odynophagia, SOB with lying down.  she denies any known family history of thyroid disorders.  No family history of thyroid cancer.  She did have partial thyroidectomy for goiter in the past (right side). No history of radiation therapy to head or neck.  No recent use of iodine supplements.  Denies use of Biotin containing supplements.  I reviewed her chart and she  also has a history of GERD, HTN, COPD, ASCVD, smoker.  Review of systems  Constitutional: + Minimally fluctuating body weight,  current Body mass index is 22.41 kg/m. , no fatigue, no subjective hyperthermia, no subjective hypothermia Eyes: no blurry vision, no xerophthalmia ENT: no  sore throat, no nodules palpated in throat, no dysphagia/odynophagia, no hoarseness Cardiovascular: no chest pain, no shortness of breath, no palpitations, no leg swelling Respiratory: no cough, no shortness of breath Gastrointestinal: no nausea/vomiting/diarrhea Musculoskeletal: no muscle/joint aches, walks with cane Skin: no rashes, no hyperemia Neurological: no tremors, no numbness, no tingling, no dizziness Psychiatric: no depression, no anxiety   Objective:   Objective     BP 120/70 (BP Location: Right Arm, Patient Position: Sitting, Cuff Size: Large)   Pulse 66   Ht 5\' 1"  (1.549 m)   Wt 118 lb 9.6 oz (53.8 kg)   BMI 22.41 kg/m  Wt Readings from Last 3 Encounters:  02/02/24 118 lb 9.6 oz (53.8 kg)  10/29/23 122 lb (55.3 kg)  10/20/23 120 lb 12.8 oz (54.8 kg)    BP Readings from Last 3 Encounters:  02/02/24 120/70  10/29/23 122/65  10/20/23 138/82      Physical Exam- Limited  Constitutional:  Body mass index is 22.41 kg/m. , not in acute distress, normal state of mind Eyes:  EOMI, no exophthalmos Musculoskeletal: no gross deformities, strength intact in all four extremities, no gross restriction of joint movements, walks with cane for disequilibrium Skin:  no rashes, no hyperemia Neurological: no tremor with outstretched hands   CMP ( most recent) CMP     Component Value Date/Time   NA 143 08/19/2020 0550   K 4.2 08/19/2020 0550   CL 109 08/19/2020 0550   CO2 24 08/19/2020 0550   GLUCOSE 120 (H) 08/19/2020 0550   BUN 18 08/19/2020 0550   CREATININE 0.67 08/19/2020 0550   CALCIUM 8.6 (L) 08/19/2020 0550   PROT 6.2 (L) 08/19/2020 0550   ALBUMIN 2.5 (L) 08/19/2020 0550    AST 119 (H) 08/19/2020 0550   ALT 154 (H) 08/19/2020 0550   ALKPHOS 43 08/19/2020 0550   BILITOT 0.4 08/19/2020 0550   GFRNONAA >60 08/19/2020 0550     Diabetic Labs (most recent): Lab Results  Component Value Date   HGBA1C 5.2 08/08/2020     Lipid Panel ( most recent) Lipid Panel     Component Value Date/Time   TRIG 88 08/19/2020 0550       Lab Results  Component Value Date   TSH 7.370 (H) 01/28/2024   TSH 0.029 (L) 10/25/2023   TSH <0.005 (L) 08/25/2023   TSH 0.40 (A) 03/12/2023   TSH 0.36 (A) 12/16/2020   TSH 0.777 08/08/2020   FREET4 1.00 01/28/2024   FREET4 1.48 10/25/2023   FREET4 2.47 (H) 08/25/2023     Latest Reference Range & Units 08/08/20 20:53 12/16/20 00:00 03/12/23 00:00 08/25/23 09:19 10/25/23 08:08 01/28/24 08:53  TSH 0.450 - 4.500 uIU/mL 0.777 0.36 ! (E) 0.40 ! (E) <0.005 (L) 0.029 (L) 7.370 (H)  T4,Free(Direct) 0.82 - 1.77 ng/dL    2.13 (H) 0.86 5.78  Thyroperoxidase Ab SerPl-aCnc 0 - 34 IU/mL    19    Thyroglobulin Antibody 0.0 - 0.9 IU/mL    190.7 (H)    !: Data is abnormal (L): Data is abnormally low (H): Data is abnormally high (E): External lab result  Assessment & Plan:   ASSESSMENT / PLAN:  1. Hypothyroidism-r/t Hashimotos thyroiditis  Positive thyroid antibodies are suggestive of autoimmune thyroid dysfunction.  Patient with long-standing hypothyroidism, on levothyroxine therapy. On physical exam, patient does not have gross goiter, thyroid nodules, or neck compression symptoms.   Her previsit labs are consistent with under-replacement, however she does note she has been taking her  Levothyroxine along side her BP pill in the morning which is likely impacting absorption rates.  She is advised to continue Levothyroxine 75 mcg po daily before breakfast, but to start taking it in the night when she gets up to use the restroom.    Will recheck labs in 4 months and adjust dosage accordingly.  - We discussed about correct intake of  levothyroxine, at fasting, with water, separated by at least 30 minutes from breakfast, and separated by more than 4 hours from calcium, iron, multivitamins, acid reflux medications (PPIs). -Patient is made aware of the fact that thyroid hormone replacement is needed for life, dose to be adjusted by periodic monitoring of thyroid function tests.    -Due to absence of clinical goiter, no need for thyroid ultrasound.    I spent  18  minutes in the care of the patient today including review of labs from Thyroid Function, CMP, and other relevant labs ; imaging/biopsy records (current and previous including abstractions from other facilities); face-to-face time discussing  her lab results and symptoms, medications doses, her options of short and long term treatment based on the latest standards of care / guidelines;   and documenting the encounter.  [employer]  participated in the discussions, expressed understanding, and voiced agreement with the above plans.  All questions were answered to her satisfaction. she is encouraged to contact clinic should she have any questions or concerns prior to her return visit.   FOLLOW UP PLAN:  Return in about 4 months (around 06/03/2024) for Thyroid follow up, Previsit labs.  Ronny Bacon, [facility] [address] [place], [place] [address] Phone: [phone] Fax: [phone]  02/02/2024, 11:57 AM

## 2024-02-04 DIAGNOSIS — K56609 Unspecified intestinal obstruction, unspecified as to partial versus complete obstruction: Secondary | ICD-10-CM | POA: Diagnosis not present

## 2024-02-04 DIAGNOSIS — Z933 Colostomy status: Secondary | ICD-10-CM | POA: Diagnosis not present

## 2024-02-07 DIAGNOSIS — Z720 Tobacco use: Secondary | ICD-10-CM | POA: Diagnosis not present

## 2024-02-07 DIAGNOSIS — E7800 Pure hypercholesterolemia, unspecified: Secondary | ICD-10-CM | POA: Diagnosis not present

## 2024-02-07 DIAGNOSIS — J439 Emphysema, unspecified: Secondary | ICD-10-CM | POA: Diagnosis not present

## 2024-02-07 DIAGNOSIS — E039 Hypothyroidism, unspecified: Secondary | ICD-10-CM | POA: Diagnosis not present

## 2024-02-07 DIAGNOSIS — I10 Essential (primary) hypertension: Secondary | ICD-10-CM | POA: Diagnosis not present

## 2024-03-08 DIAGNOSIS — Z933 Colostomy status: Secondary | ICD-10-CM | POA: Diagnosis not present

## 2024-03-08 DIAGNOSIS — K56609 Unspecified intestinal obstruction, unspecified as to partial versus complete obstruction: Secondary | ICD-10-CM | POA: Diagnosis not present

## 2024-04-06 DIAGNOSIS — Z933 Colostomy status: Secondary | ICD-10-CM | POA: Diagnosis not present

## 2024-04-06 DIAGNOSIS — K56609 Unspecified intestinal obstruction, unspecified as to partial versus complete obstruction: Secondary | ICD-10-CM | POA: Diagnosis not present

## 2024-04-19 DIAGNOSIS — H04123 Dry eye syndrome of bilateral lacrimal glands: Secondary | ICD-10-CM | POA: Diagnosis not present

## 2024-05-05 DIAGNOSIS — Z933 Colostomy status: Secondary | ICD-10-CM | POA: Diagnosis not present

## 2024-05-05 DIAGNOSIS — K56609 Unspecified intestinal obstruction, unspecified as to partial versus complete obstruction: Secondary | ICD-10-CM | POA: Diagnosis not present

## 2024-05-06 ENCOUNTER — Other Ambulatory Visit: Payer: Self-pay | Admitting: Nurse Practitioner

## 2024-05-06 DIAGNOSIS — E039 Hypothyroidism, unspecified: Secondary | ICD-10-CM

## 2024-05-23 DIAGNOSIS — J449 Chronic obstructive pulmonary disease, unspecified: Secondary | ICD-10-CM | POA: Diagnosis not present

## 2024-05-23 DIAGNOSIS — I272 Pulmonary hypertension, unspecified: Secondary | ICD-10-CM | POA: Diagnosis not present

## 2024-06-01 DIAGNOSIS — E039 Hypothyroidism, unspecified: Secondary | ICD-10-CM | POA: Diagnosis not present

## 2024-06-02 DIAGNOSIS — Z933 Colostomy status: Secondary | ICD-10-CM | POA: Diagnosis not present

## 2024-06-02 DIAGNOSIS — K56609 Unspecified intestinal obstruction, unspecified as to partial versus complete obstruction: Secondary | ICD-10-CM | POA: Diagnosis not present

## 2024-06-02 LAB — TSH: TSH: 5.57 u[IU]/mL — ABNORMAL HIGH (ref 0.450–4.500)

## 2024-06-02 LAB — T4, FREE: Free T4: 1.12 ng/dL (ref 0.82–1.77)

## 2024-06-05 NOTE — Patient Instructions (Signed)

## 2024-06-06 ENCOUNTER — Encounter: Payer: Self-pay | Admitting: Nurse Practitioner

## 2024-06-06 ENCOUNTER — Ambulatory Visit (INDEPENDENT_AMBULATORY_CARE_PROVIDER_SITE_OTHER): Admitting: Nurse Practitioner

## 2024-06-06 VITALS — BP 128/60 | HR 64 | Ht 61.0 in | Wt 116.0 lb

## 2024-06-06 DIAGNOSIS — E039 Hypothyroidism, unspecified: Secondary | ICD-10-CM

## 2024-06-06 DIAGNOSIS — E063 Autoimmune thyroiditis: Secondary | ICD-10-CM

## 2024-06-06 MED ORDER — LEVOTHYROXINE SODIUM 75 MCG PO TABS: 75.0000 ug | ORAL_TABLET | Freq: Every day | ORAL | 3 refills | Status: DC

## 2024-06-06 NOTE — Progress Notes (Signed)
 Endocrinology Follow Up Note                                         06/06/2024, 11:27 AM  Subjective:   Subjective    Michelle Wall is a 89 y.o.-year-old female patient being seen in follow up after being seen in consultation for hypothyroidism referred by Leigh Lung, MD.   Past Medical History:  Diagnosis Date   Aortic atherosclerosis (HCC)    Carotid atherosclerosis    Cor pulmonale (HCC)    Coronary artery calcification seen on CT scan    Emphysema    Essential hypertension    Hypothyroidism    Pulmonary hypertension (HCC)    Tricuspid insufficiency     Past Surgical History:  Procedure Laterality Date   COLONOSCOPY N/A 12/14/2018   Procedure: COLONOSCOPY;  Surgeon: Golda Claudis PENNER, MD;  [facility];  Service: Endoscopy;  Laterality: N/A;  1:00   COLOSTOMY Left 06/14/2018   Procedure: COLOSTOMY;  Surgeon: Kallie Manuelita BROCKS, MD;  Location: [facility];  Service: General;  Laterality: Left;   LAPAROTOMY N/A 08/09/2020   Procedure: EXPLORATORY LAPAROTOMY;  Surgeon: Kallie Manuelita BROCKS, MD;  Location: [facility];  Service: General;  Laterality: N/A;   LYSIS OF ADHESION N/A 08/09/2020   Procedure: LYSIS OF ADHESIONS;  Surgeon: Kallie Manuelita BROCKS, MD;  Location: [facility];  Service: General;  Laterality: N/A;   PARTIAL COLECTOMY N/A 06/14/2018   Procedure: PARTIAL COLECTOMY;  Surgeon: Kallie Manuelita BROCKS, MD;  Location: [facility];  Service: General;  Laterality: N/A;   POLYPECTOMY  12/14/2018   Procedure: POLYPECTOMY;  Surgeon: Golda Claudis PENNER, MD;  [facility];  Service: Endoscopy;;  rectum   THYROIDECTOMY, PARTIAL     TUBAL LIGATION     midline scar ? possibly tubal ligation    Social History   Socioeconomic History   Marital status: Widowed    Spouse name: Not on file   Number of children: 8   Years of education: Not on file   Highest education level: Not on file  Occupational History    Not on file  Tobacco Use   Smoking status: Every Day    Current packs/day: 0.00    Average packs/day: 0.8 packs/day for 60.0 years (45.0 ttl pk-yrs)    Types: Cigarettes    Start date: 08/08/1960    Last attempt to quit: 08/08/2020    Years since quitting: 3.8    Passive exposure: Never   Smokeless tobacco: Never  Vaping Use   Vaping status: Never Used  Substance and Sexual Activity   Alcohol use: No   Drug use: No   Sexual activity: Not on file  Other Topics Concern   Not on file  Social History Narrative   Not on file   Social Drivers of Health   Financial Resource Strain: Not on file  Food Insecurity: No Food Insecurity (08/17/2022)   Hunger Vital Sign    Worried About Running Out of Food in  the Last Year: Never true    Ran Out of Food in the Last Year: Never true  Transportation Needs: No Transportation Needs (08/17/2022)   PRAPARE - Administrator, Civil Service (Medical): No    Lack of Transportation (Non-Medical): No  Physical Activity: Not on file  Stress: Not on file  Social Connections: Not on file    History reviewed. No pertinent family history.  Outpatient Encounter Medications as of 06/06/2024  Medication Sig   aspirin  EC 81 MG tablet Take 1 tablet (81 mg total) by mouth daily with breakfast.   atorvastatin (LIPITOR) 20 MG tablet Take 20 mg by mouth at bedtime.   cholecalciferol (VITAMIN D3) 25 MCG (1000 UT) tablet Take 1,000 Units by mouth daily.   hydrochlorothiazide  (HYDRODIURIL ) 25 MG tablet Take 25 mg by mouth daily.   latanoprost  (XALATAN ) 0.005 % ophthalmic solution Place 1 drop into both eyes at bedtime.   losartan (COZAAR) 50 MG tablet Take 50 mg by mouth daily.   verapamil  (CALAN -SR) 240 MG CR tablet Take 240 mg by mouth daily.    [DISCONTINUED] levothyroxine  (SYNTHROID ) 75 MCG tablet TAKE 1 TABLET(75 MCG) BY MOUTH DAILY BEFORE BREAKFAST   levothyroxine  (SYNTHROID ) 75 MCG tablet Take 1 tablet (75 mcg total) by mouth daily before  breakfast.   tiotropium (SPIRIVA  HANDIHALER) 18 MCG inhalation capsule Place 1 capsule (18 mcg total) into inhaler and inhale daily. (Patient not taking: Reported on 06/06/2024)   No facility-administered encounter medications on file as of 06/06/2024.    ALLERGIES: No Known Allergies VACCINATION STATUS: Immunization History  Administered Date(s) Administered   Fluad Quad(high Dose 65+) 08/12/2020   Pneumococcal Polysaccharide-23 06/16/2018     HPI   Lanique H Stare  is a patient with the above medical history. she was diagnosed with hypothyroidism at approximate age of 21 years- she notes she had goiter and had partial thyroidectomy at that time (right side), which required subsequent initiation of thyroid  hormone replacement therapy. she was given various doses of Levothyroxine  over the years, currently on 100 micrograms. she reports compliance to this medication:  Taking it daily on empty stomach with water , separated by >30 minutes before breakfast and other medications, and by at least 4 hours from calcium , iron, PPIs, multivitamins .  She says she sometimes takes it with her other medications in the morning.  I reviewed patient's thyroid  tests:  Lab Results  Component Value Date   TSH 5.570 (H) 06/01/2024   TSH 7.370 (H) 01/28/2024   TSH 0.029 (L) 10/25/2023   TSH <0.005 (L) 08/25/2023   TSH 0.40 (A) 03/12/2023   TSH 0.36 (A) 12/16/2020   TSH 0.777 08/08/2020   FREET4 1.12 06/01/2024   FREET4 1.00 01/28/2024   FREET4 1.48 10/25/2023   FREET4 2.47 (H) 08/25/2023     Patient denies any overt symptoms of over or under replacement.  Pt denies feeling nodules in neck, hoarseness, dysphagia/odynophagia, SOB with lying down.  she denies any known family history of thyroid  disorders.  No family history of thyroid  cancer.  She did have partial thyroidectomy for goiter in the past (right side). No history of radiation therapy to head or neck.  No recent use of iodine  supplements.  Denies use of Biotin containing supplements.  I reviewed her chart and she also has a history of GERD, HTN, COPD, ASCVD, smoker.  Review of systems  Constitutional: +decreasing body weight,  current Body mass index is 21.92 kg/m. , no fatigue, no subjective hyperthermia,  no subjective hypothermia Eyes: no blurry vision, no xerophthalmia ENT: no sore throat, no nodules palpated in throat, no dysphagia/odynophagia, no hoarseness Cardiovascular: no chest pain, no shortness of breath, no palpitations, no leg swelling Respiratory: no cough, no shortness of breath Gastrointestinal: no nausea/vomiting/diarrhea Musculoskeletal: + diffuse muscle/joint aches (arthritis), walks with cane Skin: no rashes, no hyperemia Neurological: no tremors, no numbness, no tingling, no dizziness Psychiatric: no depression, no anxiety   Objective:   Objective     BP 128/60 (BP Location: Right Arm, Patient Position: Sitting, Cuff Size: Large)   Pulse 64   Ht 5' 1 (1.549 m)   Wt 116 lb (52.6 kg)   BMI 21.92 kg/m  Wt Readings from Last 3 Encounters:  06/06/24 116 lb (52.6 kg)  02/02/24 118 lb 9.6 oz (53.8 kg)  10/29/23 122 lb (55.3 kg)    BP Readings from Last 3 Encounters:  06/06/24 128/60  02/02/24 120/70  10/29/23 122/65      Physical Exam- Limited  Constitutional:  Body mass index is 21.92 kg/m. , not in acute distress, normal state of mind Eyes:  EOMI, no exophthalmos Musculoskeletal: no gross deformities, strength intact in all four extremities, no gross restriction of joint movements, walks with cane for disequilibrium Skin:  no rashes, no hyperemia Neurological: no tremor with outstretched hands   CMP ( most recent) CMP     Component Value Date/Time   NA 143 08/19/2020 0550   K 4.2 08/19/2020 0550   CL 109 08/19/2020 0550   CO2 24 08/19/2020 0550   GLUCOSE 120 (H) 08/19/2020 0550   BUN 18 08/19/2020 0550   CREATININE 0.67 08/19/2020 0550   CALCIUM  8.6 (L)  08/19/2020 0550   PROT 6.2 (L) 08/19/2020 0550   ALBUMIN 2.5 (L) 08/19/2020 0550   AST 119 (H) 08/19/2020 0550   ALT 154 (H) 08/19/2020 0550   ALKPHOS 43 08/19/2020 0550   BILITOT 0.4 08/19/2020 0550   GFRNONAA >60 08/19/2020 0550     Diabetic Labs (most recent): Lab Results  Component Value Date   HGBA1C 5.2 08/08/2020     Lipid Panel ( most recent) Lipid Panel     Component Value Date/Time   TRIG 88 08/19/2020 0550       Lab Results  Component Value Date   TSH 5.570 (H) 06/01/2024   TSH 7.370 (H) 01/28/2024   TSH 0.029 (L) 10/25/2023   TSH <0.005 (L) 08/25/2023   TSH 0.40 (A) 03/12/2023   TSH 0.36 (A) 12/16/2020   TSH 0.777 08/08/2020   FREET4 1.12 06/01/2024   FREET4 1.00 01/28/2024   FREET4 1.48 10/25/2023   FREET4 2.47 (H) 08/25/2023     Latest Reference Range & Units 03/12/23 00:00 08/25/23 09:19 10/25/23 08:08 01/28/24 08:53 06/01/24 09:44  TSH 0.450 - 4.500 uIU/mL 0.40 ! (E) <0.005 (L) 0.029 (L) 7.370 (H) 5.570 (H)  T4,Free(Direct) 0.82 - 1.77 ng/dL  7.52 (H) 8.51 8.99 8.87  Thyroperoxidase Ab SerPl-aCnc 0 - 34 IU/mL  19     Thyroglobulin Antibody 0.0 - 0.9 IU/mL  190.7 (H)     !: Data is abnormal (L): Data is abnormally low (H): Data is abnormally high (E): External lab result  Assessment & Plan:   ASSESSMENT / PLAN:  1. Hypothyroidism-r/t Hashimotos thyroiditis  Positive thyroid  antibodies are suggestive of autoimmune thyroid  dysfunction.  Patient with long-standing hypothyroidism, on levothyroxine  therapy. On physical exam, patient does not have gross goiter, thyroid  nodules, or neck compression symptoms.   Her previsit labs  show improvement, TSH still not quite at goal but Free T4 is in the middle of the normal range.  She has been taking her medication properly since our last visit.  Her weight based maximum dose would be around 84 mcg and the next possible dose is 88 mcg which may be too much for her.  For safety purposes, will continue her  Levothyroxine  75 mcg po daily before breakfast.   Will recheck labs in 4 months and adjust dosage accordingly.  - We discussed about correct intake of levothyroxine , at fasting, with water , separated by at least 30 minutes from breakfast, and separated by more than 4 hours from calcium , iron, multivitamins, acid reflux medications (PPIs). -Patient is made aware of the fact that thyroid  hormone replacement is needed for life, dose to be adjusted by periodic monitoring of thyroid  function tests.    -Due to absence of clinical goiter, no need for thyroid  ultrasound.   I spent  12  minutes in the care of the patient today including review of labs from Thyroid  Function, CMP, and other relevant labs ; imaging/biopsy records (current and previous including abstractions from other facilities); face-to-face time discussing  her lab results and symptoms, medications doses, her options of short and long term treatment based on the latest standards of care / guidelines;   and documenting the encounter.  [employer]  participated in the discussions, expressed understanding, and voiced agreement with the above plans.  All questions were answered to her satisfaction. she is encouraged to contact clinic should she have any questions or concerns prior to her return visit.   FOLLOW UP PLAN:  Return in about 4 months (around 10/07/2024) for Thyroid  follow up, Previsit labs.  Benton Rio, [facility] [address] [place], [place] [address] Phone: [phone] Fax: [phone]  06/06/2024, 11:27 AM

## 2024-06-12 DIAGNOSIS — Z933 Colostomy status: Secondary | ICD-10-CM | POA: Diagnosis not present

## 2024-06-12 DIAGNOSIS — Z720 Tobacco use: Secondary | ICD-10-CM | POA: Diagnosis not present

## 2024-06-12 DIAGNOSIS — I10 Essential (primary) hypertension: Secondary | ICD-10-CM | POA: Diagnosis not present

## 2024-06-12 DIAGNOSIS — E039 Hypothyroidism, unspecified: Secondary | ICD-10-CM | POA: Diagnosis not present

## 2024-06-12 DIAGNOSIS — J439 Emphysema, unspecified: Secondary | ICD-10-CM | POA: Diagnosis not present

## 2024-06-15 DIAGNOSIS — I10 Essential (primary) hypertension: Secondary | ICD-10-CM | POA: Diagnosis not present

## 2024-06-15 DIAGNOSIS — E7800 Pure hypercholesterolemia, unspecified: Secondary | ICD-10-CM | POA: Diagnosis not present

## 2024-07-03 DIAGNOSIS — Z933 Colostomy status: Secondary | ICD-10-CM | POA: Diagnosis not present

## 2024-07-03 DIAGNOSIS — K56609 Unspecified intestinal obstruction, unspecified as to partial versus complete obstruction: Secondary | ICD-10-CM | POA: Diagnosis not present

## 2024-07-28 ENCOUNTER — Other Ambulatory Visit: Payer: Self-pay | Admitting: *Deleted

## 2024-07-28 DIAGNOSIS — I38 Endocarditis, valve unspecified: Secondary | ICD-10-CM

## 2024-08-03 DIAGNOSIS — K56609 Unspecified intestinal obstruction, unspecified as to partial versus complete obstruction: Secondary | ICD-10-CM | POA: Diagnosis not present

## 2024-08-31 DIAGNOSIS — K56609 Unspecified intestinal obstruction, unspecified as to partial versus complete obstruction: Secondary | ICD-10-CM | POA: Diagnosis not present

## 2024-08-31 DIAGNOSIS — Z933 Colostomy status: Secondary | ICD-10-CM | POA: Diagnosis not present

## 2024-09-12 DIAGNOSIS — E039 Hypothyroidism, unspecified: Secondary | ICD-10-CM | POA: Diagnosis not present

## 2024-09-12 DIAGNOSIS — E7800 Pure hypercholesterolemia, unspecified: Secondary | ICD-10-CM | POA: Diagnosis not present

## 2024-09-12 DIAGNOSIS — J439 Emphysema, unspecified: Secondary | ICD-10-CM | POA: Diagnosis not present

## 2024-09-12 DIAGNOSIS — Z933 Colostomy status: Secondary | ICD-10-CM | POA: Diagnosis not present

## 2024-09-12 DIAGNOSIS — I10 Essential (primary) hypertension: Secondary | ICD-10-CM | POA: Diagnosis not present

## 2024-09-20 ENCOUNTER — Other Ambulatory Visit (HOSPITAL_COMMUNITY): Payer: Self-pay

## 2024-10-05 LAB — TSH: TSH: 9.27 u[IU]/mL — ABNORMAL HIGH (ref 0.450–4.500)

## 2024-10-05 LAB — T4, FREE: Free T4: 1.09 ng/dL (ref 0.82–1.77)

## 2024-10-06 ENCOUNTER — Encounter: Payer: Self-pay | Admitting: Cardiology

## 2024-10-06 ENCOUNTER — Ambulatory Visit: Attending: Cardiology | Admitting: Cardiology

## 2024-10-06 VITALS — BP 135/60 | HR 72 | Ht 60.0 in | Wt 125.4 lb

## 2024-10-06 DIAGNOSIS — I351 Nonrheumatic aortic (valve) insufficiency: Secondary | ICD-10-CM | POA: Diagnosis not present

## 2024-10-06 DIAGNOSIS — I10 Essential (primary) hypertension: Secondary | ICD-10-CM | POA: Diagnosis not present

## 2024-10-06 DIAGNOSIS — I38 Endocarditis, valve unspecified: Secondary | ICD-10-CM | POA: Diagnosis not present

## 2024-10-06 DIAGNOSIS — I700 Atherosclerosis of aorta: Secondary | ICD-10-CM | POA: Diagnosis not present

## 2024-10-06 NOTE — Progress Notes (Signed)
    Cardiology Office Note  Date: 10/06/2024   ID: Michelle Wall, DOB Mar 31, 1933, MRN [id number]  History of Present Illness: Michelle Wall is an 89 y.o. female last seen in November 2024.  She is here today with family member for follow-up visit.  Reports no increasing shortness of breath with typical activities, generally NYHA class II.  No chest pain or palpitations.  No syncope.  I reviewed her medications which are stable.  She continues to follow with Dr. Leigh, requesting interval lab work.  I reviewed her ECG today which shows sinus rhythm with leftward axis and nonspecific T wave changes.  She will have a follow-up echocardiogram in December.  Aortic regurgitation was moderate and tricuspid regurgitation had decreased to mild by last echocardiogram.  Physical Exam: VS:  BP 135/60 (BP Location: Right Arm, Cuff Size: Normal)   Pulse 72   Ht 5' (1.524 m)   Wt 125 lb 6.4 oz (56.9 kg)   SpO2 98%   BMI 24.49 kg/m , BMI Body mass index is 24.49 kg/m.  Wt Readings from Last 3 Encounters:  10/06/24 125 lb 6.4 oz (56.9 kg)  06/06/24 116 lb (52.6 kg)  02/02/24 118 lb 9.6 oz (53.8 kg)    General: Patient appears comfortable at rest. HEENT: Conjunctiva and lids normal. Neck: Supple, no elevated JVP or carotid bruits. Lungs: Clear to auscultation, nonlabored breathing at rest. Cardiac: Regular rate and rhythm, no S3, 1/6 systolic murmur. Extremities: No pitting edema.  ECG:  An ECG dated 10/20/2023 was personally reviewed today and demonstrated:  Sinus rhythm with leftward axis and nonspecific T wave changes.  Labwork: 10/04/2024: TSH 9.270   Other Studies Reviewed Today:  No interval cardiac testing for review today.  Assessment and Plan:  1.  Aortic regurgitation, moderate by echocardiogram in December 2024.  Aortic root mildly dilated at 39 mm.  LV chamber size normal.  She is asymptomatic.  Follow-up echocardiogram pending in December.   2.  Prior history of  cor pulmonale.  Echocardiogram in December 2024 showed normal RV contraction and normal estimated PASP.   3.  Coronary and aortic calcification evident by CT imaging.  Continue aspirin  81 mg daily and Lipitor 20 mg daily.  Requesting interval lab work from PCP.   4.  Primary hypertension.  Continue Calan  SR 240 mg daily and Cozaar 50 mg daily.  Disposition:  Follow up 1 year.  Signed, Michelle Wall, M.D., F.A.C.C. [facility]

## 2024-10-06 NOTE — Patient Instructions (Addendum)
 Medication Instructions:  Your physician recommends that you continue on your current medications as directed. Please refer to the Current Medication list given to you today.   Labwork: None today  Testing/Procedures: None today  Follow-Up: 1 year  Any Other Special Instructions Will Be Listed Below (If Applicable).  If you need a refill on your cardiac medications before your next appointment, please call your pharmacy.

## 2024-10-09 ENCOUNTER — Encounter: Payer: Self-pay | Admitting: Nurse Practitioner

## 2024-10-09 ENCOUNTER — Ambulatory Visit (INDEPENDENT_AMBULATORY_CARE_PROVIDER_SITE_OTHER): Admitting: Nurse Practitioner

## 2024-10-09 VITALS — BP 112/62 | HR 75 | Ht 61.0 in | Wt 125.4 lb

## 2024-10-09 DIAGNOSIS — E063 Autoimmune thyroiditis: Secondary | ICD-10-CM | POA: Diagnosis not present

## 2024-10-09 DIAGNOSIS — E039 Hypothyroidism, unspecified: Secondary | ICD-10-CM | POA: Diagnosis not present

## 2024-10-09 MED ORDER — LEVOTHYROXINE SODIUM 88 MCG PO TABS: 88.0000 ug | ORAL_TABLET | Freq: Every day | ORAL | 1 refills | Status: AC

## 2024-10-09 NOTE — Progress Notes (Signed)
 Endocrinology Follow Up Note                                         10/09/2024, 10:37 AM  Subjective:   Subjective    Michelle Wall is a 89 y.o.-year-old female patient being seen in follow up after being seen in consultation for hypothyroidism referred by Leigh Lung, MD.   Past Medical History:  Diagnosis Date   Aortic atherosclerosis    Carotid atherosclerosis    Cor pulmonale (HCC)    Coronary artery calcification seen on CT scan    Emphysema    Essential hypertension    Hypothyroidism    Pulmonary hypertension (HCC)    Tricuspid insufficiency     Past Surgical History:  Procedure Laterality Date   COLONOSCOPY N/A 12/14/2018   Procedure: COLONOSCOPY;  Surgeon: Golda Claudis PENNER, MD;  Location: [facility];  Service: Endoscopy;  Laterality: N/A;  1:00   COLOSTOMY Left 06/14/2018   Procedure: COLOSTOMY;  Surgeon: Kallie Manuelita BROCKS, MD;  Location: [facility];  Service: General;  Laterality: Left;   LAPAROTOMY N/A 08/09/2020   Procedure: EXPLORATORY LAPAROTOMY;  Surgeon: Kallie Manuelita BROCKS, MD;  Location: [facility];  Service: General;  Laterality: N/A;   LYSIS OF ADHESION N/A 08/09/2020   Procedure: LYSIS OF ADHESIONS;  Surgeon: Kallie Manuelita BROCKS, MD;  Location: [facility];  Service: General;  Laterality: N/A;   PARTIAL COLECTOMY N/A 06/14/2018   Procedure: PARTIAL COLECTOMY;  Surgeon: Kallie Manuelita BROCKS, MD;  Location: [facility];  Service: General;  Laterality: N/A;   POLYPECTOMY  12/14/2018   Procedure: POLYPECTOMY;  Surgeon: Golda Claudis PENNER, MD;  Location: [facility];  Service: Endoscopy;;  rectum   THYROIDECTOMY, PARTIAL     TUBAL LIGATION     midline scar ? possibly tubal ligation    Social History   Socioeconomic History   Marital status: Widowed    Spouse name: Not on file   Number of children: 8   Years of education: Not on file   Highest education level: Not on file  Occupational History   Not  on file  Tobacco Use   Smoking status: Every Day    Current packs/day: 0.00    Average packs/day: 0.8 packs/day for 60.0 years (45.0 ttl pk-yrs)    Types: Cigarettes    Start date: 08/08/1960    Last attempt to quit: 08/08/2020    Years since quitting: 4.1    Passive exposure: Never   Smokeless tobacco: Never  Vaping Use   Vaping status: Never Used  Substance and Sexual Activity   Alcohol use: No   Drug use: No   Sexual activity: Not on file  Other Topics Concern   Not on file  Social History Narrative   Not on file   Social Drivers of Health   Financial Resource Strain: Not on file  Food Insecurity: No Food Insecurity (08/17/2022)   Hunger Vital Sign    Worried About Running Out of Food in the  Last Year: Never true    Ran Out of Food in the Last Year: Never true  Transportation Needs: No Transportation Needs (08/17/2022)   PRAPARE - Administrator, Civil Service (Medical): No    Lack of Transportation (Non-Medical): No  Physical Activity: Not on file  Stress: Not on file  Social Connections: Not on file    History reviewed. No pertinent family history.  Outpatient Encounter Medications as of 10/09/2024  Medication Sig   aspirin  EC 81 MG tablet Take 1 tablet (81 mg total) by mouth daily with breakfast.   atorvastatin (LIPITOR) 20 MG tablet Take 20 mg by mouth at bedtime.   cholecalciferol (VITAMIN D3) 25 MCG (1000 UT) tablet Take 1,000 Units by mouth daily.   hydrochlorothiazide  (HYDRODIURIL ) 25 MG tablet Take 25 mg by mouth daily.   latanoprost  (XALATAN ) 0.005 % ophthalmic solution Place 1 drop into both eyes at bedtime.   losartan (COZAAR) 50 MG tablet Take 50 mg by mouth daily.   verapamil  (CALAN -SR) 240 MG CR tablet Take 240 mg by mouth daily.    [DISCONTINUED] levothyroxine  (SYNTHROID ) 75 MCG tablet Take 1 tablet (75 mcg total) by mouth daily before breakfast.   levothyroxine  (SYNTHROID ) 88 MCG tablet Take 1 tablet (88 mcg total) by mouth daily before  breakfast.   tiotropium (SPIRIVA  HANDIHALER) 18 MCG inhalation capsule Place 1 capsule (18 mcg total) into inhaler and inhale daily. (Patient not taking: Reported on 10/09/2024)   No facility-administered encounter medications on file as of 10/09/2024.    ALLERGIES: No Known Allergies VACCINATION STATUS: Immunization History  Administered Date(s) Administered   Fluad Quad(high Dose 65+) 08/12/2020   Pneumococcal Polysaccharide-23 06/16/2018     HPI   Michelle Wall  is a patient with the above medical history. she was diagnosed with hypothyroidism at approximate age of 58 years- she notes she had goiter and had partial thyroidectomy at that time (right side), which required subsequent initiation of thyroid  hormone replacement therapy. she was given various doses of Levothyroxine  over the years, currently on 100 micrograms. she reports compliance to this medication:  Taking it daily on empty stomach with water , separated by >30 minutes before breakfast and other medications, and by at least 4 hours from calcium , iron, PPIs, multivitamins .  She says she sometimes takes it with her other medications in the morning.  I reviewed patient's thyroid  tests:  Lab Results  Component Value Date   TSH 9.270 (H) 10/04/2024   TSH 5.570 (H) 06/01/2024   TSH 7.370 (H) 01/28/2024   TSH 0.029 (L) 10/25/2023   TSH <0.005 (L) 08/25/2023   TSH 0.40 (A) 03/12/2023   TSH 0.36 (A) 12/16/2020   TSH 0.777 08/08/2020   FREET4 1.09 10/04/2024   FREET4 1.12 06/01/2024   FREET4 1.00 01/28/2024   FREET4 1.48 10/25/2023   FREET4 2.47 (H) 08/25/2023     Patient denies any overt symptoms of over or under replacement.  Pt denies feeling nodules in neck, hoarseness, dysphagia/odynophagia, SOB with lying down.  she denies any known family history of thyroid  disorders.  No family history of thyroid  cancer.  She did have partial thyroidectomy for goiter in the past (right side). No history of radiation  therapy to head or neck.  No recent use of iodine supplements.  Denies use of Biotin containing supplements.  I reviewed her chart and she also has a history of GERD, HTN, COPD, ASCVD, smoker.  Review of systems  Constitutional: +stable body weight,  current  Body mass index is 23.69 kg/m. , no fatigue, no subjective hyperthermia, no subjective hypothermia Eyes: no blurry vision, no xerophthalmia ENT: no sore throat, no nodules palpated in throat, no dysphagia/odynophagia, no hoarseness Cardiovascular: no chest pain, no shortness of breath, no palpitations, no leg swelling Respiratory: no cough, no shortness of breath Gastrointestinal: no nausea/vomiting/diarrhea Musculoskeletal: + diffuse muscle/joint aches (arthritis), walks with cane Skin: no rashes, no hyperemia Neurological: no tremors, no numbness, no tingling, no dizziness Psychiatric: no depression, no anxiety   Objective:   Objective     BP 112/62 (BP Location: Right Arm, Patient Position: Sitting, Cuff Size: Large)   Pulse 75   Ht 5' 1 (1.549 m)   Wt 125 lb 6.4 oz (56.9 kg)   BMI 23.69 kg/m  Wt Readings from Last 3 Encounters:  10/09/24 125 lb 6.4 oz (56.9 kg)  10/06/24 125 lb 6.4 oz (56.9 kg)  06/06/24 116 lb (52.6 kg)    BP Readings from Last 3 Encounters:  10/09/24 112/62  10/06/24 135/60  06/06/24 128/60      Physical Exam- Limited  Constitutional:  Body mass index is 23.69 kg/m. , not in acute distress, normal state of mind Eyes:  EOMI, no exophthalmos Musculoskeletal: no gross deformities, strength intact in all four extremities, no gross restriction of joint movements, walks with cane for disequilibrium Skin:  no rashes, no hyperemia Neurological: no tremor with outstretched hands   CMP ( most recent) CMP     Component Value Date/Time   NA 143 08/19/2020 0550   K 4.2 08/19/2020 0550   CL 109 08/19/2020 0550   CO2 24 08/19/2020 0550   GLUCOSE 120 (H) 08/19/2020 0550   BUN 18 08/19/2020  0550   CREATININE 0.67 08/19/2020 0550   CALCIUM  8.6 (L) 08/19/2020 0550   PROT 6.2 (L) 08/19/2020 0550   ALBUMIN 2.5 (L) 08/19/2020 0550   AST 119 (H) 08/19/2020 0550   ALT 154 (H) 08/19/2020 0550   ALKPHOS 43 08/19/2020 0550   BILITOT 0.4 08/19/2020 0550   GFRNONAA >60 08/19/2020 0550     Diabetic Labs (most recent): Lab Results  Component Value Date   HGBA1C 5.2 08/08/2020     Lipid Panel ( most recent) Lipid Panel     Component Value Date/Time   TRIG 88 08/19/2020 0550       Lab Results  Component Value Date   TSH 9.270 (H) 10/04/2024   TSH 5.570 (H) 06/01/2024   TSH 7.370 (H) 01/28/2024   TSH 0.029 (L) 10/25/2023   TSH <0.005 (L) 08/25/2023   TSH 0.40 (A) 03/12/2023   TSH 0.36 (A) 12/16/2020   TSH 0.777 08/08/2020   FREET4 1.09 10/04/2024   FREET4 1.12 06/01/2024   FREET4 1.00 01/28/2024   FREET4 1.48 10/25/2023   FREET4 2.47 (H) 08/25/2023     Latest Reference Range & Units 01/28/24 08:53 06/01/24 09:44 10/04/24 11:27  TSH 0.450 - 4.500 uIU/mL 7.370 (H) 5.570 (H) 9.270 (H)  T4,Free(Direct) 0.82 - 1.77 ng/dL 8.99 8.87 8.90  (H): Data is abnormally high  Assessment & Plan:   ASSESSMENT / PLAN:  1. Hypothyroidism-r/t Hashimotos thyroiditis  Positive thyroid  antibodies are suggestive of autoimmune thyroid  dysfunction.  Patient with long-standing hypothyroidism, on levothyroxine  therapy. On physical exam, patient does not have gross goiter, thyroid  nodules, or neck compression symptoms.   Her previsit labs are consistent with under-replacement.  She is advised to increase her Levothyroxine  to 88 mcg po daily before breakfast (closer to her weight based  maximum dose of 91 mcg).  She is aware of signs of over-replacement to watch for and to give me a call if she experiences those.   - The correct intake of thyroid  hormone (Levothyroxine , Synthroid ), is on empty stomach first thing in the morning, with water , separated by at least 30 minutes from breakfast  and other medications,  and separated by more than 4 hours from calcium , iron, multivitamins, acid reflux medications (PPIs).  - This medication is a life-long medication and will be needed to correct thyroid  hormone imbalances for the rest of your life.  The dose may change from time to time, based on thyroid  blood work.  - It is extremely important to be consistent taking this medication, near the same time each morning.  -AVOID TAKING PRODUCTS CONTAINING BIOTIN (commonly found in Hair, Skin, Nails vitamins) AS IT INTERFERES WITH THE VALIDITY OF THYROID  FUNCTION BLOOD TESTS.    -Due to absence of clinical goiter, no need for thyroid  ultrasound.    I spent  16  minutes in the care of the patient today including review of labs from Thyroid  Function, CMP, and other relevant labs ; imaging/biopsy records (current and previous including abstractions from other facilities); face-to-face time discussing  her lab results and symptoms, medications doses, her options of short and long term treatment based on the latest standards of care / guidelines;   and documenting the encounter.  [employer]  participated in the discussions, expressed understanding, and voiced agreement with the above plans.  All questions were answered to her satisfaction. she is encouraged to contact clinic should she have any questions or concerns prior to her return visit.   FOLLOW UP PLAN:  Return in about 4 months (around 02/06/2025) for Thyroid  follow up, Previsit labs.  Benton Rio, [facility] [address] [place], [place] [address] Phone: [phone] Fax: [phone]  10/09/2024, 10:37 AM

## 2024-10-09 NOTE — Patient Instructions (Signed)

## 2024-10-27 ENCOUNTER — Ambulatory Visit (HOSPITAL_COMMUNITY): Admission: RE | Admit: 2024-10-27 | Discharge: 2024-10-27 | Attending: Cardiology | Admitting: Cardiology

## 2024-10-27 ENCOUNTER — Ambulatory Visit: Payer: Self-pay | Admitting: Cardiology

## 2024-10-27 DIAGNOSIS — I38 Endocarditis, valve unspecified: Secondary | ICD-10-CM

## 2024-10-27 LAB — ECHOCARDIOGRAM COMPLETE
Area-P 1/2: 2.07 cm2
P 1/2 time: 469 ms
S' Lateral: 3.1 cm

## 2024-10-27 NOTE — Progress Notes (Signed)
*  PRELIMINARY RESULTS* Echocardiogram 2D Echocardiogram has been performed.  Michelle Wall 10/27/2024, 4:03 PM

## 2025-02-08 ENCOUNTER — Ambulatory Visit: Admitting: Nurse Practitioner
